# Patient Record
Sex: Female | Born: 1970 | Race: White | Hispanic: No | Marital: Married | Smoking: Never smoker
Health system: Southern US, Community
[De-identification: ages and names within clinical notes are randomized; demographics above are authoritative.]

## PROBLEM LIST (undated history)

## (undated) DIAGNOSIS — R112 Nausea with vomiting, unspecified: Secondary | ICD-10-CM

## (undated) DIAGNOSIS — M1990 Unspecified osteoarthritis, unspecified site: Secondary | ICD-10-CM

## (undated) DIAGNOSIS — K589 Irritable bowel syndrome without diarrhea: Secondary | ICD-10-CM

## (undated) DIAGNOSIS — I10 Essential (primary) hypertension: Secondary | ICD-10-CM

## (undated) DIAGNOSIS — Z9889 Other specified postprocedural states: Secondary | ICD-10-CM

## (undated) DIAGNOSIS — K219 Gastro-esophageal reflux disease without esophagitis: Secondary | ICD-10-CM

## (undated) DIAGNOSIS — E785 Hyperlipidemia, unspecified: Secondary | ICD-10-CM

## (undated) DIAGNOSIS — F32A Depression, unspecified: Secondary | ICD-10-CM

## (undated) DIAGNOSIS — Z8489 Family history of other specified conditions: Secondary | ICD-10-CM

## (undated) DIAGNOSIS — T4145XA Adverse effect of unspecified anesthetic, initial encounter: Secondary | ICD-10-CM

## (undated) DIAGNOSIS — F419 Anxiety disorder, unspecified: Secondary | ICD-10-CM

## (undated) DIAGNOSIS — T8859XA Other complications of anesthesia, initial encounter: Secondary | ICD-10-CM

## (undated) HISTORY — PX: UPPER GASTROINTESTINAL ENDOSCOPY: SHX188

## (undated) HISTORY — PX: CARPAL TUNNEL RELEASE: SHX101

## (undated) HISTORY — PX: COLONOSCOPY: SHX174

## (undated) HISTORY — DX: Hyperlipidemia, unspecified: E78.5

## (undated) HISTORY — PX: TRIGGER FINGER RELEASE: SHX641

## (undated) HISTORY — DX: Gastro-esophageal reflux disease without esophagitis: K21.9

## (undated) HISTORY — DX: Essential (primary) hypertension: I10

## (undated) HISTORY — DX: Irritable bowel syndrome, unspecified: K58.9

---
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
## 1995-12-08 HISTORY — PX: CHOLECYSTECTOMY: SHX55

## 1998-12-07 HISTORY — PX: LIVER SURGERY: SHX698

## 1999-07-15 ENCOUNTER — Encounter: Payer: Self-pay | Admitting: Family Medicine

## 1999-07-15 ENCOUNTER — Ambulatory Visit (HOSPITAL_COMMUNITY): Admission: RE | Admit: 1999-07-15 | Discharge: 1999-07-15 | Payer: Self-pay | Admitting: Family Medicine

## 1999-08-18 ENCOUNTER — Inpatient Hospital Stay (HOSPITAL_COMMUNITY): Admission: RE | Admit: 1999-08-18 | Discharge: 1999-08-25 | Payer: Self-pay | Admitting: *Deleted

## 2000-01-13 ENCOUNTER — Encounter: Admission: RE | Admit: 2000-01-13 | Discharge: 2000-01-13 | Payer: Self-pay | Admitting: *Deleted

## 2001-07-01 ENCOUNTER — Encounter: Payer: Self-pay | Admitting: Internal Medicine

## 2001-07-01 ENCOUNTER — Ambulatory Visit (HOSPITAL_COMMUNITY): Admission: RE | Admit: 2001-07-01 | Discharge: 2001-07-01 | Payer: Self-pay | Admitting: Internal Medicine

## 2001-09-23 ENCOUNTER — Ambulatory Visit (HOSPITAL_COMMUNITY): Admission: RE | Admit: 2001-09-23 | Discharge: 2001-09-23 | Payer: Self-pay | Admitting: Internal Medicine

## 2002-11-24 ENCOUNTER — Encounter (HOSPITAL_COMMUNITY): Admission: RE | Admit: 2002-11-24 | Discharge: 2002-12-24 | Payer: Self-pay | Admitting: Pulmonary Disease

## 2002-12-05 ENCOUNTER — Ambulatory Visit (HOSPITAL_COMMUNITY): Admission: RE | Admit: 2002-12-05 | Discharge: 2002-12-05 | Payer: Self-pay | Admitting: *Deleted

## 2002-12-07 HISTORY — PX: HERNIA REPAIR: SHX51

## 2003-06-01 ENCOUNTER — Ambulatory Visit (HOSPITAL_COMMUNITY): Admission: RE | Admit: 2003-06-01 | Discharge: 2003-06-01 | Payer: Self-pay | Admitting: Pulmonary Disease

## 2004-01-09 ENCOUNTER — Ambulatory Visit (HOSPITAL_COMMUNITY): Admission: RE | Admit: 2004-01-09 | Discharge: 2004-01-09 | Payer: Self-pay | Admitting: *Deleted

## 2004-01-21 ENCOUNTER — Ambulatory Visit (HOSPITAL_COMMUNITY): Admission: RE | Admit: 2004-01-21 | Discharge: 2004-01-21 | Payer: Self-pay | Admitting: *Deleted

## 2004-02-20 ENCOUNTER — Ambulatory Visit (HOSPITAL_COMMUNITY): Admission: RE | Admit: 2004-02-20 | Discharge: 2004-02-20 | Payer: Self-pay | Admitting: General Surgery

## 2004-08-04 ENCOUNTER — Ambulatory Visit (HOSPITAL_COMMUNITY): Admission: RE | Admit: 2004-08-04 | Discharge: 2004-08-04 | Payer: Self-pay | Admitting: General Surgery

## 2004-10-03 ENCOUNTER — Ambulatory Visit (HOSPITAL_COMMUNITY): Admission: RE | Admit: 2004-10-03 | Discharge: 2004-10-03 | Payer: Self-pay | Admitting: Internal Medicine

## 2005-03-21 ENCOUNTER — Emergency Department (HOSPITAL_COMMUNITY): Admission: EM | Admit: 2005-03-21 | Discharge: 2005-03-21 | Payer: Self-pay | Admitting: Family Medicine

## 2005-04-10 ENCOUNTER — Ambulatory Visit (HOSPITAL_COMMUNITY): Admission: RE | Admit: 2005-04-10 | Discharge: 2005-04-10 | Payer: Self-pay | Admitting: Neurosurgery

## 2005-06-23 ENCOUNTER — Ambulatory Visit: Payer: Self-pay | Admitting: Family Medicine

## 2005-07-14 ENCOUNTER — Ambulatory Visit: Payer: Self-pay | Admitting: Family Medicine

## 2005-08-14 ENCOUNTER — Ambulatory Visit: Payer: Self-pay | Admitting: Family Medicine

## 2005-09-08 ENCOUNTER — Encounter: Admission: RE | Admit: 2005-09-08 | Discharge: 2005-09-08 | Payer: Self-pay | Admitting: Neurosurgery

## 2005-09-08 ENCOUNTER — Ambulatory Visit: Payer: Self-pay | Admitting: Psychiatry

## 2005-09-13 ENCOUNTER — Encounter: Admission: RE | Admit: 2005-09-13 | Discharge: 2005-09-13 | Payer: Self-pay | Admitting: Neurosurgery

## 2005-10-27 ENCOUNTER — Encounter: Admission: RE | Admit: 2005-10-27 | Discharge: 2005-10-27 | Payer: Self-pay | Admitting: Neurology

## 2005-10-27 ENCOUNTER — Ambulatory Visit: Payer: Self-pay | Admitting: Family Medicine

## 2005-11-05 ENCOUNTER — Encounter (HOSPITAL_COMMUNITY): Admission: RE | Admit: 2005-11-05 | Discharge: 2005-11-17 | Payer: Self-pay | Admitting: Neurology

## 2005-11-24 ENCOUNTER — Ambulatory Visit: Payer: Self-pay | Admitting: Family Medicine

## 2005-12-09 ENCOUNTER — Encounter (HOSPITAL_COMMUNITY): Admission: RE | Admit: 2005-12-09 | Discharge: 2006-01-08 | Payer: Self-pay | Admitting: Neurology

## 2005-12-22 ENCOUNTER — Ambulatory Visit: Payer: Self-pay | Admitting: Family Medicine

## 2006-01-05 ENCOUNTER — Ambulatory Visit (HOSPITAL_COMMUNITY): Admission: RE | Admit: 2006-01-05 | Discharge: 2006-01-05 | Payer: Self-pay | Admitting: Family Medicine

## 2006-01-07 ENCOUNTER — Ambulatory Visit: Payer: Self-pay | Admitting: Family Medicine

## 2006-01-15 ENCOUNTER — Ambulatory Visit: Payer: Self-pay | Admitting: Family Medicine

## 2006-01-21 ENCOUNTER — Ambulatory Visit: Payer: Self-pay | Admitting: Family Medicine

## 2006-01-25 ENCOUNTER — Encounter: Admission: RE | Admit: 2006-01-25 | Discharge: 2006-01-25 | Payer: Self-pay | Admitting: Neurology

## 2006-03-10 ENCOUNTER — Ambulatory Visit: Payer: Self-pay | Admitting: Family Medicine

## 2006-06-18 ENCOUNTER — Ambulatory Visit: Payer: Self-pay | Admitting: Gastroenterology

## 2006-07-07 ENCOUNTER — Encounter (INDEPENDENT_AMBULATORY_CARE_PROVIDER_SITE_OTHER): Payer: Self-pay | Admitting: Family Medicine

## 2006-07-07 LAB — CONVERTED CEMR LAB: Pap Smear: NORMAL

## 2006-07-16 ENCOUNTER — Ambulatory Visit: Payer: Self-pay | Admitting: Gastroenterology

## 2006-10-08 ENCOUNTER — Ambulatory Visit: Payer: Self-pay | Admitting: Gastroenterology

## 2006-10-13 ENCOUNTER — Ambulatory Visit (HOSPITAL_COMMUNITY): Admission: RE | Admit: 2006-10-13 | Discharge: 2006-10-13 | Payer: Self-pay | Admitting: Gastroenterology

## 2006-10-20 ENCOUNTER — Emergency Department (HOSPITAL_COMMUNITY): Admission: EM | Admit: 2006-10-20 | Discharge: 2006-10-20 | Payer: Self-pay | Admitting: Emergency Medicine

## 2006-11-08 ENCOUNTER — Ambulatory Visit: Payer: Self-pay | Admitting: Family Medicine

## 2006-11-09 ENCOUNTER — Encounter (INDEPENDENT_AMBULATORY_CARE_PROVIDER_SITE_OTHER): Payer: Self-pay | Admitting: Family Medicine

## 2006-11-09 LAB — CONVERTED CEMR LAB: TSH: 1.462 microintl units/mL

## 2006-11-10 ENCOUNTER — Encounter: Payer: Self-pay | Admitting: Family Medicine

## 2006-11-10 DIAGNOSIS — F3289 Other specified depressive episodes: Secondary | ICD-10-CM | POA: Insufficient documentation

## 2006-11-10 DIAGNOSIS — G5600 Carpal tunnel syndrome, unspecified upper limb: Secondary | ICD-10-CM | POA: Insufficient documentation

## 2006-11-10 DIAGNOSIS — F329 Major depressive disorder, single episode, unspecified: Secondary | ICD-10-CM

## 2006-11-10 DIAGNOSIS — K589 Irritable bowel syndrome without diarrhea: Secondary | ICD-10-CM | POA: Insufficient documentation

## 2006-11-10 DIAGNOSIS — J309 Allergic rhinitis, unspecified: Secondary | ICD-10-CM | POA: Insufficient documentation

## 2006-11-10 DIAGNOSIS — F411 Generalized anxiety disorder: Secondary | ICD-10-CM | POA: Insufficient documentation

## 2006-11-10 DIAGNOSIS — E785 Hyperlipidemia, unspecified: Secondary | ICD-10-CM | POA: Insufficient documentation

## 2006-11-10 DIAGNOSIS — I10 Essential (primary) hypertension: Secondary | ICD-10-CM | POA: Insufficient documentation

## 2006-11-10 DIAGNOSIS — G43909 Migraine, unspecified, not intractable, without status migrainosus: Secondary | ICD-10-CM | POA: Insufficient documentation

## 2006-11-10 DIAGNOSIS — K219 Gastro-esophageal reflux disease without esophagitis: Secondary | ICD-10-CM | POA: Insufficient documentation

## 2006-12-03 ENCOUNTER — Ambulatory Visit (HOSPITAL_COMMUNITY): Admission: RE | Admit: 2006-12-03 | Discharge: 2006-12-03 | Payer: Self-pay | Admitting: Gastroenterology

## 2006-12-03 ENCOUNTER — Ambulatory Visit: Payer: Self-pay | Admitting: Gastroenterology

## 2006-12-23 ENCOUNTER — Ambulatory Visit: Payer: Self-pay | Admitting: Gastroenterology

## 2006-12-27 ENCOUNTER — Ambulatory Visit: Payer: Self-pay | Admitting: Family Medicine

## 2007-01-06 ENCOUNTER — Encounter (INDEPENDENT_AMBULATORY_CARE_PROVIDER_SITE_OTHER): Payer: Self-pay | Admitting: Family Medicine

## 2007-01-06 LAB — CONVERTED CEMR LAB
Basophils Absolute: 0.1 10*3/uL (ref 0.0–0.1)
Basophils Relative: 1 % (ref 0–1)
Cholesterol: 206 mg/dL — ABNORMAL HIGH (ref 0–200)
Creatinine, Urine: 218.9 mg/dL
Eosinophils Absolute: 0.2 10*3/uL (ref 0.0–0.7)
Eosinophils Relative: 3 % (ref 0–5)
HCT: 43.6 % (ref 36.0–46.0)
HDL: 40 mg/dL (ref >39)
Hemoglobin: 14.3 g/dL (ref 12.0–15.0)
LDL Cholesterol: 120 mg/dL — ABNORMAL HIGH (ref 0–99)
Lymphocytes Relative: 35 % (ref 12–46)
Lymphs Abs: 2.6 10*3/uL (ref 0.7–3.3)
MCHC: 32.8 g/dL (ref 30.0–36.0)
MCV: 92.2 fL (ref 78.0–100.0)
Microalb Creat Ratio: 5.9 mg/g (ref 0.0–30.0)
Microalb, Ur: 1.3 mg/dL (ref 0.00–1.89)
Monocytes Absolute: 0.5 10*3/uL (ref 0.2–0.7)
Monocytes Relative: 7 % (ref 3–11)
Neutro Abs: 3.9 10*3/uL (ref 1.7–7.7)
Neutrophils Relative %: 54 % (ref 43–77)
Platelets: 303 10*3/uL (ref 150–400)
RBC: 4.73 M/uL (ref 3.87–5.11)
RDW: 12.2 % (ref 11.5–14.0)
Total CHOL/HDL Ratio: 5.2
Triglycerides: 232 mg/dL — ABNORMAL HIGH (ref <150)
VLDL: 46 mg/dL — ABNORMAL HIGH (ref 0–40)
WBC: 7.3 10*3/uL (ref 4.0–10.5)

## 2007-01-10 ENCOUNTER — Ambulatory Visit: Payer: Self-pay | Admitting: Family Medicine

## 2007-01-24 ENCOUNTER — Telehealth (INDEPENDENT_AMBULATORY_CARE_PROVIDER_SITE_OTHER): Payer: Self-pay | Admitting: Family Medicine

## 2007-01-29 IMAGING — CR DG CHEST 2V
2 series · 2 of 2 positions shown · non-contrast
Comparison: none

CLINICAL DATA: Preoperative respiratory exam for carpal tunnel surgery. 
 CHEST - 2 VIEW:

[view not recorded (1 of 2)]
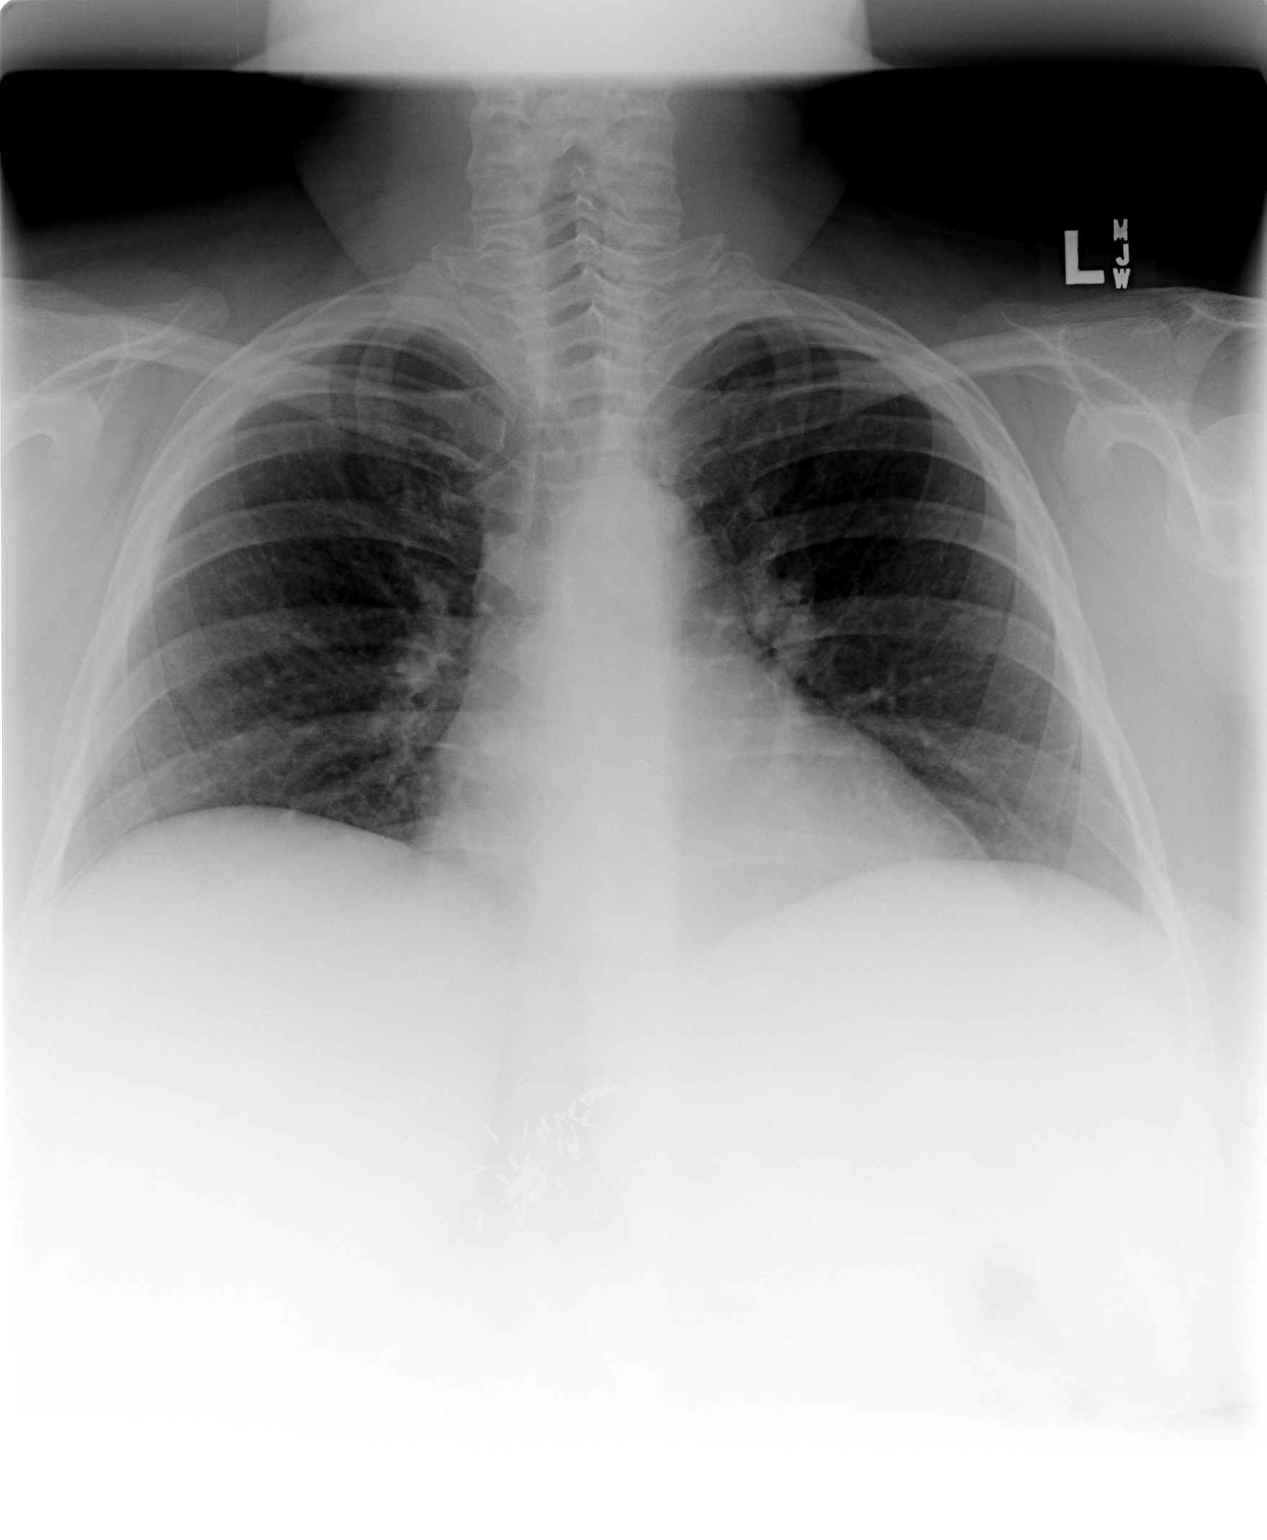

[view not recorded (2 of 2)]
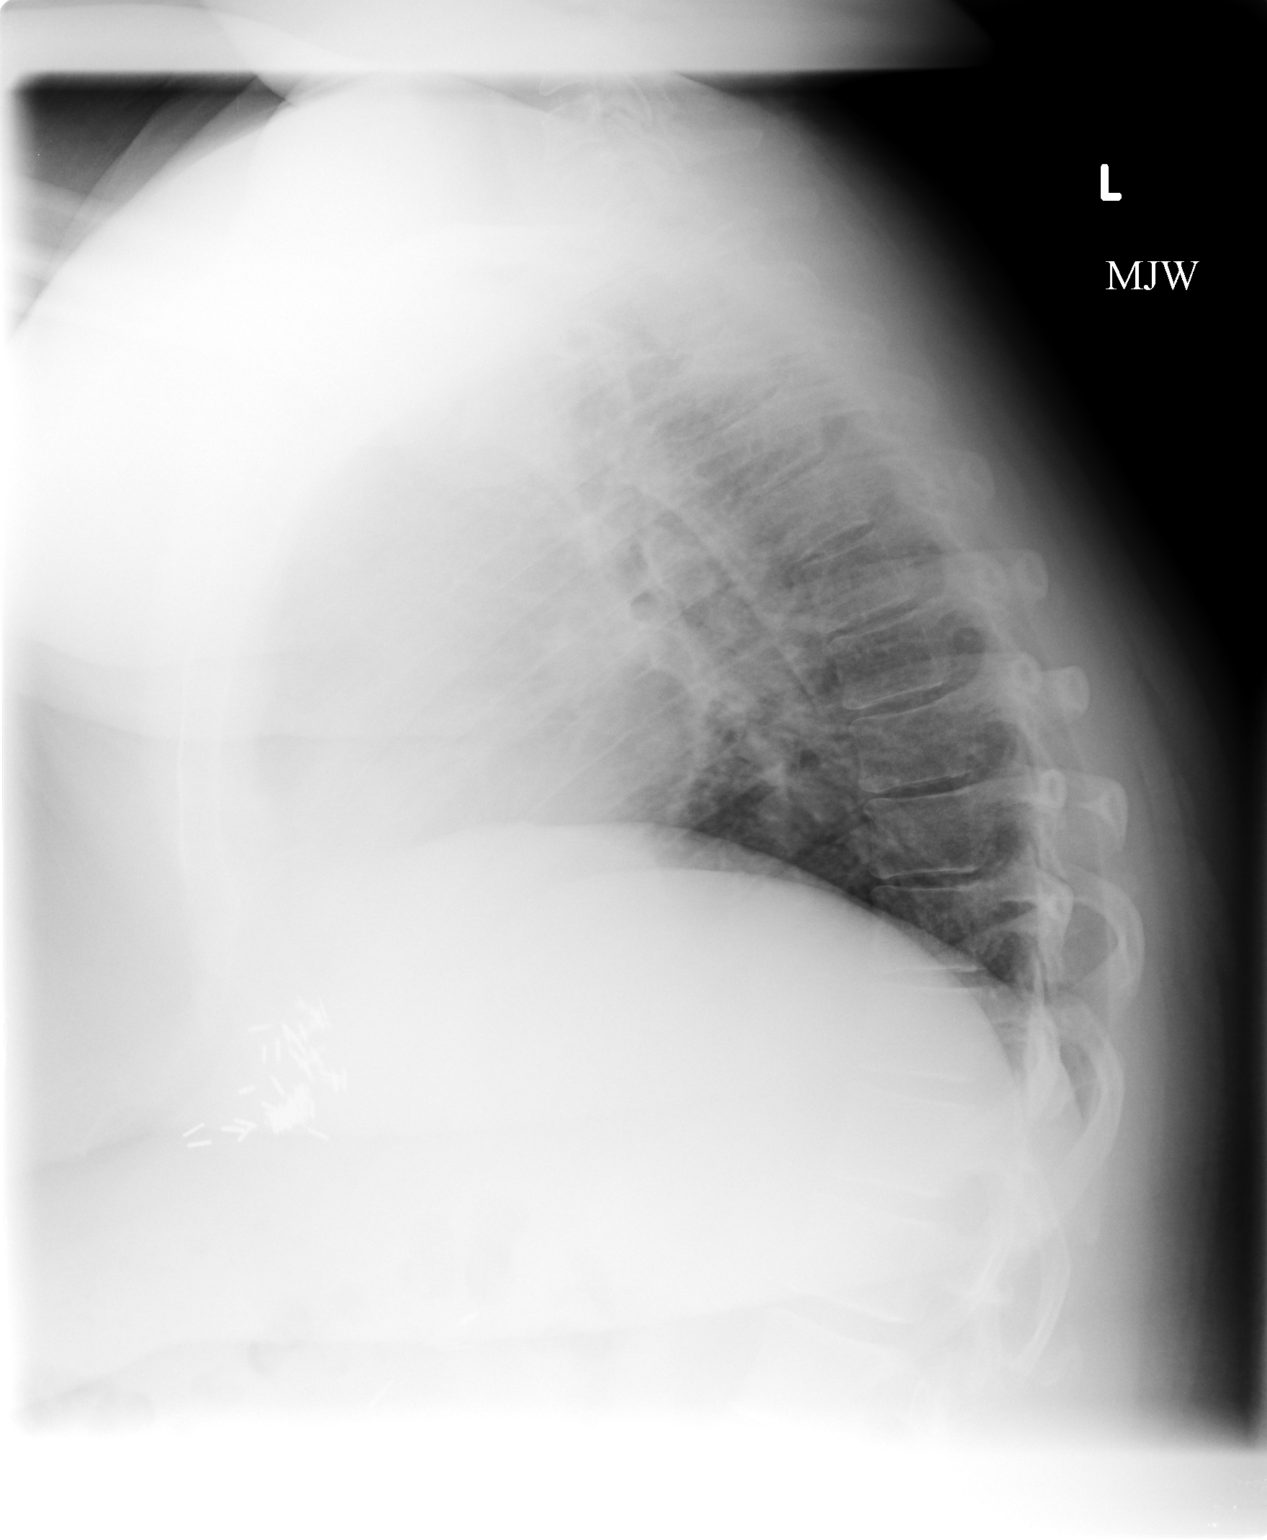

[2 of 2 positions shown; findings below may reference images not displayed]

FINDINGS: The cardiomediastinal silhouette is unremarkable.  The lungs appear clear.  The visualized bony thorax appears intact.
IMPRESSION: Normal chest.

## 2007-02-21 ENCOUNTER — Ambulatory Visit: Payer: Self-pay | Admitting: Family Medicine

## 2007-02-21 LAB — CONVERTED CEMR LAB
Cholesterol, target level: 200 mg/dL
HDL goal, serum: 40 mg/dL
LDL Goal: 100 mg/dL

## 2007-02-22 ENCOUNTER — Encounter (INDEPENDENT_AMBULATORY_CARE_PROVIDER_SITE_OTHER): Payer: Self-pay | Admitting: Family Medicine

## 2007-02-23 LAB — CONVERTED CEMR LAB
ALT: 28 units/L (ref 0–35)
AST: 21 units/L (ref 0–37)
Albumin: 4.1 g/dL (ref 3.5–5.2)
Alkaline Phosphatase: 60 units/L (ref 39–117)
BUN: 14 mg/dL (ref 6–23)
CO2: 18 meq/L — ABNORMAL LOW (ref 19–32)
Calcium: 9.2 mg/dL (ref 8.4–10.5)
Chloride: 106 meq/L (ref 96–112)
Creatinine, Ser: 0.84 mg/dL (ref 0.40–1.20)
Glucose, Bld: 126 mg/dL — ABNORMAL HIGH (ref 70–99)
Potassium: 4.3 meq/L (ref 3.5–5.3)
Sodium: 139 meq/L (ref 135–145)
Total Bilirubin: 0.4 mg/dL (ref 0.3–1.2)
Total Protein: 7.1 g/dL (ref 6.0–8.3)

## 2007-02-28 ENCOUNTER — Encounter (INDEPENDENT_AMBULATORY_CARE_PROVIDER_SITE_OTHER): Payer: Self-pay | Admitting: Family Medicine

## 2007-03-15 ENCOUNTER — Encounter (INDEPENDENT_AMBULATORY_CARE_PROVIDER_SITE_OTHER): Payer: Self-pay | Admitting: Family Medicine

## 2007-03-25 ENCOUNTER — Telehealth (INDEPENDENT_AMBULATORY_CARE_PROVIDER_SITE_OTHER): Payer: Self-pay | Admitting: *Deleted

## 2007-03-29 ENCOUNTER — Encounter (INDEPENDENT_AMBULATORY_CARE_PROVIDER_SITE_OTHER): Payer: Self-pay | Admitting: Family Medicine

## 2007-04-05 ENCOUNTER — Ambulatory Visit: Payer: Self-pay | Admitting: Family Medicine

## 2007-04-05 LAB — CONVERTED CEMR LAB: Blood Glucose, Fingerstick: 135

## 2007-04-07 ENCOUNTER — Encounter (INDEPENDENT_AMBULATORY_CARE_PROVIDER_SITE_OTHER): Payer: Self-pay | Admitting: Family Medicine

## 2007-06-06 ENCOUNTER — Ambulatory Visit: Payer: Self-pay | Admitting: Family Medicine

## 2007-06-06 ENCOUNTER — Ambulatory Visit (HOSPITAL_COMMUNITY): Admission: RE | Admit: 2007-06-06 | Discharge: 2007-06-06 | Payer: Self-pay | Admitting: Family Medicine

## 2007-06-06 LAB — CONVERTED CEMR LAB
ALT: 28 U/L
AST: 26 U/L
Albumin: 3.8 g/dL
Alkaline Phosphatase: 62 U/L
Amylase: 40 U/L
BUN: 11 mg/dL
Basophils Absolute: 0 10*3/uL
Basophils Relative: 0 %
Beta hcg, urine, semiquantitative: NEGATIVE
CO2: 26 meq/L
Calcium: 9.5 mg/dL
Chloride: 101 meq/L
Creatinine, Ser: 0.75 mg/dL
Eosinophils Absolute: 0.2 10*3/uL
Eosinophils Relative: 2 %
Glucose, Bld: 195 mg/dL — ABNORMAL HIGH
Glucose, Urine, Semiquant: NEGATIVE
HCT: 42.1 %
Hemoglobin: 14.7 g/dL
Ketones, urine, test strip: NEGATIVE
Lipase: 18 U/L
Lymphocytes Relative: 26 %
Lymphs Abs: 3 10*3/uL
MCHC: 34.9 g/dL
MCV: 86.8 fL
Monocytes Absolute: 0.7 10*3/uL
Monocytes Relative: 6 %
Neutro Abs: 7.5 10*3/uL
Neutrophils Relative %: 66 %
Platelets: 289 10*3/uL
Potassium: 4.3 meq/L
RBC: 4.86 M/uL
RDW: 12.2 %
Sodium: 135 meq/L
Total Bilirubin: 0.6 mg/dL
Total Protein: 6.9 g/dL
WBC: 11.4 10*3/uL — ABNORMAL HIGH

## 2007-06-07 ENCOUNTER — Telehealth (INDEPENDENT_AMBULATORY_CARE_PROVIDER_SITE_OTHER): Payer: Self-pay | Admitting: *Deleted

## 2007-06-08 ENCOUNTER — Ambulatory Visit: Payer: Self-pay | Admitting: Family Medicine

## 2007-06-13 ENCOUNTER — Telehealth (INDEPENDENT_AMBULATORY_CARE_PROVIDER_SITE_OTHER): Payer: Self-pay | Admitting: *Deleted

## 2007-06-17 ENCOUNTER — Encounter (INDEPENDENT_AMBULATORY_CARE_PROVIDER_SITE_OTHER): Payer: Self-pay | Admitting: Family Medicine

## 2007-06-19 ENCOUNTER — Encounter (INDEPENDENT_AMBULATORY_CARE_PROVIDER_SITE_OTHER): Payer: Self-pay | Admitting: Family Medicine

## 2007-06-20 ENCOUNTER — Encounter (INDEPENDENT_AMBULATORY_CARE_PROVIDER_SITE_OTHER): Payer: Self-pay | Admitting: Family Medicine

## 2007-06-21 ENCOUNTER — Telehealth (INDEPENDENT_AMBULATORY_CARE_PROVIDER_SITE_OTHER): Payer: Self-pay | Admitting: Family Medicine

## 2007-06-22 ENCOUNTER — Ambulatory Visit: Payer: Self-pay | Admitting: Gastroenterology

## 2007-06-22 ENCOUNTER — Telehealth (INDEPENDENT_AMBULATORY_CARE_PROVIDER_SITE_OTHER): Payer: Self-pay | Admitting: Family Medicine

## 2007-06-22 ENCOUNTER — Encounter (INDEPENDENT_AMBULATORY_CARE_PROVIDER_SITE_OTHER): Payer: Self-pay | Admitting: Family Medicine

## 2007-07-13 ENCOUNTER — Ambulatory Visit: Payer: Self-pay | Admitting: Gastroenterology

## 2007-07-18 ENCOUNTER — Ambulatory Visit: Payer: Self-pay | Admitting: Family Medicine

## 2007-07-19 ENCOUNTER — Telehealth (INDEPENDENT_AMBULATORY_CARE_PROVIDER_SITE_OTHER): Payer: Self-pay | Admitting: *Deleted

## 2007-07-19 ENCOUNTER — Encounter (INDEPENDENT_AMBULATORY_CARE_PROVIDER_SITE_OTHER): Payer: Self-pay | Admitting: Family Medicine

## 2007-07-22 ENCOUNTER — Telehealth (INDEPENDENT_AMBULATORY_CARE_PROVIDER_SITE_OTHER): Payer: Self-pay | Admitting: *Deleted

## 2007-07-22 ENCOUNTER — Encounter (HOSPITAL_COMMUNITY): Admission: RE | Admit: 2007-07-22 | Discharge: 2007-08-21 | Payer: Self-pay | Admitting: Family Medicine

## 2007-07-25 ENCOUNTER — Encounter (INDEPENDENT_AMBULATORY_CARE_PROVIDER_SITE_OTHER): Payer: Self-pay | Admitting: Family Medicine

## 2007-08-13 ENCOUNTER — Encounter (INDEPENDENT_AMBULATORY_CARE_PROVIDER_SITE_OTHER): Payer: Self-pay | Admitting: Family Medicine

## 2007-08-15 ENCOUNTER — Encounter (INDEPENDENT_AMBULATORY_CARE_PROVIDER_SITE_OTHER): Payer: Self-pay | Admitting: Family Medicine

## 2007-08-19 ENCOUNTER — Encounter (INDEPENDENT_AMBULATORY_CARE_PROVIDER_SITE_OTHER): Payer: Self-pay | Admitting: Family Medicine

## 2007-08-22 ENCOUNTER — Encounter (INDEPENDENT_AMBULATORY_CARE_PROVIDER_SITE_OTHER): Payer: Self-pay | Admitting: Family Medicine

## 2007-08-23 ENCOUNTER — Ambulatory Visit: Payer: Self-pay | Admitting: Family Medicine

## 2007-08-23 DIAGNOSIS — E1165 Type 2 diabetes mellitus with hyperglycemia: Secondary | ICD-10-CM

## 2007-08-23 DIAGNOSIS — IMO0001 Reserved for inherently not codable concepts without codable children: Secondary | ICD-10-CM | POA: Insufficient documentation

## 2007-08-23 LAB — CONVERTED CEMR LAB: Hgb A1c MFr Bld: 9.1 %

## 2007-08-29 ENCOUNTER — Telehealth (INDEPENDENT_AMBULATORY_CARE_PROVIDER_SITE_OTHER): Payer: Self-pay | Admitting: Family Medicine

## 2007-09-09 ENCOUNTER — Telehealth (INDEPENDENT_AMBULATORY_CARE_PROVIDER_SITE_OTHER): Payer: Self-pay | Admitting: Family Medicine

## 2007-09-12 ENCOUNTER — Encounter (INDEPENDENT_AMBULATORY_CARE_PROVIDER_SITE_OTHER): Payer: Self-pay | Admitting: Family Medicine

## 2007-09-30 ENCOUNTER — Encounter (INDEPENDENT_AMBULATORY_CARE_PROVIDER_SITE_OTHER): Payer: Self-pay | Admitting: Family Medicine

## 2007-10-21 ENCOUNTER — Telehealth (INDEPENDENT_AMBULATORY_CARE_PROVIDER_SITE_OTHER): Payer: Self-pay | Admitting: *Deleted

## 2007-10-25 ENCOUNTER — Ambulatory Visit: Payer: Self-pay | Admitting: Family Medicine

## 2007-11-21 ENCOUNTER — Telehealth (INDEPENDENT_AMBULATORY_CARE_PROVIDER_SITE_OTHER): Payer: Self-pay | Admitting: *Deleted

## 2007-12-06 ENCOUNTER — Telehealth (INDEPENDENT_AMBULATORY_CARE_PROVIDER_SITE_OTHER): Payer: Self-pay | Admitting: *Deleted

## 2007-12-06 ENCOUNTER — Ambulatory Visit: Payer: Self-pay | Admitting: Family Medicine

## 2007-12-08 HISTORY — PX: CERVICAL CONE BIOPSY: SUR198

## 2007-12-08 HISTORY — PX: DILATION AND CURETTAGE OF UTERUS: SHX78

## 2007-12-16 ENCOUNTER — Encounter (INDEPENDENT_AMBULATORY_CARE_PROVIDER_SITE_OTHER): Payer: Self-pay | Admitting: Family Medicine

## 2007-12-23 ENCOUNTER — Ambulatory Visit: Payer: Self-pay | Admitting: Family Medicine

## 2008-02-07 ENCOUNTER — Encounter (INDEPENDENT_AMBULATORY_CARE_PROVIDER_SITE_OTHER): Payer: Self-pay | Admitting: Family Medicine

## 2008-02-08 ENCOUNTER — Telehealth (INDEPENDENT_AMBULATORY_CARE_PROVIDER_SITE_OTHER): Payer: Self-pay | Admitting: *Deleted

## 2008-02-08 LAB — CONVERTED CEMR LAB
Albumin: 4.2 g/dL (ref 3.5–5.2)
BUN: 9 mg/dL (ref 6–23)
Calcium: 9.6 mg/dL (ref 8.4–10.5)
Chloride: 102 meq/L (ref 96–112)
Creatinine, Urine: 451.3 mg/dL
Glucose, Bld: 120 mg/dL — ABNORMAL HIGH (ref 70–99)
HDL: 29 mg/dL — ABNORMAL LOW (ref >39)
Lymphs Abs: 2.1 10*3/uL (ref 0.7–4.0)
Microalb, Ur: 2.45 mg/dL — ABNORMAL HIGH (ref 0.00–1.89)
Monocytes Relative: 8 % (ref 3–12)
Neutro Abs: 5.8 10*3/uL (ref 1.7–7.7)
Neutrophils Relative %: 65 % (ref 43–77)
Potassium: 4.6 meq/L (ref 3.5–5.3)
RBC: 5.13 M/uL — ABNORMAL HIGH (ref 3.87–5.11)
TSH: 2.038 microintl units/mL (ref 0.350–5.50)
Triglycerides: 177 mg/dL — ABNORMAL HIGH (ref <150)
WBC: 8.8 10*3/uL (ref 4.0–10.5)

## 2008-03-29 ENCOUNTER — Ambulatory Visit: Payer: Self-pay | Admitting: Family Medicine

## 2008-06-22 ENCOUNTER — Ambulatory Visit: Payer: Self-pay | Admitting: Family Medicine

## 2008-06-22 LAB — CONVERTED CEMR LAB
Blood Glucose, Fasting: 172 mg/dL
Hgb A1c MFr Bld: 7.3 %

## 2008-06-23 ENCOUNTER — Encounter (INDEPENDENT_AMBULATORY_CARE_PROVIDER_SITE_OTHER): Payer: Self-pay | Admitting: Family Medicine

## 2008-06-25 LAB — CONVERTED CEMR LAB
ALT: 22 units/L (ref 0–35)
AST: 22 units/L (ref 0–37)
Alkaline Phosphatase: 67 units/L (ref 39–117)
Calcium: 9.1 mg/dL (ref 8.4–10.5)
Chloride: 103 meq/L (ref 96–112)
Creatinine, Ser: 0.67 mg/dL (ref 0.40–1.20)
LDL Cholesterol: 128 mg/dL — ABNORMAL HIGH (ref 0–99)
Potassium: 4.4 meq/L (ref 3.5–5.3)
Total CHOL/HDL Ratio: 5.5

## 2008-06-27 ENCOUNTER — Telehealth (INDEPENDENT_AMBULATORY_CARE_PROVIDER_SITE_OTHER): Payer: Self-pay | Admitting: *Deleted

## 2008-07-28 ENCOUNTER — Encounter (INDEPENDENT_AMBULATORY_CARE_PROVIDER_SITE_OTHER): Payer: Self-pay | Admitting: Family Medicine

## 2008-09-03 ENCOUNTER — Encounter (INDEPENDENT_AMBULATORY_CARE_PROVIDER_SITE_OTHER): Payer: Self-pay | Admitting: Family Medicine

## 2008-09-10 ENCOUNTER — Ambulatory Visit: Payer: Self-pay | Admitting: Family Medicine

## 2008-09-10 LAB — CONVERTED CEMR LAB: Inflenza A Ag: NEGATIVE

## 2008-09-21 ENCOUNTER — Ambulatory Visit: Payer: Self-pay | Admitting: Family Medicine

## 2008-09-21 LAB — CONVERTED CEMR LAB: Glucose, Bld: 248 mg/dL

## 2008-09-22 ENCOUNTER — Encounter (INDEPENDENT_AMBULATORY_CARE_PROVIDER_SITE_OTHER): Payer: Self-pay | Admitting: Family Medicine

## 2008-09-24 LAB — CONVERTED CEMR LAB
ALT: 29 units/L (ref 0–35)
AST: 23 units/L (ref 0–37)
Albumin: 4 g/dL (ref 3.5–5.2)
Alkaline Phosphatase: 62 units/L (ref 39–117)
Basophils Absolute: 0.1 10*3/uL (ref 0.0–0.1)
Basophils Relative: 1 % (ref 0–1)
Calcium: 9.3 mg/dL (ref 8.4–10.5)
Chloride: 103 meq/L (ref 96–112)
Eosinophils Absolute: 0.3 10*3/uL (ref 0.0–0.7)
LDL Cholesterol: 136 mg/dL — ABNORMAL HIGH (ref 0–99)
MCHC: 32.5 g/dL (ref 30.0–36.0)
Monocytes Relative: 7 % (ref 3–12)
Neutro Abs: 3.5 10*3/uL (ref 1.7–7.7)
Neutrophils Relative %: 49 % (ref 43–77)
Platelets: 274 10*3/uL (ref 150–400)
Potassium: 4.2 meq/L (ref 3.5–5.3)
RBC: 4.85 M/uL (ref 3.87–5.11)
RDW: 12.5 % (ref 11.5–15.5)
Sodium: 138 meq/L (ref 135–145)
TSH: 2.307 microintl units/mL (ref 0.350–4.50)

## 2008-10-05 ENCOUNTER — Ambulatory Visit: Payer: Self-pay | Admitting: Family Medicine

## 2008-10-31 ENCOUNTER — Ambulatory Visit: Payer: Self-pay | Admitting: Family Medicine

## 2008-11-03 ENCOUNTER — Emergency Department (HOSPITAL_COMMUNITY): Admission: EM | Admit: 2008-11-03 | Discharge: 2008-11-03 | Payer: Self-pay | Admitting: Emergency Medicine

## 2008-11-26 ENCOUNTER — Encounter (INDEPENDENT_AMBULATORY_CARE_PROVIDER_SITE_OTHER): Payer: Self-pay | Admitting: Family Medicine

## 2008-12-10 ENCOUNTER — Ambulatory Visit: Payer: Self-pay | Admitting: Family Medicine

## 2008-12-10 LAB — CONVERTED CEMR LAB: Rapid Strep: NEGATIVE

## 2008-12-11 ENCOUNTER — Encounter (INDEPENDENT_AMBULATORY_CARE_PROVIDER_SITE_OTHER): Payer: Self-pay | Admitting: Family Medicine

## 2009-01-03 ENCOUNTER — Ambulatory Visit: Payer: Self-pay | Admitting: Family Medicine

## 2009-01-03 LAB — CONVERTED CEMR LAB: Hgb A1c MFr Bld: 6.4 %

## 2009-01-08 ENCOUNTER — Encounter (INDEPENDENT_AMBULATORY_CARE_PROVIDER_SITE_OTHER): Payer: Self-pay | Admitting: Family Medicine

## 2009-01-31 LAB — CONVERTED CEMR LAB
ALT: 23 units/L (ref 0–35)
Alkaline Phosphatase: 70 units/L (ref 39–117)
CO2: 25 meq/L (ref 19–32)
Cholesterol: 200 mg/dL (ref 0–200)
Creatinine, Ser: 0.76 mg/dL (ref 0.40–1.20)
Glucose, Bld: 101 mg/dL — ABNORMAL HIGH (ref 70–99)
LDL Cholesterol: 131 mg/dL — ABNORMAL HIGH (ref 0–99)
Sodium: 140 meq/L (ref 135–145)
Total Bilirubin: 0.5 mg/dL (ref 0.3–1.2)
Total CHOL/HDL Ratio: 4.9
Total Protein: 6.9 g/dL (ref 6.0–8.3)
Triglycerides: 138 mg/dL (ref <150)
VLDL: 28 mg/dL (ref 0–40)

## 2009-02-26 ENCOUNTER — Telehealth: Payer: Self-pay | Admitting: Gastroenterology

## 2009-03-07 ENCOUNTER — Encounter: Payer: Self-pay | Admitting: Urgent Care

## 2009-04-01 ENCOUNTER — Telehealth (INDEPENDENT_AMBULATORY_CARE_PROVIDER_SITE_OTHER): Payer: Self-pay | Admitting: *Deleted

## 2009-04-09 ENCOUNTER — Telehealth (INDEPENDENT_AMBULATORY_CARE_PROVIDER_SITE_OTHER): Payer: Self-pay | Admitting: *Deleted

## 2009-04-10 ENCOUNTER — Ambulatory Visit: Payer: Self-pay | Admitting: Family Medicine

## 2009-04-10 DIAGNOSIS — K921 Melena: Secondary | ICD-10-CM | POA: Insufficient documentation

## 2009-04-10 DIAGNOSIS — T2112XA Burn of first degree of abdominal wall, initial encounter: Secondary | ICD-10-CM | POA: Insufficient documentation

## 2009-04-10 LAB — CONVERTED CEMR LAB: Hgb A1c MFr Bld: 6.2 %

## 2009-04-11 LAB — CONVERTED CEMR LAB
Albumin: 4 g/dL (ref 3.5–5.2)
Alkaline Phosphatase: 64 units/L (ref 39–117)
BUN: 13 mg/dL (ref 6–23)
CO2: 24 meq/L (ref 19–32)
Calcium: 9.2 mg/dL (ref 8.4–10.5)
Chloride: 104 meq/L (ref 96–112)
Cholesterol: 147 mg/dL (ref 0–200)
Creatinine, Urine: 46.8 mg/dL
Eosinophils Relative: 3 % (ref 0–5)
Glucose, Bld: 80 mg/dL (ref 70–99)
HCT: 42.3 % (ref 36.0–46.0)
HDL: 39 mg/dL — ABNORMAL LOW (ref >39)
Hemoglobin: 13.7 g/dL (ref 12.0–15.0)
LDL Cholesterol: 84 mg/dL (ref 0–99)
Lymphocytes Relative: 33 % (ref 12–46)
Lymphs Abs: 3.3 10*3/uL (ref 0.7–4.0)
Microalb Creat Ratio: 10.7 mg/g (ref 0.0–30.0)
Monocytes Absolute: 0.7 10*3/uL (ref 0.1–1.0)
Monocytes Relative: 7 % (ref 3–12)
Neutro Abs: 5.6 10*3/uL (ref 1.7–7.7)
Potassium: 4 meq/L (ref 3.5–5.3)
RBC: 4.7 M/uL (ref 3.87–5.11)
Sodium: 136 meq/L (ref 135–145)
Total Protein: 6.8 g/dL (ref 6.0–8.3)
Triglycerides: 120 mg/dL (ref <150)

## 2009-04-22 ENCOUNTER — Encounter (INDEPENDENT_AMBULATORY_CARE_PROVIDER_SITE_OTHER): Payer: Self-pay | Admitting: *Deleted

## 2009-05-22 ENCOUNTER — Ambulatory Visit: Payer: Self-pay | Admitting: Internal Medicine

## 2009-05-22 DIAGNOSIS — R197 Diarrhea, unspecified: Secondary | ICD-10-CM | POA: Insufficient documentation

## 2009-05-22 LAB — CONVERTED CEMR LAB
Albumin: 3.9 g/dL (ref 3.5–5.2)
BUN: 8 mg/dL (ref 6–23)
Basophils Relative: 0 % (ref 0–1)
CO2: 26 meq/L (ref 19–32)
Eosinophils Relative: 2 % (ref 0–5)
Glucose, Bld: 170 mg/dL — ABNORMAL HIGH (ref 70–99)
HCT: 42.3 % (ref 36.0–46.0)
Hemoglobin: 15.1 g/dL — ABNORMAL HIGH (ref 12.0–15.0)
Lymphocytes Relative: 20 % (ref 12–46)
MCHC: 35.8 g/dL (ref 30.0–36.0)
Monocytes Absolute: 0.6 10*3/uL (ref 0.1–1.0)
Monocytes Relative: 8 % (ref 3–12)
Neutro Abs: 5.2 10*3/uL (ref 1.7–7.7)
Potassium: 4.1 meq/L (ref 3.5–5.3)
RBC: 4.98 M/uL (ref 3.87–5.11)
Sodium: 137 meq/L (ref 135–145)
Total Protein: 6.9 g/dL (ref 6.0–8.3)

## 2009-06-13 ENCOUNTER — Ambulatory Visit: Payer: Self-pay | Admitting: Family Medicine

## 2009-06-13 ENCOUNTER — Encounter (INDEPENDENT_AMBULATORY_CARE_PROVIDER_SITE_OTHER): Payer: Self-pay | Admitting: *Deleted

## 2009-06-13 DIAGNOSIS — R1013 Epigastric pain: Secondary | ICD-10-CM | POA: Insufficient documentation

## 2009-06-26 LAB — CONVERTED CEMR LAB
ALT: 36 units/L — ABNORMAL HIGH (ref 0–35)
AST: 40 units/L — ABNORMAL HIGH (ref 0–37)
Albumin: 4.1 g/dL (ref 3.5–5.2)
Alkaline Phosphatase: 58 units/L (ref 39–117)
Potassium: 4.8 meq/L (ref 3.5–5.3)
Sodium: 140 meq/L (ref 135–145)
Total Protein: 6.9 g/dL (ref 6.0–8.3)

## 2009-07-04 ENCOUNTER — Encounter (INDEPENDENT_AMBULATORY_CARE_PROVIDER_SITE_OTHER): Payer: Self-pay | Admitting: Family Medicine

## 2009-07-08 ENCOUNTER — Telehealth (INDEPENDENT_AMBULATORY_CARE_PROVIDER_SITE_OTHER): Payer: Self-pay | Admitting: Family Medicine

## 2009-07-15 ENCOUNTER — Telehealth (INDEPENDENT_AMBULATORY_CARE_PROVIDER_SITE_OTHER): Payer: Self-pay | Admitting: *Deleted

## 2009-07-15 ENCOUNTER — Ambulatory Visit: Payer: Self-pay | Admitting: Family Medicine

## 2009-07-19 ENCOUNTER — Ambulatory Visit (HOSPITAL_COMMUNITY): Admission: RE | Admit: 2009-07-19 | Discharge: 2009-07-19 | Payer: Self-pay | Admitting: Family Medicine

## 2009-07-22 ENCOUNTER — Telehealth (INDEPENDENT_AMBULATORY_CARE_PROVIDER_SITE_OTHER): Payer: Self-pay | Admitting: Family Medicine

## 2009-07-24 ENCOUNTER — Ambulatory Visit: Payer: Self-pay | Admitting: Gastroenterology

## 2009-07-24 DIAGNOSIS — R7401 Elevation of levels of liver transaminase levels: Secondary | ICD-10-CM | POA: Insufficient documentation

## 2009-07-24 DIAGNOSIS — R740 Nonspecific elevation of levels of transaminase and lactic acid dehydrogenase [LDH]: Secondary | ICD-10-CM

## 2009-07-25 LAB — CONVERTED CEMR LAB
ALT: 31 units/L (ref 0–35)
AST: 24 units/L (ref 0–37)
Albumin: 4.3 g/dL (ref 3.5–5.2)
BUN: 10 mg/dL (ref 6–23)
CO2: 23 meq/L (ref 19–32)
Calcium: 9.6 mg/dL (ref 8.4–10.5)
Chloride: 99 meq/L (ref 96–112)
Creatinine, Ser: 0.73 mg/dL (ref 0.40–1.20)
Glucose, Bld: 126 mg/dL — ABNORMAL HIGH (ref 70–99)
Potassium: 3.8 meq/L (ref 3.5–5.3)
Sodium: 138 meq/L (ref 135–145)
Total Bilirubin: 0.5 mg/dL (ref 0.3–1.2)
Total Protein: 6.8 g/dL (ref 6.0–8.3)

## 2009-08-28 ENCOUNTER — Ambulatory Visit: Payer: Self-pay | Admitting: Gastroenterology

## 2009-08-29 ENCOUNTER — Encounter: Payer: Self-pay | Admitting: Gastroenterology

## 2009-09-04 ENCOUNTER — Telehealth: Payer: Self-pay | Admitting: Gastroenterology

## 2009-09-04 ENCOUNTER — Ambulatory Visit (HOSPITAL_COMMUNITY): Admission: RE | Admit: 2009-09-04 | Discharge: 2009-09-04 | Payer: Self-pay | Admitting: Gastroenterology

## 2009-09-04 ENCOUNTER — Ambulatory Visit: Payer: Self-pay | Admitting: Gastroenterology

## 2009-09-06 ENCOUNTER — Ambulatory Visit (HOSPITAL_COMMUNITY): Admission: RE | Admit: 2009-09-06 | Discharge: 2009-09-06 | Payer: Self-pay | Admitting: Gastroenterology

## 2009-09-06 ENCOUNTER — Encounter: Payer: Self-pay | Admitting: Gastroenterology

## 2009-09-06 ENCOUNTER — Ambulatory Visit: Payer: Self-pay | Admitting: Gastroenterology

## 2009-09-09 ENCOUNTER — Telehealth (INDEPENDENT_AMBULATORY_CARE_PROVIDER_SITE_OTHER): Payer: Self-pay

## 2009-09-09 ENCOUNTER — Telehealth: Payer: Self-pay | Admitting: Gastroenterology

## 2010-05-24 ENCOUNTER — Inpatient Hospital Stay (HOSPITAL_COMMUNITY): Admission: EM | Admit: 2010-05-24 | Discharge: 2010-05-26 | Payer: Self-pay | Admitting: Emergency Medicine

## 2010-05-25 ENCOUNTER — Ambulatory Visit: Payer: Self-pay | Admitting: Gastroenterology

## 2010-05-26 ENCOUNTER — Ambulatory Visit: Payer: Self-pay | Admitting: Gastroenterology

## 2010-05-27 ENCOUNTER — Telehealth (INDEPENDENT_AMBULATORY_CARE_PROVIDER_SITE_OTHER): Payer: Self-pay

## 2010-07-24 ENCOUNTER — Encounter: Payer: Self-pay | Admitting: Gastroenterology

## 2010-10-22 ENCOUNTER — Emergency Department (HOSPITAL_COMMUNITY): Admission: EM | Admit: 2010-10-22 | Discharge: 2010-10-22 | Payer: Self-pay | Admitting: Emergency Medicine

## 2010-12-28 ENCOUNTER — Encounter: Payer: Self-pay | Admitting: Family Medicine

## 2011-01-06 NOTE — Progress Notes (Signed)
Summary: swallowing difficultys  Phone Note Call from Patient Call back at [phone]   Caller: Patient Summary of Call: pt called- was D/C from hospital yesterday and was having no problems and eating ok when she left. Pt stated that last night she was eating and it felt like the food stopped in her throat and didnt go all the way down. she is not having any pain with this. She is concerned  that it may just be her worrying too much or her throat is just sore from NG tube. She also stated that she didnt feel this way when she left the hospital.  pt is requesting  to talk to [facility] to get some reassurance that she is going to be ok. please advise Initial call taken by: Hendricks Limes LPN,  May 27, 2010 9:25 AM     Appended Document: swallowing difficultys PLEASE CALL PT. SHE SHOULD FOLLOW A SOFT DIET FOR 7 DAYS. tHE ng TUBE LIKLEY IRRITATED HER THROAT AND ESOPHAGUS. No hamburgers, steak, pork chops, or chicken unless they are shredded or chopped and moist. Avoid raw veggies, watermelon, and cantelope.  Appended Document: swallowing difficultys tried to call p-LMOM  Appended Document: swallowing difficultys pt aware

## 2011-01-06 NOTE — Letter (Signed)
Summary: CONSULTATION  CONSULTATION   Imported By: Rexene Alberts 07/24/2010 15:59:37  _____________________________________________________________________  External Attachment:    Type:   Image     Comment:   External Document

## 2011-02-17 LAB — DIFFERENTIAL
Lymphocytes Relative: 38 % (ref 12–46)
Monocytes Absolute: 0.7 10*3/uL (ref 0.1–1.0)
Monocytes Relative: 8 % (ref 3–12)
Neutro Abs: 4.9 10*3/uL (ref 1.7–7.7)

## 2011-02-17 LAB — BASIC METABOLIC PANEL
BUN: 12 mg/dL (ref 6–23)
Calcium: 10.3 mg/dL (ref 8.4–10.5)
GFR calc non Af Amer: >60 mL/min (ref >60)
Glucose, Bld: 301 mg/dL — ABNORMAL HIGH (ref 70–99)

## 2011-02-17 LAB — CBC
HCT: 43.3 % (ref 36.0–46.0)
MCHC: 34.2 g/dL (ref 30.0–36.0)
RDW: 12.2 % (ref 11.5–15.5)

## 2011-02-22 LAB — COMPREHENSIVE METABOLIC PANEL
ALT: 54 U/L — ABNORMAL HIGH (ref 0–35)
AST: 96 U/L — ABNORMAL HIGH (ref 0–37)
Albumin: 3.3 g/dL — ABNORMAL LOW (ref 3.5–5.2)
BUN: 4 mg/dL — ABNORMAL LOW (ref 6–23)
CO2: 25 mEq/L (ref 19–32)
Chloride: 105 mEq/L (ref 96–112)
Creatinine, Ser: 0.69 mg/dL (ref 0.4–1.2)
Creatinine, Ser: 0.81 mg/dL (ref 0.4–1.2)
GFR calc Af Amer: >60 mL/min (ref >60)
GFR calc non Af Amer: >60 mL/min (ref >60)
Glucose, Bld: 219 mg/dL — ABNORMAL HIGH (ref 70–99)
Glucose, Bld: 240 mg/dL — ABNORMAL HIGH (ref 70–99)
Sodium: 137 mEq/L (ref 135–145)
Total Bilirubin: 0.6 mg/dL (ref 0.3–1.2)
Total Bilirubin: 0.6 mg/dL (ref 0.3–1.2)
Total Protein: 6.4 g/dL (ref 6.0–8.3)

## 2011-02-22 LAB — GLUCOSE, CAPILLARY
Glucose-Capillary: 152 mg/dL — ABNORMAL HIGH (ref 70–99)
Glucose-Capillary: 153 mg/dL — ABNORMAL HIGH (ref 70–99)
Glucose-Capillary: 170 mg/dL — ABNORMAL HIGH (ref 70–99)
Glucose-Capillary: 182 mg/dL — ABNORMAL HIGH (ref 70–99)
Glucose-Capillary: 189 mg/dL — ABNORMAL HIGH (ref 70–99)
Glucose-Capillary: 193 mg/dL — ABNORMAL HIGH (ref 70–99)
Glucose-Capillary: 212 mg/dL — ABNORMAL HIGH (ref 70–99)
Glucose-Capillary: 277 mg/dL — ABNORMAL HIGH (ref 70–99)

## 2011-02-22 LAB — CBC
Hemoglobin: 12.5 g/dL (ref 12.0–15.0)
Hemoglobin: 15.2 g/dL — ABNORMAL HIGH (ref 12.0–15.0)
MCHC: 34.1 g/dL (ref 30.0–36.0)
MCHC: 34.5 g/dL (ref 30.0–36.0)
MCV: 88.3 fL (ref 78.0–100.0)
RBC: 4.14 MIL/uL (ref 3.87–5.11)
RDW: 12.3 % (ref 11.5–15.5)
WBC: 5.2 10*3/uL (ref 4.0–10.5)

## 2011-02-22 LAB — HEPATIC FUNCTION PANEL
AST: 50 U/L — ABNORMAL HIGH (ref 0–37)
Albumin: 3.7 g/dL (ref 3.5–5.2)
Total Bilirubin: 0.8 mg/dL (ref 0.3–1.2)
Total Protein: 6.8 g/dL (ref 6.0–8.3)

## 2011-02-22 LAB — BASIC METABOLIC PANEL
Calcium: 8.9 mg/dL (ref 8.4–10.5)
Creatinine, Ser: 0.74 mg/dL (ref 0.4–1.2)
GFR calc Af Amer: >60 mL/min (ref >60)
GFR calc non Af Amer: >60 mL/min (ref >60)
Glucose, Bld: 339 mg/dL — ABNORMAL HIGH (ref 70–99)
Sodium: 134 mEq/L — ABNORMAL LOW (ref 135–145)

## 2011-02-22 LAB — CLOSTRIDIUM DIFFICILE EIA: C difficile Toxins A+B, EIA: NEGATIVE

## 2011-02-22 LAB — DIFFERENTIAL
Basophils Absolute: 0 10*3/uL (ref 0.0–0.1)
Basophils Absolute: 0 10*3/uL (ref 0.0–0.1)
Basophils Relative: 0 % (ref 0–1)
Eosinophils Absolute: 0.1 10*3/uL (ref 0.0–0.7)
Eosinophils Relative: 3 % (ref 0–5)
Lymphocytes Relative: 4 % — ABNORMAL LOW (ref 12–46)
Monocytes Absolute: 0.7 10*3/uL (ref 0.1–1.0)
Neutro Abs: 9.5 10*3/uL — ABNORMAL HIGH (ref 1.7–7.7)
Neutrophils Relative %: 57 % (ref 43–77)
Neutrophils Relative %: 89 % — ABNORMAL HIGH (ref 43–77)

## 2011-02-22 LAB — URINE MICROSCOPIC-ADD ON

## 2011-02-22 LAB — URINALYSIS, ROUTINE W REFLEX MICROSCOPIC
Glucose, UA: >1000 mg/dL — AB
Hgb urine dipstick: NEGATIVE
Leukocytes, UA: NEGATIVE
Specific Gravity, Urine: 1.02 (ref 1.005–1.030)
Urobilinogen, UA: 0.2 mg/dL (ref 0.0–1.0)

## 2011-02-22 LAB — LIPASE, BLOOD
Lipase: 105 U/L — ABNORMAL HIGH (ref 11–59)
Lipase: 25 U/L (ref 11–59)

## 2011-02-22 LAB — HEMOGLOBIN A1C: Hgb A1c MFr Bld: 8.2 % — ABNORMAL HIGH (ref <5.7)

## 2011-02-22 LAB — OVA AND PARASITE EXAMINATION

## 2011-02-22 LAB — T4, FREE: Free T4: 1.03 ng/dL (ref 0.80–1.80)

## 2011-02-22 LAB — AMYLASE: Amylase: 35 U/L (ref 0–105)

## 2011-02-22 LAB — STOOL CULTURE

## 2011-03-12 LAB — GLUCOSE, CAPILLARY: Glucose-Capillary: 165 mg/dL — ABNORMAL HIGH (ref 70–99)

## 2011-04-21 NOTE — Assessment & Plan Note (Signed)
NAMEMarland Kitchen  SHIRELY, TOREN             CHART#:  [id number]   DATE:  06/22/2007                       DOB:  1971/01/21   CHIEF COMPLAINT:  Burning in stomach, diarrhea and nausea for 3 weeks.   SUBJECTIVE:  Ms. Cherry is a 40 year old female.  She has been  extensively evaluated by both Dr. Dionicia Abler and Dr. Cira Servant.  She was last  seen back in January, 2008.  She has history of functional gut disorder,  diarrhea-predominant but does alter with constipation.  She did undergo  colonoscopy by Dr. Cira Servant on December 03, 2006, given abnormal CT  findings of wall thickening in the transverse and descending colon.  Colonoscopy was normal.  Ms. Warning states for 3 weeks now she has had  constant nausea.  She was diagnosed with Strep pharyngitis.  She was  given 10 days worth of antibiotics.  She tells me she feels like her  stomach is on fire.  She has had diarrhea constantly since last  Friday.  Denies any rectal bleeding or melena.  She is having upwards of  8 bowel movements per day.  She also complains of left upper quadrant  pain that does not resolve post defecation, which is stabbing and worse  with movement.  To me, her weight is down a few pounds; by our records  it is down approximately 7 pounds.  She tells me this is unintentional.  She denies any urinary signs and symptoms.  Her blood sugars have been  abnormal, usually in the 200-400 range, although her A1c was 7, per her  report.  She was on Glucophage previously and this caused hypoglycemia  for her.   CURRENT MEDICATIONS:  See the list from June 22, 2007.   ALLERGIES:  1. CODEINE.  2. TOPAMAX.   OBJECTIVE:  VITAL SIGNS:  Weight 257.5 pounds.  Height 63 inches.  Temp  98.5.  Blood pressure 124/98.  Pulse 88.  GENERAL:  Ms. Icenhour is an obese Caucasian female who is alert,  oriented and pleasant.  Quite anxious today.  She is accompanied by her  mother.  HEENT:  Sclerae are clear, nonicteric.  Conjunctivae pink.  Oropharynx  pink and moist without any lesions.  HEART:  Regular rate and rhythm.  Normal S1 and S2.  ABDOMEN:  Positive bowel sounds x4.  No bruits auscultated.  Soft,  nondistended.  She does have mild tenderness to the epigastrium and at  the umbilicus, without rebound, tenderness or guarding.  No  hepatosplenomegaly or masses.  Exam was limited given the patient's body  habitus.  EXTREMITIES:  Without clubbing or edema bilaterally.   DIAGNOSTIC AND LABORATORY DATA:  Laboratory studies from June 06, 2007,  showed a white blood cell count of 1.4, hemoglobin 14.7, hematocrit 42.1  and platelets 289.  She had comprehensive metabolic panel which was  normal except for a glucose of 195; amylase and lipase both normal.  She  had a CT scan of the abdomen with contrast on June 06, 2007.  This was  normal and nothing to explain her abdominal pain and nausea.   ASSESSMENT:  Ms. Bahena is a 40 year old Caucasian female with history  of functional gut disorder, nonulcer dyspepsia and diarrhea-predominant  irritable bowel syndrome.  She also is diabetic and has hyperglycemia,  placing her at risk for diabetic diarrhea as well  as gastroparesis.  Her  recent antibiotic use puts her at risk for Clostridium difficile colitis  as well.   PLAN:  1. C. diff toxin.  2. Bentyl 10 mg a.c. and h.s. p.r.n. diarrhea and abdominal pain, #60      with no refills.  I have warned her that this may exacerbate her      GERD symptoms.  3. Continue Prevacid 30 mg daily.  4. Will follow pending stool for C. Diff.       Lorenza Burton, N.P.  Electronically Signed     Kassie Mends, M.D.  Electronically Signed    KJ/MEDQ  D:  06/23/2007  T:  06/23/2007  Job:  [id number]   cc:   Franchot Heidelberg, M.D.

## 2011-04-21 NOTE — Assessment & Plan Note (Signed)
NAMEMarland Kitchen  Tamara Solomon             CHART#:  [id number]   DATE:  07/13/2007                       DOB:  June 01, 1971   REFERRING PHYSICIAN:  Franchot Heidelberg, M.D.   PROBLEM LIST:  1. Burning in stomach, diarrhea and nausea secondary to irritable      bowel syndrome.  2. Gastroesophageal reflux disease.  3. Cholecystectomy.  4. Depression.  5. Hypertension.  6. Morbid obesity.   SUBJECTIVE:  Tamara Solomon is a 40 year old female who presents as a  return patient visit.  She did not like using the Bentyl four times a  day because it made her have constipation.  Her bowel movements were  hard balls.  She decreased it to three times a day and it is working a  little better.  Tomatoes and squash make her have a bad attack.  She  describes the bad attack as having to run to the bathroom for the rest  of the day after she eats those substances.  Her appetite is normal.  She started water aerobics twice a week for 8 weeks.  On a normal day  she has three bowel movements and on a bad day she has less than 10 per  day.  She is also using buttermilk for her symptoms.   MEDICATIONS:  1. Prevacid 30 mg daily.  2. Lisinopril/hydrochlorothiazide.  3. Wellbutrin.  4. Lexapro.  5. Birth control pill.  6. Bentyl 10 mg three times a day.  7. Benefiber once daily.   OBJECTIVE:  Weight 263 pounds (up approximately 5-6 pounds since July  2008).  Height 5 feet 3 inches, temperature 98.8, blood pressure 122/82,  pulse 65.GENERAL:  She is in no apparent distress, alert and oriented  x4.HEENT:  Atraumatic, normocephalic.  Pupils equal and reactive to  light.  Mouth:  No oral lesions.  Posterior pharynx without erythema or  exudate.NECK:  Has full range of motion and no lymphadenopathy.LUNGS:  Clear to auscultation bilaterally.CARDIOVASCULAR:  Regular rhythm, no  murmur, normal S1 and S2. ABDOMEN:  Bowel sounds are present, soft, mild  tenderness in all four quadrants without rebound or guarding.   ASSESSMENT:  Tamara Solomon is a 40 year old female with irritable bowel  syndrome.  Her symptoms are fairly well controlled on Bentyl and fiber.  She has gastroesophageal reflux disease which is well controlled.   Thank you for allowing me to see Tamara Solomon in consultation.  My  recommendations follow.   RECOMMENDATIONS:  1. She should continue the omeprazole daily.  2. I have asked her to add [employer] IBS on a daily basis.  3. She should continue the Bentyl and the Benefiber.  4. She should avoid foods that cause loose stool.  5. She has a return patient visit to see me in 3 months and she is      given her discharge instructions in writing.       Tamara Solomon, M.D.  Electronically Signed     Tamara Solomon  D:  07/14/2007  T:  07/15/2007  Job:  [id number]   cc:   Franchot Heidelberg, M.D.

## 2011-04-24 NOTE — Op Note (Signed)
NAMEDORIEN, BESSENT NO.:  [id number]   MEDICAL RECORD NO.:  [id number]          PATIENT TYPE:  OIB   LOCATION:  NA                           FACILITY:  [facility]   PHYSICIAN:  Payton Doughty, M.D.      DATE OF BIRTH:  12-19-1970   DATE OF PROCEDURE:  04/10/2005  DATE OF DISCHARGE:                                 OPERATIVE REPORT   PREOPERATIVE DIAGNOSIS:  Right carpal tunnel syndrome.   POSTOPERATIVE DIAGNOSIS:  Right carpal tunnel syndrome.   OPERATIVE PROCEDURE:  Right carpal tunnel release.   SURGEON:  Payton Doughty, M.D.   ANESTHESIA:  1% local lidocaine, monitoring and pharmacologic stand-by.   COMPLICATIONS:  None.   BODY OF TEXT:  This is a 40 year old girl with right carpal tunnel syndrome.  Taken to the operating room.  Following shave, prep and drape in the usual  fashion, the skin was infiltrated with 1% lidocaine overlying distal to the  most distal wrist crease between the third and fourth ray.  The skin was  incised approximately a centimeter in length and subcutaneous fat was  dissected and the transverse carpal ligament identified, divided with the  sharp blade.  The thenar branch was identified and preserved.  The  transverse carpal ligament was divided down onto the wrist and down on the  palm, thus freeing up the median nerve.  The wound was irrigated and  hemostasis assured.  Thumb function was normal.  The incision was closed  with a single-layer 3-0 nylon in interrupted vertical mattress.  A Betadine  and Telfa dressing was applied using a standard hand wrap, and the patient  returned to the recovery room in good condition.      MWR/MEDQ  D:  04/10/2005  T:  04/10/2005  Job:  [id number]

## 2011-04-24 NOTE — Op Note (Signed)
Tamara Solomon, Tamara Solomon            ACCOUNT NO.:  [id number]   MEDICAL RECORD NO.:  [id number]          PATIENT TYPE:  AMB   LOCATION:  DAY                           FACILITY:  [facility]   PHYSICIAN:  Kassie Mends, M.D.      DATE OF BIRTH:  Nov 29, 1971   DATE OF PROCEDURE:  12/03/2006  DATE OF DISCHARGE:                               OPERATIVE REPORT   PROCEDURE:  Colonoscopy.   INDICATION FOR EXAM:  Ms. Danser is a 40 year old female who had a CT  scan performed in November2007 which showed a questionable wall  thickening in the transverse and descending colon.  Colonoscopy is being  performed to evaluate the transverse and descending colon.   FINDINGS:  1. Normal colon without evidence of polyps, masses, inflammatory      changes, diverticula or AVMs.  2. Normal retroflex view of the rectum.   RECOMMENDATIONS:  1. Resume previous diet.  2. Follow up with Dr. Franchot Heidelberg.  3. Perform age appropriate colon cancer screening.   MEDICATIONS:  1. Demerol 100 mg IV.  2. Phenergan 25 mg IV.  3. Versed 6 mg IV.   PROCEDURE TECHNIQUE:  Physical exam was performed and informed consent  was obtained from the patient after explaining the benefits, risks and  alternatives to the procedure.  The patient was connected to the monitor  and placed in the left lateral position.  Continuous oxygen was provided  by nasal cannula and IV medicine administered through an indwelling  cannula.  After administration of sedation and rectal exam, the  patient's rectum was intubated and the scope was advanced under direct  visualization to the cecum.  The scope was subsequently removed slowly  by carefully examining the color, texture, anatomy and integrity of the  mucosa on the way out.  The patient was recovered in the endoscopy suite  and discharged home in satisfactory condition.     Kassie Mends, M.D.  Electronically Signed    SM/MEDQ  D:  12/03/2006  T:  12/03/2006  Job:  [id number]   cc:    Dr. [place]

## 2011-04-24 NOTE — Op Note (Signed)
NAMEDENVER, HARDER                      ACCOUNT NO.:  [id number]   MEDICAL RECORD NO.:  [id number]                   PATIENT TYPE:  OIB   LOCATION:  5729                                 FACILITY:  [facility]   PHYSICIAN:  Vikki Ports, M.D.         DATE OF BIRTH:  May 21, 1971   DATE OF PROCEDURE:  01/09/2004  DATE OF DISCHARGE:                                 OPERATIVE REPORT   PREOPERATIVE DIAGNOSIS:  Incisional hernia.   POSTOPERATIVE DIAGNOSIS:  Incisional hernia.   OPERATION PERFORMED:  Laparoscopic ventral hernia repair with mesh.   SURGEON:  Vikki Ports, M.D.   ANESTHESIA:  General.   DESCRIPTION OF PROCEDURE:  The patient was taken to the operating room and  placed in supine position.  After adequate  anesthesia was induced, using  endotracheal tube, the abdomen was prepped and draped in the normal sterile  fashion.  Using an 11 mm incision in the right subcostal incision, Optiview  technique, the peritoneum was entered and pneumoperitoneum was obtained.  Under direct visualization an 11 mm port was placed in the left lower  quadrant and a 5 mm port was placed in the left abdomen.  Omentum was  reduced out to the hernia sac using Harmonic scalpel.  The borders of the  hernia sac were identified and measured and measured 10 cm x 11 cm.  A piece  of 15 x 20 cm Parietex dual facing mesh was then brought onto the field. 2-0  Novofil sutures were placed in all quadrants of the mesh and the mesh was  introduced into the abdomen. The sutures then brought up and tied over the  anterior fascia.  The mesh lay nicely and smoothly and completely covered  the fascial defect.  The periphery was then reinforced using packs.  Adequate hemostasis was ensured and pneumoperitoneum was released.  Incisions were injected using Marcaine and closed with subcuticular 4-0  Monocryl.  Steri-Strips and sterile dressings were applied.  The patient  tolerated the procedure well and  went to PACU in good condition.                                               Vikki Ports, M.D.    KRH/MEDQ  D:  01/09/2004  T:  01/09/2004  Job:  [id number]

## 2011-04-24 NOTE — Procedures (Signed)
   Tamara Solomon, Tamara Solomon                        ACCOUNT NO.:  [id number]   MEDICAL RECORD NO.:  [id number]                   PATIENT TYPE:   LOCATION:                                       FACILITY:   PHYSICIAN:  Edward L. Juanetta Gosling, M.D.             DATE OF BIRTH:   DATE OF PROCEDURE:  11/24/2002  DATE OF DISCHARGE:                                    STRESS TEST   PROCEDURE:  Exercise stress test.   INDICATIONS FOR PROCEDURE:  The patient is undergoing graded exercise  testing because of chest discomfort.  There is a positive family history as  well.  There are no contraindications to graded exercise testing.   DESCRIPTION OF PROCEDURE:  This patient exercised for 6 minutes and 37  seconds on the Bruce Protocol reaching, and sustaining, 7.0 METS.  Her  maximum recorded heart rate was 154 which is 84% of her age-predicted  maximum heart rate.  Her blood pressure response to exercise was normal.   Cardiolite was injected per protocol and she exercised for another minute.  There were no electrocardiographic changes suggestive of inducible ischemia.  She did complain of chest pressure during exercise but had no associated EKG  changes.   IMPRESSION:  1. Fair exercise tolerance.  2. Normal blood pressure response to exercise.  3. No evidence of inducible ischemia.  4. Chest pressure with exercise, but no electrocardiographic changes.  5. Cardiolite images are pending.                                               Edward L. Juanetta Gosling, M.D.    ELH/MEDQ  D:  11/24/2002  T:  11/25/2002  Job:  [id number]

## 2012-02-16 HISTORY — PX: NM MYOCAR PERF WALL MOTION: HXRAD629

## 2012-02-16 HISTORY — PX: US ECHOCARDIOGRAPHY: HXRAD669

## 2012-03-07 ENCOUNTER — Ambulatory Visit (INDEPENDENT_AMBULATORY_CARE_PROVIDER_SITE_OTHER): Payer: BC Managed Care – PPO | Admitting: Internal Medicine

## 2012-03-07 ENCOUNTER — Encounter (INDEPENDENT_AMBULATORY_CARE_PROVIDER_SITE_OTHER): Payer: Self-pay | Admitting: Internal Medicine

## 2012-03-07 VITALS — BP 124/72 | HR 78 | Temp 98.6°F | Resp 20 | Ht 63.0 in | Wt 248.0 lb

## 2012-03-07 DIAGNOSIS — K219 Gastro-esophageal reflux disease without esophagitis: Secondary | ICD-10-CM

## 2012-03-07 DIAGNOSIS — R0789 Other chest pain: Secondary | ICD-10-CM

## 2012-03-07 NOTE — Patient Instructions (Signed)
No changes made in medications for GERD today. Call if symptoms recur

## 2012-03-07 NOTE — Consult Note (Signed)
Note dictated. Job [phone]

## 2012-03-08 NOTE — Consult Note (Signed)
NAME:  Tamara Solomon, Tamara Solomon                 ACCOUNT NO.:  MEDICAL RECORD NO.:  [id number]  LOCATION:                                 FACILITY:  PHYSICIAN:  Lionel December, M.D.    DATE OF BIRTH:  1971/03/20  DATE OF CONSULTATION:  03/07/2012 DATE OF DISCHARGE:                                CONSULTATION   CONSULTING PHYSICIAN:  Richard A. Alanda Amass, MD  PRIMARY CARE PHYSICIAN:  Catalina Pizza, MD  REASON FOR CONSULTATION:  Recurrent chest pain, negative noninvasive cardiac workup.  HISTORY OF PRESENT ILLNESS:  Tamara Solomon is a 41 year old Caucasian female who is referred through courtesy of Dr. Susa Griffins for GI evaluation.  The patient is well known to me.  She was last seen over 6 years ago for GERD and IBS.  She was evaluated by Dr. Alanda Amass, for recurrent retrosternal pain.  She had been on omeprazole and she was switched to pantoprazole.  In the meantime, she also had echocardiography which was normal except mild concentric LV hypertrophy with normal LV function and she had Lexiscan scan Myoview last month, which was low risk with no ischemia and no wall motion abnormalities. Dr. Alanda Amass, felt that her chest pain was most likely of GI origin and wondered if she needed an esophagogastroduodenoscopy.  Patient states that she has noted resolution of her pain since last week.  She states she was having this pain intermittently and would recur any time day or night and not necessarily triggered with meals or any activity and it would last for few minutes associated with nausea, but no shortness of breath.  She says her heartburn has been well controlled with therapy.  She denies dysphagia, chronic cough, or hoarseness.  She was recently treated for strep throat and complains of some postnasal drip due to allergies.  She has not experienced any side effects with pantoprazole.  She has a good appetite.  She denies recent weight loss.  Her bowels move every day.  She denies melena,  rectal bleeding, or flatulence.  The patient is complaining of low back pain as well as cramps in her lower extremities.  She states she has not been able to walk or exercise, and she is hoping to get back to her routine so she could lose some weight.  CURRENT MEDICATIONS: 1. Choline fenofibrate 135 mg p.o. daily. 2. Cyclobenzaprine 10 mg p.o. daily p.r.n. 3. Duloxetine 60 mg p.o. daily. 4. NovoLog via sliding scale. 5. Lantus 75 units subcu b.i.d. 6. Portia 28 p.o. daily. 7. Lisinopril/HCTZ 20/12.5 mg p.o. daily. 8. MVI daily. 9. Pantoprazole 40 mg p.o. b.i.d. 10.Probiotic 1 p.o. daily. 11.Rosuvastatin 10 mg p.o. daily. 12.Saxagliptin 60 mg p.o. daily.  PAST MEDICAL HISTORY:  She has been hypertensive for about 10 years. She has symptoms of GERD for over 12 years.  She had erosive esophagitis on EGD of October 2002.  More recently, she had esophagogastroduodenoscopy in October, 2010, by Dr. Darrick Penna, and was normal.  She had normal flexible sigmoidoscopy also in October 2010.  She has been diabetic for about 5 years.  She had colonoscopy in December 2007, for abnormal CT, but the study was normal.  She had  lap chole in 1997. She has had bilateral decompression for carpal tunnel twice on the right side and once on the left.  She was diagnosed with small bowel bacterial overgrowth in September 2010, and was treated with antibiotics and no relapse.  Obesity.  Her peak weight was 274 pounds.  History of depression. Hyperlipidemia.  ALLERGIES:  To CODEINE, resulting in rash and vomiting, METFORMIN resulting in nausea and vomiting, and TOPIRAMATE causing angioedema.  FAMILY HISTORY:  Mother is 41 years old and fairly good health.  Father died of metastatic lung carcinoma within 4 weeks of diagnosis at age 41. The patient does not have any siblings.  SOCIAL HISTORY:  She is married.  She has 1 son in good health.  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  She has never smoked cigarettes and does not drink alcohol.  OBJECTIVE:  VITAL SIGNS:  Weight 248 pounds, she is 63 inches tall, pulse 78 per minute, blood pressure 124/72, respirations 20. HEENT:  Conjunctivae is pink.  Sclera is nonicteric.  Oropharyngeal mucosa is normal.  No neck masses or thyromegaly noted. CARDIAC:  With regular rhythm.  Normal S1 and S2.  No murmur or gallop noted. LUNGS:  Clear to auscultation. ABDOMEN:  Obese.  Bowel sounds are normal.  On palpation, soft abdomen without tenderness, organomegaly, or masses. EXTREMITIES:  No peripheral edema or clubbing noted.  ASSESSMENT:  Kelsei is 41 year old Caucasian female with multiple medical problems including chronic gastroesophageal reflux disease, who was recently evaluated for retrosternal chest pain and noninvasive cardiac evaluation was negative.  Dr. Alanda Amass, therefore, felt that her chest pain was of GI origin.  She was switched from omeprazole to pantoprazole, it did not work initially, but now she has been symptom- free for over a week.  She does not have any alarm symptoms.  She has had 2 EGDs, the first one was in October 2002, revealing erosive esophagitis with one more recently in October 2010, was normal.  As long as she remains pain free, we will monitor her and not pursue with any studies.  Morbid obesity.  The patient needs to change her lifestyle and try to get indolent situation.  Unfortunately, her weight gain is leading to more insulin and more insulin is leading to weight gain.  RECOMMENDATIONS: 1. The patient was reassured. 2. She will continue pantoprazole at 40 mg p.o. b.i.d.  If her chest     pain recurs, she will give [place] a call, otherwise, return for office     visit in 1 year.  We appreciate the opportunity to participate in the care of this nice lady.          ______________________________ Lionel December,  M.D.     NR/MEDQ  D:  03/07/2012  T:  03/07/2012  Job:  [id number]  cc:   Gerlene Burdock A. Alanda Amass, M.D. Fax: [phone]  Catalina Pizza, M.D. Fax: [phone]

## 2012-03-09 NOTE — Progress Notes (Signed)
CONSULTING PHYSICIAN: Richard A. Alanda Amass, MD  PRIMARY CARE PHYSICIAN: Catalina Pizza, MD  REASON FOR CONSULTATION: Recurrent chest pain, negative noninvasive  cardiac workup.  HISTORY OF PRESENT ILLNESS: Tamara Solomon is a 41 year old Caucasian female  who is referred through courtesy of Dr. Susa Griffins for GI  evaluation. The patient is well known to me. She was last seen over 6  years ago for GERD and IBS. She was evaluated by Dr. Alanda Amass, for  recurrent retrosternal pain. She had been on omeprazole and she was  switched to pantoprazole. In the meantime, she also had  echocardiography which was normal except mild concentric LV hypertrophy  with normal LV function and she had Lexiscan scan Myoview last month,  which was low risk with no ischemia and no wall motion abnormalities.  Dr. Alanda Amass, felt that her chest pain was most likely of GI origin and  wondered if she needed an esophagogastroduodenoscopy.  Patient states that she has noted resolution of her pain since last  week. She states she was having this pain intermittently and would  recur any time day or night and not necessarily triggered with meals or  any activity and it would last for few minutes associated with nausea,  but no shortness of breath. She says her heartburn has been well  controlled with therapy. She denies dysphagia, chronic cough, or  hoarseness. She was recently treated for strep throat and complains of  some postnasal drip due to allergies. She has not experienced any side  effects with pantoprazole. She has a good appetite. She denies recent  weight loss. Her bowels move every day. She denies melena, rectal  bleeding, or flatulence.  The patient is complaining of low back pain as well as cramps in her  lower extremities. She states she has not been able to walk or  exercise, and she is hoping to get back to her routine so she could lose  some weight.  CURRENT MEDICATIONS:  1. Choline fenofibrate 135 mg  p.o. daily.  2. Cyclobenzaprine 10 mg p.o. daily p.r.n.  3. Duloxetine 60 mg p.o. daily.  4. NovoLog via sliding scale.  5. Lantus 75 units subcu b.i.d.  6. Portia 28 p.o. daily.  7. Lisinopril/HCTZ 20/12.5 mg p.o. daily.  8. MVI daily.  9. Pantoprazole 40 mg p.o. b.i.d.  10.Probiotic 1 p.o. daily.  11.Rosuvastatin 10 mg p.o. daily.  12.Saxagliptin 60 mg p.o. daily.  PAST MEDICAL HISTORY: She has been hypertensive for about 10 years.  She has symptoms of GERD for over 12 years. She had erosive esophagitis  on EGD of October 2002. More recently, she had  esophagogastroduodenoscopy in October, 2010, by Dr. Darrick Penna, and was  normal.  She had normal flexible sigmoidoscopy also in October 2010. She has  been diabetic for about 5 years. She had colonoscopy in December 2007,  for abnormal CT, but the study was normal. She had lap chole in 1997.  She has had bilateral decompression for carpal tunnel twice on the right  side and once on the left. She was diagnosed with small bowel bacterial  overgrowth in September 2010, and was treated with antibiotics and no  relapse. Obesity. Her peak weight was 274 pounds. History of  depression.  Hyperlipidemia.  ALLERGIES: To CODEINE, resulting in rash and vomiting, METFORMIN  resulting in nausea and vomiting, and TOPIRAMATE causing angioedema.  FAMILY HISTORY: Mother is 25 years old and fairly good health. Father  died of metastatic lung carcinoma within 4 weeks of diagnosis at  age 2.  The patient does not have any siblings.  SOCIAL HISTORY: She is married. She has 1 son in good health. 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 She has never smoked cigarettes  and does not drink alcohol.  OBJECTIVE: VITAL SIGNS: Weight 248 pounds, she is 63 inches tall,  pulse 78 per minute, blood pressure 124/72, respirations 20.  HEENT: Conjunctivae is pink. Sclera is nonicteric.  Oropharyngeal  mucosa is normal. No neck masses or thyromegaly noted.  CARDIAC: With regular rhythm. Normal S1 and S2. No murmur or gallop  noted.  LUNGS: Clear to auscultation.  ABDOMEN: Obese. Bowel sounds are normal. On palpation, soft abdomen  without tenderness, organomegaly, or masses.  EXTREMITIES: No peripheral edema or clubbing noted.  ASSESSMENT: [name] is 41 year old Caucasian female with multiple  medical problems including chronic gastroesophageal reflux disease, who  was recently evaluated for retrosternal chest pain and noninvasive  cardiac evaluation was negative. Dr. Alanda Amass, therefore, felt that  her chest pain was of GI origin. She was switched from omeprazole to  pantoprazole, it did not work initially, but now she has been symptom-  free for over a week. She does not have any alarm symptoms. She has  had 2 EGDs, the first one was in October 2002, revealing erosive  esophagitis with one more recently in October 2010, was normal. As long  as she remains pain free, we will monitor her and not pursue with any  studies.  Morbid obesity. The patient needs to change her lifestyle and try to  get indolent situation. Unfortunately, her weight gain is leading to  more insulin and more insulin is leading to weight gain.  RECOMMENDATIONS:  1. The patient was reassured.  2. She will continue pantoprazole at 40 mg p.o. b.i.d. If her chest  pain recurs, she will give [place] a call, otherwise, return for office  visit in 1 year.  We appreciate the opportunity to participate in the care of this nice  lady.

## 2012-03-16 ENCOUNTER — Other Ambulatory Visit (HOSPITAL_COMMUNITY): Payer: Self-pay | Admitting: Internal Medicine

## 2012-03-16 DIAGNOSIS — R748 Abnormal levels of other serum enzymes: Secondary | ICD-10-CM

## 2012-03-18 ENCOUNTER — Ambulatory Visit (HOSPITAL_COMMUNITY)
Admission: RE | Admit: 2012-03-18 | Discharge: 2012-03-18 | Disposition: A | Discharge status: Home / Self Care | Payer: BC Managed Care – PPO | Source: Ambulatory Visit | Attending: Internal Medicine | Admitting: Internal Medicine

## 2012-03-18 ENCOUNTER — Other Ambulatory Visit (HOSPITAL_COMMUNITY): Payer: Self-pay | Admitting: Internal Medicine

## 2012-03-18 DIAGNOSIS — R748 Abnormal levels of other serum enzymes: Secondary | ICD-10-CM

## 2012-03-18 DIAGNOSIS — K7689 Other specified diseases of liver: Secondary | ICD-10-CM | POA: Insufficient documentation

## 2012-09-09 ENCOUNTER — Other Ambulatory Visit (HOSPITAL_COMMUNITY): Payer: Self-pay | Admitting: Internal Medicine

## 2012-09-09 ENCOUNTER — Ambulatory Visit (HOSPITAL_COMMUNITY)
Admission: RE | Admit: 2012-09-09 | Discharge: 2012-09-09 | Disposition: A | Discharge status: Home / Self Care | Payer: BC Managed Care – PPO | Source: Ambulatory Visit | Attending: Internal Medicine | Admitting: Internal Medicine

## 2012-09-09 DIAGNOSIS — R109 Unspecified abdominal pain: Secondary | ICD-10-CM | POA: Insufficient documentation

## 2012-09-09 DIAGNOSIS — R197 Diarrhea, unspecified: Secondary | ICD-10-CM | POA: Insufficient documentation

## 2012-09-09 DIAGNOSIS — R0602 Shortness of breath: Secondary | ICD-10-CM | POA: Insufficient documentation

## 2012-10-06 ENCOUNTER — Telehealth (INDEPENDENT_AMBULATORY_CARE_PROVIDER_SITE_OTHER): Payer: Self-pay | Admitting: *Deleted

## 2012-10-06 ENCOUNTER — Encounter (INDEPENDENT_AMBULATORY_CARE_PROVIDER_SITE_OTHER): Payer: Self-pay | Admitting: Internal Medicine

## 2012-10-06 ENCOUNTER — Ambulatory Visit (INDEPENDENT_AMBULATORY_CARE_PROVIDER_SITE_OTHER): Payer: BC Managed Care – PPO | Admitting: Internal Medicine

## 2012-10-06 VITALS — BP 116/70 | HR 84 | Temp 98.7°F | Ht 63.0 in | Wt 247.1 lb

## 2012-10-06 DIAGNOSIS — Z8719 Personal history of other diseases of the digestive system: Secondary | ICD-10-CM

## 2012-10-06 DIAGNOSIS — R197 Diarrhea, unspecified: Secondary | ICD-10-CM

## 2012-10-06 DIAGNOSIS — Z0189 Encounter for other specified special examinations: Secondary | ICD-10-CM

## 2012-10-06 NOTE — Progress Notes (Addendum)
Subjective:     Patient ID: Tamara Solomon, female   DOB: 1971-01-25, 41 y.o.   MRN: [id number]  HPI Here today with c/o diarrhea which she describes as violent. She tells me food does not go completely down. She says her abdomen will swell. She says she has missed 5 days in October for this.  She has frequent nausea. She is still having diarrhea.  Diarrhea started 09/02/2012. She has had some formed stools. She will have 7-9 stools on the day she has diarrhea. She is having diarrhea at least every other day. She is not on any new medications.  Stools studies in the past have been negative. No recent antibiotics. In 2010 she underwent a sigmoidoscopy with cold forceps biopsy:  For complaints of persistent diarrhea x 5 months. Findings: Formed stool in the colon up to 50 cm from the anal verge. Occasional diverticula. Otherwise no masses, inflammatory changes , polyps or AVMs. Small internal hemorrhoids. Otherwise, normal retroflexed view of the rectum.  09/09/2012 Acute abdomen:: for hx of small bowel obstruction: Moderate cardiac silhouette enlargement. Slightly larger than  previously. No acute cardiopulmonary or pleural abnormality is  evident.  No pneumoperitoneum. Postoperative changes.  Moderate fecal distention of portions of the colon. No dilated  loops of small intestine or small intestinal air fluid levels are  seen to diagnose small-bowel obstruction.    Colonoscopy in 2007 Dr. Darrick Penna; PROCEDURE: Colonoscopy.  INDICATION FOR EXAM: Ms. Perl is a 41 year old female who had a CT  scan performed in November2007 which showed a questionable wall  thickening in the transverse and descending colon. Colonoscopy is being  performed to evaluate the transverse and descending colon.  FINDINGS:  1. Normal colon without evidence of polyps, masses, inflammatory  changes, diverticula or AVMs.  2. Normal retroflex view of the rectum.  Review of Systems see hpi Current Outpatient  Prescriptions  Medication Sig Dispense Refill  . Choline Fenofibrate (TRILIPIX) 135 MG capsule Take 135 mg by mouth daily.      . cyclobenzaprine (FLEXERIL) 10 MG tablet 10 mg as needed.       . DULoxetine (CYMBALTA) 60 MG capsule Take 60 mg by mouth daily.      . insulin aspart (NOVOLOG FLEXPEN) 100 UNIT/ML injection Inject into the skin. Patient uses this as her sliding scale      . Insulin Glargine (LANTUS [place]) Inject 100 Units into the skin 2 (two) times daily.       . Levonorgestrel-Ethinyl Estrad (PORTIA-28 PO) Take by mouth daily.      Marland Kitchen lisinopril-hydrochlorothiazide (PRINZIDE,ZESTORETIC) 20-12.5 MG per tablet Take 1 tablet by mouth daily.      . MULTIPLE VITAMINS PO Take by mouth daily.      . Probiotic Product (PROBIOTIC FORMULA PO) Take by mouth daily.      . rosuvastatin (CRESTOR) 10 MG tablet Take 10 mg by mouth daily.      . Saxagliptin HCl (ONGLYZA PO) Take 60 mg by mouth daily.       Past Medical History  Diagnosis Date  . Irritable bowel syndrome   . GERD (gastroesophageal reflux disease)   . Hypertension   . Diabetes mellitus    Past Surgical History  Procedure Date  . Liver surgery 2000    Liver resection  . Cholecystectomy 1997  . Carpal tunnel release     Patient has had three times - 2 times right hand, one time to the left  . Tonsillectomy 1978  . Hernia  repair 2004  . Upper gastrointestinal endoscopy   . Colonoscopy   . Dilation and curettage of uterus 2009  . Cervical cone biopsy 2009   Ms. Lemen had no medications administered during this visit. Family Status  Relation Status Death Age  . Mother Alive   . Father Deceased 21  . Son Alive    History   Social History  . Marital Status: Married    Spouse Name: N/A    Number of Children: N/A  . Years of Education: N/A   Occupational History  . Not on file.   Social History Main Topics  . Smoking status: Never Smoker   . Smokeless tobacco: Never Used  . Alcohol Use: No  . Drug Use: No  .  Sexually Active: No   Other Topics Concern  . Not on file   Social History Narrative  . No narrative on file   Allergies  Allergen Reactions  . Codeine     REACTION: Rash and vomitting  . Metformin     REACTION: Nausea and vomitting  . Topiramate     REACTION: Angioedema        Objective:   Physical Exam Filed Vitals:   10/06/12 1434  BP: 116/70  Pulse: 84  Temp: 98.7 F (37.1 C)  Height: 5\' 3"  (1.6 m)  Weight: 247 lb 1.6 oz (112.084 kg)   Alert and oriented. Skin warm and dry. Oral mucosa is moist.   . Sclera anicteric, conjunctivae is pink. Thyroid not enlarged. No cervical lymphadenopathy. Lungs clear. Heart regular rate and rhythm.  Abdomen is soft, and obese. Bowel sounds are positive. No hepatomegaly. No abdominal masses felt. Slight tenderness left upper quadrant.  No edema to lower extremities.       Assessment:     Diarrhea with hx of IBS.  US abdomen showed moderate fecal distention of portion of the colon. No dilated. Hx of  Small bowel obstruction in the past.     Plan:    CT abdomen and pelvis with CM.  Fiber tabs 4 tabs daily. Further recommendations once we have CT back.

## 2012-10-06 NOTE — Patient Instructions (Addendum)
CT abdomen/pelvis with CM. Fiber 4 gms daily.

## 2012-10-06 NOTE — Telephone Encounter (Signed)
Patient to have CT

## 2012-10-07 ENCOUNTER — Ambulatory Visit (HOSPITAL_COMMUNITY)
Admission: RE | Admit: 2012-10-07 | Discharge: 2012-10-07 | Disposition: A | Discharge status: Home / Self Care | Payer: BC Managed Care – PPO | Source: Ambulatory Visit | Attending: Internal Medicine | Admitting: Internal Medicine

## 2012-10-07 DIAGNOSIS — R1031 Right lower quadrant pain: Secondary | ICD-10-CM | POA: Insufficient documentation

## 2012-10-07 DIAGNOSIS — R935 Abnormal findings on diagnostic imaging of other abdominal regions, including retroperitoneum: Secondary | ICD-10-CM | POA: Insufficient documentation

## 2012-10-07 DIAGNOSIS — R197 Diarrhea, unspecified: Secondary | ICD-10-CM

## 2012-10-07 DIAGNOSIS — Z8719 Personal history of other diseases of the digestive system: Secondary | ICD-10-CM

## 2012-10-07 DIAGNOSIS — R1032 Left lower quadrant pain: Secondary | ICD-10-CM | POA: Insufficient documentation

## 2012-10-07 MED ORDER — IOHEXOL 300 MG/ML  SOLN
100.0000 mL | Freq: Once | INTRAMUSCULAR | Status: AC | PRN
  Administered 2012-10-07: 100 mL via INTRAVENOUS

## 2012-10-11 ENCOUNTER — Telehealth (INDEPENDENT_AMBULATORY_CARE_PROVIDER_SITE_OTHER): Payer: Self-pay | Admitting: Internal Medicine

## 2012-10-11 DIAGNOSIS — K769 Liver disease, unspecified: Secondary | ICD-10-CM

## 2012-10-11 NOTE — Telephone Encounter (Signed)
I discussed with DR. Rehman

## 2012-10-12 ENCOUNTER — Encounter (INDEPENDENT_AMBULATORY_CARE_PROVIDER_SITE_OTHER): Payer: Self-pay | Admitting: Internal Medicine

## 2012-10-12 DIAGNOSIS — K769 Liver disease, unspecified: Secondary | ICD-10-CM

## 2012-10-12 NOTE — Progress Notes (Signed)
This encounter was created in error - please disregard.

## 2012-10-14 ENCOUNTER — Other Ambulatory Visit (HOSPITAL_COMMUNITY): Payer: BC Managed Care – PPO

## 2012-10-16 ENCOUNTER — Ambulatory Visit
Admission: RE | Admit: 2012-10-16 | Discharge: 2012-10-16 | Disposition: A | Discharge status: Home / Self Care | Payer: BC Managed Care – PPO | Source: Ambulatory Visit | Attending: Internal Medicine | Admitting: Internal Medicine

## 2012-10-16 DIAGNOSIS — K769 Liver disease, unspecified: Secondary | ICD-10-CM

## 2012-10-16 MED ORDER — GADOXETATE DISODIUM 0.25 MMOL/ML IV SOLN
9.0000 mL | Freq: Once | INTRAVENOUS | Status: AC | PRN
  Administered 2012-10-16: 9 mL via INTRAVENOUS

## 2012-10-17 ENCOUNTER — Ambulatory Visit (INDEPENDENT_AMBULATORY_CARE_PROVIDER_SITE_OTHER): Payer: BC Managed Care – PPO | Admitting: Internal Medicine

## 2012-10-17 ENCOUNTER — Encounter (INDEPENDENT_AMBULATORY_CARE_PROVIDER_SITE_OTHER): Payer: Self-pay | Admitting: Internal Medicine

## 2012-10-17 VITALS — BP 110/74 | HR 80 | Temp 97.4°F | Resp 18 | Ht 63.0 in | Wt 251.0 lb

## 2012-10-17 DIAGNOSIS — R932 Abnormal findings on diagnostic imaging of liver and biliary tract: Secondary | ICD-10-CM

## 2012-10-17 DIAGNOSIS — Z8719 Personal history of other diseases of the digestive system: Secondary | ICD-10-CM

## 2012-10-17 DIAGNOSIS — Z87898 Personal history of other specified conditions: Secondary | ICD-10-CM

## 2012-10-17 NOTE — Progress Notes (Addendum)
Presenting complaint;  Followup for diarrhea and liver lesions.  Subjective:  Patient is 41 year old Caucasian female who was seen in our office by Ms. Raynelle Bring NP on 10/06/2012 for recent onset of diarrhea. She also has history of small bowel obstruction and therefore underwent abdominopelvic CT which she had on 10/12/2012 revealing small hypodense lesion within the right lobe of the liver measuring 14 mm. She therefore had multiphasic MRI of liver and here to discuss the results. She is extremely worried and accompanied by her mother today. She is wondering if she has the same problem that she had back in 2000 when she had surgery with resection of liver lesion. She has no pre-collection as to the nature of the lesion. Her diarrhea has resolved. For the last 4 days she's had normal stools. She states she had diarrhea for several years and was worked up at [facility] and final diagnosis was IBS. Then in June 2011 she was hospitalized with gastric stasis and possible small bowel responding to medical therapy and she had no problems with diarrhea until this episode last month. She has noted some pain and right upper quadrant but denies melena or rectal bleeding. She hasn't had any chest pain in the last few months. I saw her back in April this year for noncardiac chest pain felt to be secondary to GERD.  Current Medications: Current Outpatient Prescriptions  Medication Sig Dispense Refill  . Choline Fenofibrate (TRILIPIX) 135 MG capsule Take 135 mg by mouth daily.      . cyclobenzaprine (FLEXERIL) 10 MG tablet 10 mg as needed.       . DULoxetine (CYMBALTA) 60 MG capsule Take 60 mg by mouth daily.      . insulin aspart (NOVOLOG FLEXPEN) 100 UNIT/ML injection Inject into the skin. Patient uses this as her sliding scale      . Insulin Glargine (LANTUS [place]) Inject 100 Units into the skin 2 (two) times daily.       . Levonorgestrel-Ethinyl Estrad (PORTIA-28 PO) Take by mouth daily.      Marland Kitchen  lisinopril-hydrochlorothiazide (PRINZIDE,ZESTORETIC) 20-12.5 MG per tablet Take 1 tablet by mouth daily.      . MULTIPLE VITAMINS PO Take by mouth daily.      . rosuvastatin (CRESTOR) 10 MG tablet Take 10 mg by mouth daily.      . Saxagliptin HCl (ONGLYZA PO) Take 60 mg by mouth daily.         Objective: Blood pressure 110/74, pulse 80, temperature 97.4 F (36.3 C), temperature source Oral, resp. rate 18, height 5\' 3"  (1.6 m), weight 251 lb (113.853 kg), last menstrual period 10/07/2012. Patient is alert and in no acute distress. Conjunctiva is pink. Sclera is nonicteric Oropharyngeal mucosa is normal. No neck masses or thyromegaly noted. Cardiac exam with regular rhythm normal S1 and S2. No murmur or gallop noted. Lungs are clear to auscultation. Abdomen is obese. Bowel sounds are normal. Abdomen is soft with mild tenderness at RUQ but difficult to feel liver edge. No LE edema or clubbing noted.  Labs/studies Results: Recent CT and MRI films reviewed with the patient and her mother. She has hepatic steatosis. She has 3 liver lesions all within the right lobe. One located superiorly in the dome of the liver is 13 mm; the lesion between junctions of segments 6 and 7 measures 17 x 14 mm and has arterial phase hyper enhancement. There is another lesion between segments 5 and 8 measuring 10 mm located peripherally.  In addition  there are few other faint foci of arterial phase enhancement in segment 4B.   Assessment:  #1. Diarrhea has resolved possibly due to self-limiting gastroenteritis. If it recurs will screen for celiac disease. #2. Multiple small liver lesions which three are well defined and appeared to have arterial enhancement is suspected to be benign lesions either adenomas or atypical hemangiomas. Dr. Ova Freshwater felt that these lesions are also seen on prior imaging studies. If this is the case no further workup is needed but first I will review these studies with Dr. Tyron Russell and then  make further recommendations.   Plan:  Patient reassured liver lesions meet criterion for benign disease. Will review scans with Dr. Tyron Russell and also try to obtain more information as to the every lesion that she had surgically removed in 2000. I would be contacting patient later this week with further recommendations. Regarding fatty liver should advice that she must become more active and exercise regularly otherwise her fatty liver disease could become an initial in her lifetime.  Addendum placed on 10/18/2012. CT from 2008, 2011 and one from last week reviewed with Dr. Tyron Russell along with recent MRI of liver. These lesions were present on CT of 2011 and at least 2 of these were seen on study of 2008. Therefore these lesions would appear to be benign. Patient called and results reviewed. She is still quite nervous and has requested consultation with Dr. Almond Lint of [facility].

## 2012-10-17 NOTE — Patient Instructions (Signed)
Please keep stool diary as to frequency and consistency of stools for the next 3 months. Physician will contact you after review of CT and MRI with Dr. Tyron Russell

## 2012-10-21 ENCOUNTER — Telehealth (INDEPENDENT_AMBULATORY_CARE_PROVIDER_SITE_OTHER): Payer: Self-pay

## 2012-10-21 NOTE — Telephone Encounter (Signed)
Will contact the patient if any cancellations before her appointment with Dr. Donell Beers on 11/14/12.

## 2012-11-01 ENCOUNTER — Telehealth (INDEPENDENT_AMBULATORY_CARE_PROVIDER_SITE_OTHER): Payer: Self-pay | Admitting: *Deleted

## 2012-11-01 NOTE — Telephone Encounter (Signed)
Tamara Solomon called and states that she has since last Thursday had diarrhea ,and she has a history of bacterial overgrowth. She states that Dr.Rehman had mentioned that there was an antibiotic she could take and she is requesting it. Per Dr.Rehman may call in Cipro 500 mg Take 1 by mouth twice a day for 2 weeks. This was called the [facility]. Patient was called and made aware on 10/31/12.

## 2012-11-14 ENCOUNTER — Encounter (INDEPENDENT_AMBULATORY_CARE_PROVIDER_SITE_OTHER): Payer: Self-pay | Admitting: General Surgery

## 2012-11-14 ENCOUNTER — Ambulatory Visit (INDEPENDENT_AMBULATORY_CARE_PROVIDER_SITE_OTHER): Payer: BC Managed Care – PPO | Admitting: General Surgery

## 2012-11-14 VITALS — BP 134/80 | HR 88 | Temp 97.6°F | Resp 16 | Ht 63.0 in | Wt 249.2 lb

## 2012-11-14 DIAGNOSIS — K769 Liver disease, unspecified: Secondary | ICD-10-CM

## 2012-11-14 DIAGNOSIS — R160 Hepatomegaly, not elsewhere classified: Secondary | ICD-10-CM

## 2012-11-14 NOTE — Assessment & Plan Note (Signed)
Patient appears to have multiple benign lesions in the liver. It is unclear whether these have been there on all of her prior imaging or not. There are conflicting reports.  I discussed these with interventional radiology. We are in and try to get one of the lower ones biopsied percutaneously. Because of the conflicting reports, I am asking them to meet with her ahead of time in their clinic.  Depending on the reports, this will determine her followup. I advised her that there are 3 main results that we will get from the biopsy.  The first possibility is that this is benign and the only way to get repeat imaging in 1-2 years. Some lesions may not require any followup imaging.  If the result is a malignancy, I would refer her to the cancer Center at [facility].   There is also a reasonable likelihood that the biopsy would not give [place] an answer. If that's the case I would get early followup imaging in 3-6 months.  The patient was amenable to the plan.

## 2012-11-14 NOTE — Progress Notes (Signed)
Chief Complaint  Patient presents with  . New Evaluation    eval of liver lestion    HISTORY: Patient is a 41 year old female who had a benign liver mass resected in 2000. This was done by Dr. Colin Benton.  She says she did not ever get a definite diagnosis on the mass, but they were told it was benign.  Over the last few months, she has been having right upper quadrant pain and feeling sick with diarrhea. Part of her workup with CT which showed some questionable liver lesions. An MRI was done in followup which demonstrated 5 liver lesions scattered throughout both lobes. The radiologist that read it did see some subtle changes in her CAT scans when he went back over 5 years. He felt these were similar. The radiologist for any and had not seen them and they certainly are not immediately apparent when viewing the images.  She wanted a surgical opinion regarding these masses because of her concern for cancer. She does have a positive family history of liver cancer. She's not had fevers and chills.  She was diagnosed with IBS.  Past Medical History  Diagnosis Date  . Irritable bowel syndrome   . GERD (gastroesophageal reflux disease)   . Hypertension   . Diabetes mellitus   . Hyperlipidemia     Past Surgical History  Procedure Date  . Liver surgery 2000    Liver resection  . Cholecystectomy 1997  . Carpal tunnel release     Patient has had three times - 2 times right hand, one time to the left  . Tonsillectomy 1978  . Hernia repair 2004  . Upper gastrointestinal endoscopy   . Colonoscopy   . Dilation and curettage of uterus 2009  . Cervical cone biopsy 2009    Current Outpatient Prescriptions  Medication Sig Dispense Refill  . Choline Fenofibrate (TRILIPIX) 135 MG capsule Take 135 mg by mouth daily.      . DULoxetine (CYMBALTA) 60 MG capsule Take 60 mg by mouth daily.      . insulin aspart (NOVOLOG FLEXPEN) 100 UNIT/ML injection Inject into the skin. Patient uses this as her sliding scale       . Insulin Glargine (LANTUS [place]) Inject 100 Units into the skin 2 (two) times daily.       . Levonorgestrel-Ethinyl Estrad (PORTIA-28 PO) Take by mouth daily.      Marland Kitchen lisinopril-hydrochlorothiazide (PRINZIDE,ZESTORETIC) 20-12.5 MG per tablet Take 1 tablet by mouth daily.      . rosuvastatin (CRESTOR) 10 MG tablet Take 10 mg by mouth daily.      . Saxagliptin HCl (ONGLYZA PO) Take 60 mg by mouth daily.      Marland Kitchen ACCU-CHEK AVIVA PLUS test strip       . ACIPHEX 20 MG tablet       . APIDRA SOLOSTAR 100 UNIT/ML injection       . ciprofloxacin (CIPRO) 500 MG tablet       . cyclobenzaprine (FLEXERIL) 10 MG tablet 10 mg as needed.       . MULTIPLE VITAMINS PO Take by mouth daily.         Allergies  Allergen Reactions  . Codeine     REACTION: Rash and vomitting  . Metformin     REACTION: Nausea and vomitting  . Topiramate     REACTION: Angioedema     Family History  Problem Relation Age of Onset  . Healthy Mother   . Liver cancer Father   .  Healthy Son      History   Social History  . Marital Status: Married    Spouse Name: N/A    Number of Children: N/A  . Years of Education: N/A   Social History Main Topics  . Smoking status: Never Smoker   . Smokeless tobacco: Never Used  . Alcohol Use: No  . Drug Use: No  . Sexually Active: No   Other Topics Concern  . None   Social History Narrative  . None     REVIEW OF SYSTEMS - PERTINENT POSITIVES ONLY: 12 point review of systems negative other than HPI and PMH except for headaches.  EXAM: Filed Vitals:   11/14/12 0918  BP: 134/80  Pulse: 88  Temp: 97.6 F (36.4 C)  Resp: 16    Gen:  No acute distress.  Well nourished and well groomed.  Obese. Neurological: Alert and oriented to person, place, and time. Coordination normal.  Head: Normocephalic and atraumatic.  Eyes: Conjunctivae are normal. Pupils are equal, round, and reactive to light. No scleral icterus.  Neck: Normal range of motion. Neck supple. No  tracheal deviation or thyromegaly present.  Cardiovascular: Normal rate, regular rhythm, normal heart sounds and intact distal pulses.  Exam reveals no gallop and no friction rub.  No murmur heard. Respiratory: Effort normal.  No respiratory distress. No chest wall tenderness. Breath sounds normal.  No wheezes, rales or rhonchi.  GI: Soft. Bowel sounds are normal. The abdomen is soft and nontender except at upper portion of scar.  There is no rebound and no guarding.   Midline scar.   Musculoskeletal: Normal range of motion. Extremities are nontender.  Lymphadenopathy: No cervical, preauricular, postauricular or axillary adenopathy is present Skin: Skin is warm and dry. No rash noted. No diaphoresis. No erythema. No pallor. No clubbing, cyanosis, or edema.   Psychiatric: Normal mood and affect. Behavior is normal. Judgment and thought content normal.    LABORATORY RESULTS: Available labs are reviewed  Pathology being retrieved from cone.     RADIOLOGY RESULTS: See E-Chart or I-Site for most recent results.  Images and reports are reviewed.  MR liver IMPRESSION:  1. Several small arterial phase enhancing lesions are present  scattered in the liver. For the most part I can identify these on  prior exams back through the last 5 years, although the appearance  is highly subtle both on CT and relatively subtle on MRI.  Enhancement characteristics raise the possibility of hepatic  adenomas or atypical hemangiomas. Entities such as hepatocellular  carcinoma are considered unlikely given the appearance of long term  stability. Dysplastic nodules are considered unlikely outside of  the setting of cirrhosis. Correlation with the pathology of the  reportedly benign lesion removed 13 years ago from the liver is  suggested.  2. Hepatic steatosis.   Ct liver IMPRESSION:  1. No explanation for right flank pain.  2. Hypodense lesion within the right hepatic lobe corresponds to  small enhancing  lesion on more remote scans therefore favor this to  represent a benign finding. Recommend MRI of the liver with and  without contrast if the patient has any risk factors for hepatic  cellular carcinoma or liver metastasis.   ASSESSMENT AND PLAN: Liver masses Patient appears to have multiple benign lesions in the liver. It is unclear whether these have been there on all of her prior imaging or not. There are conflicting reports.  I discussed these with interventional radiology. We are in and try to  get one of the lower ones biopsied percutaneously. Because of the conflicting reports, I am asking them to meet with her ahead of time in their clinic.  Depending on the reports, this will determine her followup. I advised her that there are 3 main results that we will get from the biopsy.  The first possibility is that this is benign and the only way to get repeat imaging in 1-2 years. Some lesions may not require any followup imaging.  If the result is a malignancy, I would refer her to the cancer Center at [facility].   There is also a reasonable likelihood that the biopsy would not give [place] an answer. If that's the case I would get early followup imaging in 3-6 months.  The patient was amenable to the plan.     Pt may not be good candidate for biopsy.  Would at least have her meet radiology and decide best course of action.    Maudry Diego MD Surgical Oncology, General and Endocrine Surgery [facility]      Visit Diagnoses: 1. Liver masses     Primary Care Physician: Dwana Melena, MD

## 2012-11-14 NOTE — Patient Instructions (Signed)
See IR (interventional radiology)  Get biopsy.  Will determine follow up based on biopsy result.

## 2012-11-15 ENCOUNTER — Ambulatory Visit
Admission: RE | Admit: 2012-11-15 | Discharge: 2012-11-15 | Disposition: A | Discharge status: Home / Self Care | Payer: BC Managed Care – PPO | Source: Ambulatory Visit | Attending: General Surgery | Admitting: General Surgery

## 2012-11-15 VITALS — BP 109/70 | HR 92 | Temp 98.5°F | Resp 14 | Ht 63.0 in | Wt 249.0 lb

## 2012-11-15 DIAGNOSIS — R160 Hepatomegaly, not elsewhere classified: Secondary | ICD-10-CM

## 2012-11-28 ENCOUNTER — Other Ambulatory Visit (HOSPITAL_COMMUNITY): Payer: Self-pay | Admitting: Internal Medicine

## 2012-11-28 DIAGNOSIS — M5410 Radiculopathy, site unspecified: Secondary | ICD-10-CM

## 2012-12-05 ENCOUNTER — Ambulatory Visit
Admission: RE | Admit: 2012-12-05 | Discharge: 2012-12-05 | Disposition: A | Discharge status: Home / Self Care | Payer: BC Managed Care – PPO | Source: Ambulatory Visit | Attending: Internal Medicine | Admitting: Internal Medicine

## 2012-12-05 DIAGNOSIS — M5410 Radiculopathy, site unspecified: Secondary | ICD-10-CM

## 2012-12-05 NOTE — Progress Notes (Signed)
I was able to track the results of liver surgery. On 08/18/1999 she had resection of segment 4 by Dr. Danna Hefty. I reviewed pathology report with  Dr. 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 Patient called and message left on her answering service. She has seen Dr. Donell Beers and will undergo liver biopsy for definite diagnosis.

## 2013-01-10 ENCOUNTER — Encounter (INDEPENDENT_AMBULATORY_CARE_PROVIDER_SITE_OTHER): Payer: Self-pay

## 2013-01-21 ENCOUNTER — Other Ambulatory Visit: Payer: Self-pay

## 2013-01-27 ENCOUNTER — Encounter (INDEPENDENT_AMBULATORY_CARE_PROVIDER_SITE_OTHER): Payer: Self-pay | Admitting: *Deleted

## 2013-03-20 ENCOUNTER — Telehealth (INDEPENDENT_AMBULATORY_CARE_PROVIDER_SITE_OTHER): Payer: Self-pay | Admitting: *Deleted

## 2013-03-20 NOTE — Telephone Encounter (Signed)
LM stating she is having a problem with over growth of bacteria. Natoshia has violent diarrhea and was told to call the office when she needs an antibiotic. The return phone number is [phone].

## 2013-03-20 NOTE — Telephone Encounter (Signed)
Noted and forwarded to Dr.Rehman

## 2013-03-21 NOTE — Telephone Encounter (Signed)
Per Dr.Rehman call in Flagyl 250 mg Take 1 by mouth three times a day times 14 days. He also states that the patient will need to have a office appointment. If she request a work note she will have to get it from her PCP per Dr.Rehman. Prescription was called to the [facility] Patient made aware

## 2013-03-23 NOTE — Telephone Encounter (Signed)
Patient begun on metronidazole 250 mg by mouth twice a day for 2 weeks for possible small bowel bacterial overgrowth

## 2013-03-23 NOTE — Telephone Encounter (Signed)
LM for patient to return the call to schedule a f/u apt.   

## 2013-04-05 NOTE — Telephone Encounter (Signed)
Apt has been scheduled an apt for 04/11/13 with Dr. Karilyn Cota.

## 2013-04-11 ENCOUNTER — Ambulatory Visit (INDEPENDENT_AMBULATORY_CARE_PROVIDER_SITE_OTHER): Payer: BC Managed Care – PPO | Admitting: Internal Medicine

## 2013-04-11 ENCOUNTER — Encounter (INDEPENDENT_AMBULATORY_CARE_PROVIDER_SITE_OTHER): Payer: Self-pay | Admitting: Internal Medicine

## 2013-04-11 VITALS — BP 112/76 | HR 78 | Temp 100.4°F | Resp 18 | Ht 63.0 in | Wt 242.2 lb

## 2013-04-11 DIAGNOSIS — R197 Diarrhea, unspecified: Secondary | ICD-10-CM

## 2013-04-11 DIAGNOSIS — R932 Abnormal findings on diagnostic imaging of liver and biliary tract: Secondary | ICD-10-CM

## 2013-04-11 MED ORDER — DICYCLOMINE HCL 10 MG PO CAPS: 10.0000 mg | ORAL_CAPSULE | Freq: Three times a day (TID) | ORAL | Status: DC

## 2013-04-11 NOTE — Patient Instructions (Signed)
Stool diary 6-8 weeks and send [place] a summary. MRI of the liver to be done in November,2014.

## 2013-04-11 NOTE — Progress Notes (Signed)
Presenting complaint;  Diarrhea and bloating. History of liver lesions.  Subjective:  Patient is 42 year old Caucasian female who was initially seen in November last year for hepatic lesions. She has history of focal motor or hyperplasia. She had segment 4 resected by Dr. Danna Hefty in September 2000. In October last year she had abdominopelvic CT because of bilateral flank pain. She had hypodense lesion within the right lobe of the liver which was further evaluated with MRI. MRI revealed multiple small arterial phase enhancing lesions scattered in the liver. She also hepatic steatosis. She was subsequently seen by Dr. Woodfin Ganja and by Dr. Oley Balm and patient was advised followup rather than surgery. [place] she was treated with metronidazole with the thought that she may have bacterial growth and she felt better. She called her office 2 days ago and was begun on Cipro. Her diarrhea has improved a great deal. She continues to complain of excessive flatulence and abdominal rumbling. She denies abdominal pain or cramps. On a bad states she has to soft stools. She denies passing blood cure or any stools. She also denies melena or rectal bleeding she does have nocturnal bowel movements. She wonders if she has pancreatic exocrine insufficiency. She's been diabetic since 2008. She has lost 9 pounds since her last visit 6 months ago.   Current Medications: Current Outpatient Prescriptions  Medication Sig Dispense Refill  . ACCU-CHEK AVIVA PLUS test strip       . APIDRA SOLOSTAR 100 UNIT/ML injection 25 Units. Patient takes prior to each meal      . Choline Fenofibrate (TRILIPIX) 135 MG capsule Take 135 mg by mouth daily.      . ciprofloxacin (CIPRO) 500 MG tablet 500 mg.       . DULoxetine (CYMBALTA) 60 MG capsule Take 60 mg by mouth daily.      . Insulin Glargine (LANTUS [place]) Inject 100 Units into the skin 2 (two) times daily.       . Levonorgestrel-Ethinyl Estrad (PORTIA-28 PO) Take by  mouth daily.      Marland Kitchen lisinopril-hydrochlorothiazide (PRINZIDE,ZESTORETIC) 20-12.5 MG per tablet Take 1 tablet by mouth daily.      . Probiotic Product (PROBIOTIC DAILY PO) Take by mouth daily. This is call Ultra Flora per the patient      . rosuvastatin (CRESTOR) 10 MG tablet Take 10 mg by mouth daily.      . Saxagliptin HCl (ONGLYZA PO) Take 60 mg by mouth daily.       No current facility-administered medications for this visit.     Objective: Blood pressure 112/76, pulse 78, temperature 100.4 F (38 C), temperature source Oral, resp. rate 18, height 5\' 3"  (1.6 m), weight 242 lb 3.7 oz (109.875 kg), last menstrual period 03/23/2013. Patient is alert and in no acute distress. Conjunctiva is pink. Sclera is nonicteric Oropharyngeal mucosa is normal. No neck masses or thyromegaly noted. Cardiac exam with regular rhythm normal S1 and S2. No murmur or gallop noted. Lungs are clear to auscultation. Abdomen is obese. Bowel sounds are hyperactive. Palpation reveals soft abdomen without tenderness organomegaly or masses. Liver edge is indistinct on the RCM.  No LE edema or clubbing noted.   Assessment:  #1. Intermittent diarrhea. Suspect she has irritable bowel syndrome. If she does not respond to therapy will proceed with testing for SIBO and pancreatic insufficiency #2. Alterable small hepatic lesions possibly atypical hemangiomas or hepatic adenomas. She had segment 4 of her liver removed back in September 2004 focal nodular  hyperplasia.   Plan:  Dicyclomine 10 mg by mouth twice a day. She will finish Cipro prescription. Stool tarry for 6-8 weeks and she'll send [place] a summary. If diarrhea persists will consider workup for small bowel bacterial overgrowth and exocrine pancreatic insufficiency. She will have MRI of liver in November 2014. Office visit in November 2014.

## 2013-04-26 ENCOUNTER — Ambulatory Visit (HOSPITAL_COMMUNITY)
Admission: RE | Admit: 2013-04-26 | Discharge: 2013-04-26 | Disposition: A | Discharge status: Home / Self Care | Payer: BC Managed Care – PPO | Source: Ambulatory Visit | Attending: *Deleted | Admitting: *Deleted

## 2013-04-26 ENCOUNTER — Other Ambulatory Visit (HOSPITAL_COMMUNITY): Payer: Self-pay | Admitting: *Deleted

## 2013-04-26 DIAGNOSIS — M545 Low back pain, unspecified: Secondary | ICD-10-CM | POA: Insufficient documentation

## 2013-04-26 DIAGNOSIS — R52 Pain, unspecified: Secondary | ICD-10-CM

## 2013-04-26 DIAGNOSIS — M47814 Spondylosis without myelopathy or radiculopathy, thoracic region: Secondary | ICD-10-CM | POA: Insufficient documentation

## 2013-04-26 DIAGNOSIS — M546 Pain in thoracic spine: Secondary | ICD-10-CM | POA: Insufficient documentation

## 2013-04-28 ENCOUNTER — Other Ambulatory Visit (HOSPITAL_COMMUNITY): Payer: Self-pay | Admitting: Internal Medicine

## 2013-04-28 ENCOUNTER — Other Ambulatory Visit: Payer: Self-pay | Admitting: Internal Medicine

## 2013-04-28 ENCOUNTER — Ambulatory Visit (HOSPITAL_COMMUNITY): Payer: BC Managed Care – PPO

## 2013-04-28 DIAGNOSIS — M47812 Spondylosis without myelopathy or radiculopathy, cervical region: Secondary | ICD-10-CM

## 2013-04-28 DIAGNOSIS — M4302 Spondylolysis, cervical region: Secondary | ICD-10-CM

## 2013-05-06 ENCOUNTER — Ambulatory Visit
Admission: RE | Admit: 2013-05-06 | Discharge: 2013-05-06 | Disposition: A | Discharge status: Home / Self Care | Payer: BC Managed Care – PPO | Source: Ambulatory Visit | Attending: Internal Medicine | Admitting: Internal Medicine

## 2013-05-06 DIAGNOSIS — M4302 Spondylolysis, cervical region: Secondary | ICD-10-CM

## 2013-06-19 ENCOUNTER — Other Ambulatory Visit (INDEPENDENT_AMBULATORY_CARE_PROVIDER_SITE_OTHER): Payer: Self-pay | Admitting: Internal Medicine

## 2013-06-19 ENCOUNTER — Telehealth (INDEPENDENT_AMBULATORY_CARE_PROVIDER_SITE_OTHER): Payer: Self-pay | Admitting: *Deleted

## 2013-06-19 MED ORDER — CIPROFLOXACIN HCL 500 MG PO TABS: 500.0000 mg | ORAL_TABLET | Freq: Two times a day (BID) | ORAL | Status: DC

## 2013-06-19 NOTE — Telephone Encounter (Signed)
Diarrhea for the past 6 days. Would like to see if Dr. Karilyn Cota would please call her in a antibiotic for small interest over growth. Her return phone number is [phone].

## 2013-06-19 NOTE — Telephone Encounter (Signed)
Routing to Dr. Rehman for review. 

## 2013-06-21 NOTE — Telephone Encounter (Signed)
Dr. Karilyn Cota e-scribed a Rx for Cipro on 06/19/13.

## 2013-10-10 ENCOUNTER — Encounter (INDEPENDENT_AMBULATORY_CARE_PROVIDER_SITE_OTHER): Payer: Self-pay | Admitting: *Deleted

## 2013-10-12 ENCOUNTER — Other Ambulatory Visit: Payer: Self-pay

## 2013-10-16 ENCOUNTER — Other Ambulatory Visit (INDEPENDENT_AMBULATORY_CARE_PROVIDER_SITE_OTHER): Payer: Self-pay | Admitting: Internal Medicine

## 2013-10-16 DIAGNOSIS — D134 Benign neoplasm of liver: Secondary | ICD-10-CM

## 2013-10-16 NOTE — Addendum Note (Signed)
Addended by: Malissa Hippo on: 10/16/2013 09:33 AM   Modules accepted: Orders

## 2013-10-16 NOTE — Addendum Note (Signed)
Addended by: Malissa Hippo on: 10/16/2013 09:34 AM   Modules accepted: Orders

## 2013-10-31 ENCOUNTER — Ambulatory Visit (INDEPENDENT_AMBULATORY_CARE_PROVIDER_SITE_OTHER): Payer: BC Managed Care – PPO | Admitting: Internal Medicine

## 2013-11-01 ENCOUNTER — Ambulatory Visit
Admission: RE | Admit: 2013-11-01 | Discharge: 2013-11-01 | Disposition: A | Discharge status: Home / Self Care | Payer: BC Managed Care – PPO | Source: Ambulatory Visit | Attending: Internal Medicine | Admitting: Internal Medicine

## 2013-11-01 ENCOUNTER — Other Ambulatory Visit (HOSPITAL_COMMUNITY): Payer: BC Managed Care – PPO

## 2013-11-01 DIAGNOSIS — D134 Benign neoplasm of liver: Secondary | ICD-10-CM

## 2013-11-01 MED ORDER — GADOXETATE DISODIUM 0.25 MMOL/ML IV SOLN
10.0000 mL | Freq: Once | INTRAVENOUS | Status: AC | PRN
  Administered 2013-11-01: 10 mL via INTRAVENOUS

## 2013-11-08 NOTE — Progress Notes (Signed)
LM for patient to return the call.  

## 2013-11-16 NOTE — Progress Notes (Signed)
LM on home and cell number to return the call to schedule a f/u apt with Dr. Karilyn Cota.

## 2013-12-12 NOTE — Progress Notes (Signed)
LM for patient to return the call.  

## 2013-12-22 NOTE — Progress Notes (Signed)
LM to return the call at Dr. Rehman's office.  

## 2013-12-28 ENCOUNTER — Encounter (INDEPENDENT_AMBULATORY_CARE_PROVIDER_SITE_OTHER): Payer: Self-pay | Admitting: *Deleted

## 2013-12-28 NOTE — Progress Notes (Signed)
Tamara Solomon has left several message for the patient to return the call to get an appointment scheduled. Mailed a letter on 12/28/13.

## 2014-11-28 ENCOUNTER — Encounter (INDEPENDENT_AMBULATORY_CARE_PROVIDER_SITE_OTHER): Payer: Self-pay

## 2015-07-25 ENCOUNTER — Encounter: Payer: Self-pay | Admitting: *Deleted

## 2015-08-19 ENCOUNTER — Encounter: Payer: Self-pay | Admitting: Cardiovascular Disease

## 2016-10-02 ENCOUNTER — Other Ambulatory Visit: Payer: Self-pay | Admitting: Internal Medicine

## 2016-10-02 DIAGNOSIS — R748 Abnormal levels of other serum enzymes: Secondary | ICD-10-CM

## 2016-10-02 DIAGNOSIS — D490 Neoplasm of unspecified behavior of digestive system: Secondary | ICD-10-CM

## 2016-10-15 ENCOUNTER — Ambulatory Visit
Admission: RE | Admit: 2016-10-15 | Discharge: 2016-10-15 | Disposition: A | Discharge status: Home / Self Care | Payer: BC Managed Care – PPO | Source: Ambulatory Visit | Attending: Internal Medicine | Admitting: Internal Medicine

## 2016-10-15 DIAGNOSIS — R748 Abnormal levels of other serum enzymes: Secondary | ICD-10-CM

## 2016-10-15 DIAGNOSIS — D490 Neoplasm of unspecified behavior of digestive system: Secondary | ICD-10-CM

## 2016-10-15 MED ORDER — GADOXETATE DISODIUM 0.25 MMOL/ML IV SOLN
10.0000 mL | Freq: Once | INTRAVENOUS | Status: AC | PRN
  Administered 2016-10-15: 10 mL via INTRAVENOUS

## 2018-06-06 ENCOUNTER — Ambulatory Visit (HOSPITAL_COMMUNITY)
Admission: RE | Admit: 2018-06-06 | Discharge: 2018-06-06 | Disposition: A | Discharge status: Home / Self Care | Payer: BC Managed Care – PPO | Source: Ambulatory Visit | Attending: Adult Health Nurse Practitioner | Admitting: Adult Health Nurse Practitioner

## 2018-06-06 ENCOUNTER — Other Ambulatory Visit (HOSPITAL_COMMUNITY): Payer: Self-pay | Admitting: Adult Health Nurse Practitioner

## 2018-06-06 DIAGNOSIS — M5136 Other intervertebral disc degeneration, lumbar region: Secondary | ICD-10-CM | POA: Diagnosis not present

## 2018-06-06 DIAGNOSIS — M5440 Lumbago with sciatica, unspecified side: Secondary | ICD-10-CM | POA: Diagnosis present

## 2018-06-07 ENCOUNTER — Other Ambulatory Visit: Payer: Self-pay | Admitting: Internal Medicine

## 2018-06-07 DIAGNOSIS — M545 Low back pain: Secondary | ICD-10-CM

## 2018-06-10 ENCOUNTER — Ambulatory Visit
Admission: RE | Admit: 2018-06-10 | Discharge: 2018-06-10 | Disposition: A | Discharge status: Home / Self Care | Payer: BC Managed Care – PPO | Source: Ambulatory Visit | Attending: Internal Medicine | Admitting: Internal Medicine

## 2018-06-10 DIAGNOSIS — M545 Low back pain: Secondary | ICD-10-CM

## 2018-06-13 ENCOUNTER — Other Ambulatory Visit: Payer: Self-pay | Admitting: Internal Medicine

## 2018-06-13 DIAGNOSIS — M545 Low back pain: Secondary | ICD-10-CM

## 2018-06-16 ENCOUNTER — Ambulatory Visit
Admission: RE | Admit: 2018-06-16 | Discharge: 2018-06-16 | Disposition: A | Discharge status: Home / Self Care | Payer: BC Managed Care – PPO | Source: Ambulatory Visit | Attending: Internal Medicine | Admitting: Internal Medicine

## 2018-06-16 DIAGNOSIS — M545 Low back pain: Secondary | ICD-10-CM

## 2018-12-19 ENCOUNTER — Other Ambulatory Visit: Payer: Self-pay | Admitting: Internal Medicine

## 2018-12-19 ENCOUNTER — Other Ambulatory Visit (HOSPITAL_COMMUNITY): Payer: Self-pay | Admitting: Internal Medicine

## 2018-12-19 DIAGNOSIS — R1011 Right upper quadrant pain: Secondary | ICD-10-CM

## 2018-12-24 ENCOUNTER — Encounter (HOSPITAL_COMMUNITY): Payer: Self-pay

## 2018-12-24 ENCOUNTER — Other Ambulatory Visit (HOSPITAL_COMMUNITY): Payer: Self-pay | Admitting: Internal Medicine

## 2018-12-24 ENCOUNTER — Ambulatory Visit (HOSPITAL_COMMUNITY)
Admission: RE | Admit: 2018-12-24 | Discharge: 2018-12-24 | Disposition: A | Discharge status: Home / Self Care | Payer: BC Managed Care – PPO | Source: Ambulatory Visit | Attending: Internal Medicine | Admitting: Internal Medicine

## 2018-12-24 DIAGNOSIS — M79641 Pain in right hand: Secondary | ICD-10-CM | POA: Diagnosis present

## 2018-12-24 DIAGNOSIS — M79644 Pain in right finger(s): Secondary | ICD-10-CM | POA: Insufficient documentation

## 2018-12-24 DIAGNOSIS — S62634A Displaced fracture of distal phalanx of right ring finger, initial encounter for closed fracture: Secondary | ICD-10-CM | POA: Diagnosis not present

## 2018-12-24 DIAGNOSIS — W19XXXA Unspecified fall, initial encounter: Secondary | ICD-10-CM

## 2018-12-31 ENCOUNTER — Ambulatory Visit
Admission: RE | Admit: 2018-12-31 | Discharge: 2018-12-31 | Disposition: A | Discharge status: Home / Self Care | Payer: BC Managed Care – PPO | Source: Ambulatory Visit | Attending: Internal Medicine | Admitting: Internal Medicine

## 2018-12-31 DIAGNOSIS — R1011 Right upper quadrant pain: Secondary | ICD-10-CM

## 2018-12-31 MED ORDER — GADOBENATE DIMEGLUMINE 529 MG/ML IV SOLN
20.0000 mL | Freq: Once | INTRAVENOUS | Status: AC | PRN
  Administered 2018-12-31: 20 mL via INTRAVENOUS

## 2019-05-16 ENCOUNTER — Other Ambulatory Visit: Payer: Self-pay | Admitting: Neurosurgery

## 2019-05-16 DIAGNOSIS — M48062 Spinal stenosis, lumbar region with neurogenic claudication: Secondary | ICD-10-CM

## 2019-05-17 ENCOUNTER — Ambulatory Visit
Admission: RE | Admit: 2019-05-17 | Discharge: 2019-05-17 | Disposition: A | Discharge status: Home / Self Care | Payer: BC Managed Care – PPO | Source: Ambulatory Visit | Attending: Neurosurgery | Admitting: Neurosurgery

## 2019-05-17 ENCOUNTER — Other Ambulatory Visit: Payer: Self-pay

## 2019-05-17 DIAGNOSIS — M48062 Spinal stenosis, lumbar region with neurogenic claudication: Secondary | ICD-10-CM

## 2019-05-19 ENCOUNTER — Other Ambulatory Visit: Payer: Self-pay | Admitting: Neurosurgery

## 2019-05-24 ENCOUNTER — Encounter (HOSPITAL_COMMUNITY): Payer: Self-pay

## 2019-05-24 NOTE — Pre-Procedure Instructions (Signed)
Tamara Solomon  05/24/2019     [facility] - [place], [address] [place] [address] Phone: [phone] Fax: [phone]   Your procedure is scheduled on Monday, June 22nd  Report to [facility] Entrance A at 9:05 A.M.  Call this number if you have problems the morning of surgery:  [phone]   Remember:  Do not eat or drink after midnight.    Take these medicines the morning of surgery with A SIP OF WATER  DULoxetine (CYMBALTA)  gabapentin (NEURONTIN)  omeprazole (PRILOSEC)  rosuvastatin (CRESTOR)    If needed - acetaminophen (TYLENOL)   As of today, STOP taking any Aspirin (unless otherwise instructed by your surgeon), diclofenac sodium (VOLTAREN)  Aleve, Naproxen, Ibuprofen, Motrin, Advil, Goody's, BC's, all herbal medications, fish oil, and all vitamins.  How to Manage Your Diabetes Before and After Surgery  Why is it important to control my blood sugar before and after surgery? . Improving blood sugar levels before and after surgery helps healing and can limit problems. . A way of improving blood sugar control is eating a healthy diet by: o  Eating less sugar and carbohydrates o  Increasing activity/exercise o  Talking with your doctor about reaching your blood sugar goals . High blood sugars (greater than 180 mg/dL) can raise your risk of infections and slow your recovery, so you will need to focus on controlling your diabetes during the weeks before surgery. . Make sure that the doctor who takes care of your diabetes knows about your planned surgery including the date and location.  How do I manage my blood sugar before surgery? . Check your blood sugar at least 4 times a day, starting 2 days before surgery, to make sure that the level is not too high or low. o Check your blood sugar the morning of your surgery when you wake up and every 2 hours until you get to the Short Stay unit. . If your blood sugar is less than  70 mg/dL, you will need to treat for low blood sugar: o Do not take insulin. o Treat a low blood sugar (less than 70 mg/dL) with  cup of clear juice (cranberry or apple), 4 glucose tablets, OR glucose gel. Recheck blood sugar in 15 minutes after treatment (to make sure it is greater than 70 mg/dL). If your blood sugar is not greater than 70 mg/dL on recheck, call [phone] o  for further instructions. . Report your blood sugar to the short stay nurse when you get to Short Stay.  . If you are admitted to the hospital after surgery: o Your blood sugar will be checked by the staff and you will probably be given insulin after surgery (instead of oral diabetes medicines) to make sure you have good blood sugar levels. o The goal for blood sugar control after surgery is 80-180 mg/dL.   WHAT DO I DO ABOUT MY DIABETES MEDICATION?  . Hold/ Do not take dayempaliflozin (Jardiance) the day before surgery  . Do not take empagliflozin (JARDIANCE) oral diabetes medicines (pills) the morning of surgery. . Do not take sitaGLIPtin (JANUVIA)  oral diabetes medicines (pills) the morning of surgery.  . Do not take nsulin aspart (NOVOLOG FLEXPEN) the night before surgery  . THE MORNING OF SURGERY, take 75 units ofInsulin Degludec (TRESIBA FLEXTOUCH)   . If your CBG is greater than 220 mg/dL, you may take  of your sliding scale (correction) dose of insulin.  Reviewed and Endorsed by [facility] Patient Education Committee, August 2015  Special instructions:   [facility]- Preparing For Surgery  Before surgery, you can play an important role. Because skin is not sterile, your skin needs to be as free of germs as possible. You can reduce the number of germs on your skin by washing with CHG (chlorahexidine gluconate) Soap before surgery.  CHG is an antiseptic cleaner which kills germs and bonds with the skin to continue killing germs even after washing.    Oral Hygiene is also important to reduce your risk  of infection.  Remember - BRUSH YOUR TEETH THE MORNING OF SURGERY WITH YOUR REGULAR TOOTHPASTE  Please do not use if you have an allergy to CHG or antibacterial soaps. If your skin becomes reddened/irritated stop using the CHG.  Do not shave (including legs and underarms) for at least 48 hours prior to first CHG shower. It is OK to shave your face.  Please follow these instructions carefully.   1. Shower the NIGHT BEFORE SURGERY and the MORNING OF SURGERY with CHG.   2. If you chose to wash your hair, wash your hair first as usual with your normal shampoo.  3. After you shampoo, rinse your hair and body thoroughly to remove the shampoo.  4. Use CHG as you would any other liquid soap. You can apply CHG directly to the skin and wash gently with a scrungie or a clean washcloth.   5. Apply the CHG Soap to your body ONLY FROM THE NECK DOWN.  Do not use on open wounds or open sores. Avoid contact with your eyes, ears, mouth and genitals (private parts). Wash Face and genitals (private parts)  with your normal soap.  6. Wash thoroughly, paying special attention to the area where your surgery will be performed.  7. Thoroughly rinse your body with warm water from the neck down.  8. DO NOT shower/wash with your normal soap after using and rinsing off the CHG Soap.  9. Pat yourself dry with a CLEAN TOWEL.  10. Wear CLEAN PAJAMAS to bed the night before surgery, wear comfortable clothes the morning of surgery  11. Place CLEAN SHEETS on your bed the night of your first shower and DO NOT SLEEP WITH PETS.  Day of Surgery: Do not wear jewelry, make-up or nail polish.  Do not wear lotions, powders, or perfumes, or deodorant.  Do not shave 48 hours prior to surgery.    Do not bring valuables to the hospital.  [facility] is not responsible for any belongings or valuables.  Please wear clean clothes to the hospital/surgery center.   Remember to brush your teeth WITH YOUR REGULAR  TOOTHPASTE.  Contacts, dentures or bridgework may not be worn into surgery.  Leave your suitcase in the car.  After surgery it may be brought to your room.  For patients admitted to the hospital, discharge time will be determined by your treatment team.  Patients discharged the day of surgery will not be allowed to drive home.   Please read over the following fact sheets that you were given. Pain Booklet, Coughing and Deep Breathing, MRSA Information and Surgical Site Infection Prevention

## 2019-05-25 ENCOUNTER — Other Ambulatory Visit: Payer: Self-pay

## 2019-05-25 ENCOUNTER — Other Ambulatory Visit: Payer: Self-pay | Admitting: Neurosurgery

## 2019-05-25 ENCOUNTER — Encounter (HOSPITAL_COMMUNITY): Payer: Self-pay

## 2019-05-25 ENCOUNTER — Other Ambulatory Visit (HOSPITAL_COMMUNITY)
Admission: RE | Admit: 2019-05-25 | Discharge: 2019-05-25 | Disposition: A | Discharge status: Home / Self Care | Payer: BC Managed Care – PPO | Source: Ambulatory Visit | Attending: Neurosurgery | Admitting: Neurosurgery

## 2019-05-25 ENCOUNTER — Encounter (HOSPITAL_COMMUNITY)
Admission: RE | Admit: 2019-05-25 | Discharge: 2019-05-25 | Disposition: A | Discharge status: Home / Self Care | Payer: BC Managed Care – PPO | Source: Ambulatory Visit | Attending: Neurosurgery | Admitting: Neurosurgery

## 2019-05-25 DIAGNOSIS — K760 Fatty (change of) liver, not elsewhere classified: Secondary | ICD-10-CM | POA: Diagnosis not present

## 2019-05-25 DIAGNOSIS — Z01818 Encounter for other preprocedural examination: Secondary | ICD-10-CM | POA: Insufficient documentation

## 2019-05-25 DIAGNOSIS — K219 Gastro-esophageal reflux disease without esophagitis: Secondary | ICD-10-CM | POA: Insufficient documentation

## 2019-05-25 DIAGNOSIS — K589 Irritable bowel syndrome without diarrhea: Secondary | ICD-10-CM | POA: Insufficient documentation

## 2019-05-25 DIAGNOSIS — Z1159 Encounter for screening for other viral diseases: Secondary | ICD-10-CM | POA: Insufficient documentation

## 2019-05-25 DIAGNOSIS — Z79899 Other long term (current) drug therapy: Secondary | ICD-10-CM | POA: Insufficient documentation

## 2019-05-25 DIAGNOSIS — E785 Hyperlipidemia, unspecified: Secondary | ICD-10-CM | POA: Diagnosis not present

## 2019-05-25 DIAGNOSIS — E119 Type 2 diabetes mellitus without complications: Secondary | ICD-10-CM | POA: Diagnosis not present

## 2019-05-25 DIAGNOSIS — I10 Essential (primary) hypertension: Secondary | ICD-10-CM | POA: Diagnosis not present

## 2019-05-25 DIAGNOSIS — M48062 Spinal stenosis, lumbar region with neurogenic claudication: Secondary | ICD-10-CM | POA: Diagnosis not present

## 2019-05-25 DIAGNOSIS — M1990 Unspecified osteoarthritis, unspecified site: Secondary | ICD-10-CM | POA: Insufficient documentation

## 2019-05-25 DIAGNOSIS — Z794 Long term (current) use of insulin: Secondary | ICD-10-CM | POA: Diagnosis not present

## 2019-05-25 HISTORY — DX: Family history of other specified conditions: Z84.89

## 2019-05-25 HISTORY — DX: Unspecified osteoarthritis, unspecified site: M19.90

## 2019-05-25 HISTORY — DX: Other specified postprocedural states: Z98.890

## 2019-05-25 HISTORY — DX: Other specified postprocedural states: R11.2

## 2019-05-25 HISTORY — DX: Other complications of anesthesia, initial encounter: T88.59XA

## 2019-05-25 HISTORY — DX: Anxiety disorder, unspecified: F41.9

## 2019-05-25 LAB — CBC
HCT: 45.8 % (ref 36.0–46.0)
Hemoglobin: 15 g/dL (ref 12.0–15.0)
MCH: 30 pg (ref 26.0–34.0)
MCHC: 32.8 g/dL (ref 30.0–36.0)
MCV: 91.6 fL (ref 80.0–100.0)
Platelets: 292 10*3/uL (ref 150–400)
RBC: 5 MIL/uL (ref 3.87–5.11)
RDW: 12.2 % (ref 11.5–15.5)
WBC: 7.1 10*3/uL (ref 4.0–10.5)
nRBC: 0 % (ref 0.0–0.2)

## 2019-05-25 LAB — TYPE AND SCREEN
ABO/RH(D): O NEG
Antibody Screen: NEGATIVE

## 2019-05-25 LAB — BASIC METABOLIC PANEL
Anion gap: 9 (ref 5–15)
BUN: 8 mg/dL (ref 6–20)
CO2: 24 mmol/L (ref 22–32)
Calcium: 9.7 mg/dL (ref 8.9–10.3)
Chloride: 103 mmol/L (ref 98–111)
Creatinine, Ser: 0.69 mg/dL (ref 0.44–1.00)
GFR calc Af Amer: >60 mL/min (ref >60)
GFR calc non Af Amer: >60 mL/min (ref >60)
Glucose, Bld: 298 mg/dL — ABNORMAL HIGH (ref 70–99)
Potassium: 4 mmol/L (ref 3.5–5.1)
Sodium: 136 mmol/L (ref 135–145)

## 2019-05-25 LAB — SURGICAL PCR SCREEN
MRSA, PCR: NEGATIVE
Staphylococcus aureus: NEGATIVE

## 2019-05-25 LAB — HEMOGLOBIN A1C
Hgb A1c MFr Bld: 8.9 % — ABNORMAL HIGH (ref 4.8–5.6)
Mean Plasma Glucose: 208.73 mg/dL

## 2019-05-25 LAB — GLUCOSE, CAPILLARY: Glucose-Capillary: 382 mg/dL — ABNORMAL HIGH (ref 70–99)

## 2019-05-25 LAB — ABO/RH: ABO/RH(D): O NEG

## 2019-05-25 LAB — SARS CORONAVIRUS 2 (TAT 6-24 HRS): SARS Coronavirus 2: NEGATIVE

## 2019-05-25 MED ORDER — CHLORHEXIDINE GLUCONATE CLOTH 2 % EX PADS: 6.0000 | MEDICATED_PAD | Freq: Once | CUTANEOUS | Status: DC

## 2019-05-25 NOTE — Progress Notes (Signed)
PCP - Wende Neighbors  Cardiologist -   Chest x-ray -  EKG - done today Stress Test -  ECHO -  Cardiac Cath -   Sleep Study - none CPAP -   Fasting Blood Sugar - usually 140-180 per pt.   Today found to be 382- reports that she had all her meds that are for a.m.were taken this a.m. , She reports that she had a egg sandwich this a.m. & a coke  Checks Blood Sugar    2  _____ times a day  Blood Thinner Instructions: Aspirin Instructions:  Anesthesia review: will send chart for further review  Patient denies shortness of breath, fever, cough and chest pain at PAT appointment   Patient verbalized understanding of instructions that were given to them at the PAT appointment. Patient was also instructed that they will need to review over the PAT instructions again at home before surgery.

## 2019-05-25 NOTE — Progress Notes (Signed)
Call to Tamara Solomon. Consult regarding pt. With 382 blood sugar at entrance to PAT appt. Pt. Denies S&S of hyperglycemia. Pt. Cautioned about the need to monitor closely her dietary intake leading up to surgery coupled with checking BS 4 times per day. Pt. doesnot have a sliding scale insulin , so at instructions she was told to call [place] the AM of her surgery if her BS is >220. Chart will be forwarded to Anesth. For further review.

## 2019-05-26 NOTE — Anesthesia Preprocedure Evaluation (Addendum)
Anesthesia Evaluation  Patient identified by MRN, date of birth, ID band Patient awake    Reviewed: Allergy & Precautions, NPO status , Patient's Chart, lab work & pertinent test results  History of Anesthesia Complications (+) PONV  Airway Mallampati: I  TM Distance: >3 FB Neck ROM: Full    Dental   Pulmonary    Pulmonary exam normal        Cardiovascular hypertension, Pt. on medications Normal cardiovascular exam     Neuro/Psych Anxiety Depression    GI/Hepatic GERD  Medicated and Controlled,  Endo/Other  diabetes, Type 2, Insulin Dependent  Renal/GU      Musculoskeletal   Abdominal   Peds  Hematology   Anesthesia Other Findings   Reproductive/Obstetrics                           Anesthesia Physical Anesthesia Plan  ASA: III  Anesthesia Plan: General   Post-op Pain Management:    Induction: Intravenous  PONV Risk Score and Plan: 4 or greater and Ondansetron, Midazolam, Scopolamine patch - Pre-op and Dexamethasone  Airway Management Planned: Oral ETT  Additional Equipment:   Intra-op Plan:   Post-operative Plan: Extubation in OR  Informed Consent: I have reviewed the patients History and Physical, chart, labs and discussed the procedure including the risks, benefits and alternatives for the proposed anesthesia with the patient or authorized representative who has indicated his/her understanding and acceptance.       Plan Discussed with: CRNA and Surgeon  Anesthesia Plan Comments: (PAT note written 05/26/2019 by Myra Gianotti, PA-C. )       Anesthesia Quick Evaluation

## 2019-05-26 NOTE — Progress Notes (Signed)
Anesthesia Chart Review:  Case: [id number] Date/Time: 05/29/19 1051   Procedure: LUMBAR 5- SACRAL 1 LUMBAR DECOMPRESSION,POSTERIOR LUMBAR INTERBODY FUSION, POSTERIOR LATERAL ARTHRODESIS (N/A Back) - LUMBAR 5- SACRAL 1 LUMBAR DECOMPRESSION,POSTERIOR LUMBAR INTERBODY FUSION, POSTERIOR LATERAL ARTHRODESIS   Anesthesia type: General   Pre-op diagnosis: LUMBAR STENOSIS WOTH NEUROGENIC CLAUDICATION   Location: [facility] ROOM 20 / [facility]   Surgeon: Jovita Gamma, MD      DISCUSSION: Patient is a 48 year old female scheduled for the above procedure.  History includes never smoker, post-operative N/V, DM2, HTN, GERD, HLD, IBS, liver surgery (not otherwise specified, 2000; benign appearing hepatic masses, mild hepatic steatosis 12/2018 MRI). BMI is consistent with obesity.  Patient was hyperglycemic (non-fasting) at PAT visit (382, 298), A1c 8.9%. Nikki with Dr. Donnella Bi aware. She notified patient that if glucose significantly elevated on the day of surgery that case could be delayed or cancelled.   05/25/19 preoperative COVID 19 test negative. She will need a CBG and urine pregnancy test on the day of surgery. If results acceptable and otherwise no acute changes and I would anticipate that she could proceed as planned.   VS: BP (!) 155/84   Pulse 93   Temp 36.7 C   Resp 20   Ht 5\' 3"  (1.6 m)   Wt 113.9 kg   LMP 05/11/2019   SpO2 99%   BMI 44.50 kg/m   PROVIDERS: Celene Squibb, MD is PCP   LABS: Preoperative labs noted. Random CBG 382 and 298 by serum labs. A1c 8.9. (all labs ordered are listed, but only abnormal results are displayed)  Labs Reviewed  GLUCOSE, CAPILLARY - Abnormal; Notable for the following components:      Result Value   Glucose-Capillary 382 (*)    All other components within normal limits  HEMOGLOBIN A1C - Abnormal; Notable for the following components:   Hgb A1c MFr Bld 8.9 (*)    All other components within normal limits  BASIC METABOLIC PANEL - Abnormal; Notable  for the following components:   Glucose, Bld 298 (*)    All other components within normal limits  SURGICAL PCR SCREEN  SARS CORONAVIRUS 2 (PERFORMED IN [facility])  CBC  TYPE AND SCREEN  ABO/RH    IMAGES: MRI L-spine 05/17/19: IMPRESSION: 1. Progressive L5-S1 disc degeneration with severe right greater than left neural foraminal stenosis. 2. Unchanged disc and facet degeneration elsewhere as above.  MRI abdomen 12/31/18: IMPRESSION: - Multiple small hepatic masses remain stable, consistent with benign etiology. Imaging characteristics favor focal nodular hyperplasia over benign adenomas. - Mild hepatic steatosis.   EKG: 05/25/19: Normal sinus rhythm Low voltage QRS Borderline ECG Confirmed by Adrian Prows ([id number]) on 05/25/2019 9:56:16 PM   CV: Echo 02/16/12 (former [facility]): Summary: Technically difficult study. There is mild concentric LVH. Left ventricular systolic function is normal.  Ejection fraction > 55%. The transmitral septal Doppler flow pattern is normal for age. The left atrial size is normal.   Right ventricular systolic pressure is normal. No valvular disease noted.  Nuclear stress test 02/16/12 (former [facility]): Impression: Small area of breast attenuation artifact.  No reversible ischemia.  Post stress EF 82%.  No significant wall motion abnormalities noted.  No Lexiscan EKG changes.  Nondiagnostic for ischemia.  This is a low risk scan.   Past Medical History:  Diagnosis Date  . Anxiety   . Arthritis    lumbar stenosis  . Complication of anesthesia   . Diabetes mellitus   .  Family history of adverse reaction to anesthesia    family history of N&V  . GERD (gastroesophageal reflux disease)   . Hyperlipidemia   . Hypertension   . Irritable bowel syndrome   . PONV (postoperative nausea and vomiting)     Past Surgical History:  Procedure Laterality Date  . CARPAL TUNNEL RELEASE     Patient has had three times - 2  times right hand, one time to the left  . CERVICAL CONE BIOPSY  2009  . CHOLECYSTECTOMY  1997  . COLONOSCOPY    . DILATION AND CURETTAGE OF UTERUS  2009  . HERNIA REPAIR  2004  . LIVER SURGERY  2000   Liver resection  . NM MYOCAR PERF WALL MOTION  02/16/2012   low risk study.  Small area of breast attenuation  . TONSILLECTOMY  1978  . TRIGGER FINGER RELEASE Bilateral    several times  . UPPER GASTROINTESTINAL ENDOSCOPY    . US ECHOCARDIOGRAPHY  02/16/2012   mild concentric LVH, EF >55%    MEDICATIONS: . ACCU-CHEK AVIVA PLUS test strip  . acetaminophen (TYLENOL) 500 MG tablet  . APPLE CIDER VINEGAR PO  . Choline Fenofibrate (TRILIPIX) 135 MG capsule  . diclofenac sodium (VOLTAREN) 1 % GEL  . DULoxetine (CYMBALTA) 60 MG capsule  . empagliflozin (JARDIANCE) 25 MG TABS tablet  . gabapentin (NEURONTIN) 400 MG capsule  . insulin aspart (NOVOLOG FLEXPEN) 100 UNIT/ML FlexPen  . Insulin Degludec (TRESIBA FLEXTOUCH) 200 UNIT/ML SOPN  . Levonorgestrel-Ethinyl Estrad (PORTIA-28 PO)  . lisinopril-hydrochlorothiazide (PRINZIDE,ZESTORETIC) 20-12.5 MG per tablet  . omeprazole (PRILOSEC) 20 MG capsule  . rosuvastatin (CRESTOR) 40 MG tablet  . sitaGLIPtin (JANUVIA) 100 MG tablet  . Turmeric 500 MG CAPS   No current facility-administered medications for this encounter.     Myra Gianotti, PA-C Surgical Short Stay/Anesthesiology [facility] Phone [phone] [facility] Phone [phone] 05/26/2019 10:26 AM

## 2019-05-29 ENCOUNTER — Encounter (HOSPITAL_COMMUNITY)
Admission: RE | Disposition: A | Discharge status: Home / Self Care | Payer: Self-pay | Source: Home / Self Care | Attending: Neurosurgery

## 2019-05-29 ENCOUNTER — Inpatient Hospital Stay (HOSPITAL_COMMUNITY): Payer: BC Managed Care – PPO

## 2019-05-29 ENCOUNTER — Inpatient Hospital Stay (HOSPITAL_COMMUNITY)
Admission: RE | Admit: 2019-05-29 | Discharge: 2019-05-30 | DRG: 455 | Disposition: A | Discharge status: Home / Self Care | Payer: BC Managed Care – PPO | Attending: Neurosurgery | Admitting: Neurosurgery

## 2019-05-29 ENCOUNTER — Other Ambulatory Visit: Payer: Self-pay

## 2019-05-29 ENCOUNTER — Inpatient Hospital Stay (HOSPITAL_COMMUNITY): Payer: BC Managed Care – PPO | Admitting: Physician Assistant

## 2019-05-29 ENCOUNTER — Encounter (HOSPITAL_COMMUNITY): Payer: Self-pay | Admitting: *Deleted

## 2019-05-29 ENCOUNTER — Inpatient Hospital Stay (HOSPITAL_COMMUNITY): Payer: BC Managed Care – PPO | Admitting: Certified Registered Nurse Anesthetist

## 2019-05-29 DIAGNOSIS — Z888 Allergy status to other drugs, medicaments and biological substances status: Secondary | ICD-10-CM

## 2019-05-29 DIAGNOSIS — Z793 Long term (current) use of hormonal contraceptives: Secondary | ICD-10-CM

## 2019-05-29 DIAGNOSIS — Z885 Allergy status to narcotic agent status: Secondary | ICD-10-CM

## 2019-05-29 DIAGNOSIS — Z833 Family history of diabetes mellitus: Secondary | ICD-10-CM

## 2019-05-29 DIAGNOSIS — K219 Gastro-esophageal reflux disease without esophagitis: Secondary | ICD-10-CM | POA: Diagnosis present

## 2019-05-29 DIAGNOSIS — Z419 Encounter for procedure for purposes other than remedying health state, unspecified: Secondary | ICD-10-CM

## 2019-05-29 DIAGNOSIS — M48062 Spinal stenosis, lumbar region with neurogenic claudication: Secondary | ICD-10-CM | POA: Diagnosis present

## 2019-05-29 DIAGNOSIS — M4807 Spinal stenosis, lumbosacral region: Principal | ICD-10-CM | POA: Diagnosis present

## 2019-05-29 DIAGNOSIS — Z794 Long term (current) use of insulin: Secondary | ICD-10-CM

## 2019-05-29 DIAGNOSIS — I10 Essential (primary) hypertension: Secondary | ICD-10-CM | POA: Diagnosis present

## 2019-05-29 DIAGNOSIS — M5116 Intervertebral disc disorders with radiculopathy, lumbar region: Secondary | ICD-10-CM | POA: Diagnosis present

## 2019-05-29 DIAGNOSIS — E785 Hyperlipidemia, unspecified: Secondary | ICD-10-CM | POA: Diagnosis present

## 2019-05-29 DIAGNOSIS — Z9049 Acquired absence of other specified parts of digestive tract: Secondary | ICD-10-CM | POA: Diagnosis not present

## 2019-05-29 DIAGNOSIS — E119 Type 2 diabetes mellitus without complications: Secondary | ICD-10-CM | POA: Diagnosis present

## 2019-05-29 DIAGNOSIS — Z800 Family history of malignant neoplasm of digestive organs: Secondary | ICD-10-CM | POA: Diagnosis not present

## 2019-05-29 DIAGNOSIS — Z79899 Other long term (current) drug therapy: Secondary | ICD-10-CM

## 2019-05-29 DIAGNOSIS — M4727 Other spondylosis with radiculopathy, lumbosacral region: Secondary | ICD-10-CM | POA: Diagnosis present

## 2019-05-29 LAB — GLUCOSE, CAPILLARY
Glucose-Capillary: 150 mg/dL — ABNORMAL HIGH (ref 70–99)
Glucose-Capillary: 192 mg/dL — ABNORMAL HIGH (ref 70–99)
Glucose-Capillary: 197 mg/dL — ABNORMAL HIGH (ref 70–99)
Glucose-Capillary: 315 mg/dL — ABNORMAL HIGH (ref 70–99)

## 2019-05-29 LAB — POCT PREGNANCY, URINE: Preg Test, Ur: NEGATIVE

## 2019-05-29 SURGERY — POSTERIOR LUMBAR FUSION 1 LEVEL
Anesthesia: General | Site: Back

## 2019-05-29 MED ORDER — SODIUM CHLORIDE 0.9 % IV SOLN: 250.0000 mL | INTRAVENOUS | Status: DC

## 2019-05-29 MED ORDER — CEFAZOLIN SODIUM-DEXTROSE 2-4 GM/100ML-% IV SOLN
INTRAVENOUS | Status: AC
  Filled 2019-05-29: qty 100

## 2019-05-29 MED ORDER — ACETAMINOPHEN 10 MG/ML IV SOLN
INTRAVENOUS | Status: AC
  Filled 2019-05-29: qty 100

## 2019-05-29 MED ORDER — METOCLOPRAMIDE HCL 5 MG/ML IJ SOLN
10.0000 mg | Freq: Once | INTRAMUSCULAR | Status: AC
  Administered 2019-05-29: 10 mg via INTRAVENOUS

## 2019-05-29 MED ORDER — ONDANSETRON HCL 4 MG/2ML IJ SOLN
INTRAMUSCULAR | Status: DC | PRN
  Administered 2019-05-29: 4 mg via INTRAVENOUS

## 2019-05-29 MED ORDER — FLEET ENEMA 7-19 GM/118ML RE ENEM: 1.0000 | ENEMA | Freq: Once | RECTAL | Status: DC | PRN

## 2019-05-29 MED ORDER — ACETAMINOPHEN 650 MG RE SUPP: 650.0000 mg | RECTAL | Status: DC | PRN

## 2019-05-29 MED ORDER — MAGNESIUM HYDROXIDE 400 MG/5ML PO SUSP: 30.0000 mL | Freq: Every day | ORAL | Status: DC | PRN

## 2019-05-29 MED ORDER — METOCLOPRAMIDE HCL 5 MG/ML IJ SOLN
INTRAMUSCULAR | Status: AC
  Filled 2019-05-29: qty 2

## 2019-05-29 MED ORDER — THROMBIN 20000 UNITS EX SOLR
CUTANEOUS | Status: DC | PRN
  Administered 2019-05-29: 20 mL via TOPICAL

## 2019-05-29 MED ORDER — PHENOL 1.4 % MT LIQD: 1.0000 | OROMUCOSAL | Status: DC | PRN

## 2019-05-29 MED ORDER — PANTOPRAZOLE SODIUM 40 MG PO TBEC: 80.0000 mg | DELAYED_RELEASE_TABLET | Freq: Every day | ORAL | Status: DC

## 2019-05-29 MED ORDER — MIDAZOLAM HCL 2 MG/2ML IJ SOLN
INTRAMUSCULAR | Status: DC | PRN
  Administered 2019-05-29: 2 mg via INTRAVENOUS

## 2019-05-29 MED ORDER — PROPOFOL 10 MG/ML IV BOLUS
INTRAVENOUS | Status: AC
  Filled 2019-05-29: qty 20

## 2019-05-29 MED ORDER — INSULIN ASPART 100 UNIT/ML FLEXPEN: 50.0000 [IU] | PEN_INJECTOR | Freq: Three times a day (TID) | SUBCUTANEOUS | Status: DC

## 2019-05-29 MED ORDER — BISACODYL 10 MG RE SUPP: 10.0000 mg | Freq: Every day | RECTAL | Status: DC | PRN

## 2019-05-29 MED ORDER — LEVONORGESTREL-ETHINYL ESTRAD 0.15-30 MG-MCG PO TABS: 1.0000 | ORAL_TABLET | Freq: Every day | ORAL | Status: DC

## 2019-05-29 MED ORDER — ALUM & MAG HYDROXIDE-SIMETH 200-200-20 MG/5ML PO SUSP: 30.0000 mL | Freq: Four times a day (QID) | ORAL | Status: DC | PRN

## 2019-05-29 MED ORDER — FENTANYL CITRATE (PF) 250 MCG/5ML IJ SOLN
INTRAMUSCULAR | Status: DC | PRN
  Administered 2019-05-29: 50 ug via INTRAVENOUS
  Administered 2019-05-29: 150 ug via INTRAVENOUS
  Administered 2019-05-29: 50 ug via INTRAVENOUS

## 2019-05-29 MED ORDER — ROCURONIUM BROMIDE 10 MG/ML (PF) SYRINGE
PREFILLED_SYRINGE | INTRAVENOUS | Status: AC
  Filled 2019-05-29: qty 10

## 2019-05-29 MED ORDER — MENTHOL 3 MG MT LOZG: 1.0000 | LOZENGE | OROMUCOSAL | Status: DC | PRN

## 2019-05-29 MED ORDER — ACETAMINOPHEN 325 MG PO TABS: 650.0000 mg | ORAL_TABLET | ORAL | Status: DC | PRN

## 2019-05-29 MED ORDER — ALBUMIN HUMAN 5 % IV SOLN
INTRAVENOUS | Status: DC | PRN
  Administered 2019-05-29 (×2): via INTRAVENOUS

## 2019-05-29 MED ORDER — ROSUVASTATIN CALCIUM 20 MG PO TABS: 40.0000 mg | ORAL_TABLET | Freq: Every day | ORAL | Status: DC

## 2019-05-29 MED ORDER — HYDROMORPHONE HCL 1 MG/ML IJ SOLN: 1.0000 mg | INTRAMUSCULAR | Status: DC | PRN

## 2019-05-29 MED ORDER — SCOPOLAMINE 1 MG/3DAYS TD PT72
MEDICATED_PATCH | TRANSDERMAL | Status: DC | PRN
  Administered 2019-05-29: 1 via TRANSDERMAL

## 2019-05-29 MED ORDER — KETOROLAC TROMETHAMINE 30 MG/ML IJ SOLN
30.0000 mg | Freq: Once | INTRAMUSCULAR | Status: AC
  Administered 2019-05-29: 30 mg via INTRAVENOUS

## 2019-05-29 MED ORDER — HYDROMORPHONE HCL 1 MG/ML IJ SOLN: 2.0000 mg | INTRAMUSCULAR | Status: DC | PRN

## 2019-05-29 MED ORDER — SODIUM CHLORIDE 0.9% FLUSH
3.0000 mL | Freq: Two times a day (BID) | INTRAVENOUS | Status: DC
  Administered 2019-05-29: 3 mL via INTRAVENOUS

## 2019-05-29 MED ORDER — LIDOCAINE 2% (20 MG/ML) 5 ML SYRINGE
INTRAMUSCULAR | Status: DC | PRN
  Administered 2019-05-29: 100 mg via INTRAVENOUS

## 2019-05-29 MED ORDER — SODIUM CHLORIDE 0.9% FLUSH: 3.0000 mL | INTRAVENOUS | Status: DC | PRN

## 2019-05-29 MED ORDER — ONDANSETRON HCL 4 MG/2ML IJ SOLN
INTRAMUSCULAR | Status: AC
  Filled 2019-05-29: qty 2

## 2019-05-29 MED ORDER — INSULIN ASPART 100 UNIT/ML ~~LOC~~ SOLN
0.0000 [IU] | Freq: Every day | SUBCUTANEOUS | Status: DC
  Administered 2019-05-29: 4 [IU] via SUBCUTANEOUS

## 2019-05-29 MED ORDER — SODIUM CHLORIDE 0.9 % IV SOLN
INTRAVENOUS | Status: DC | PRN
  Administered 2019-05-29: 500 mL

## 2019-05-29 MED ORDER — FENOFIBRATE 160 MG PO TABS: 160.0000 mg | ORAL_TABLET | Freq: Every day | ORAL | Status: DC

## 2019-05-29 MED ORDER — GABAPENTIN 400 MG PO CAPS
400.0000 mg | ORAL_CAPSULE | Freq: Three times a day (TID) | ORAL | Status: DC
  Administered 2019-05-29 (×2): 400 mg via ORAL
  Filled 2019-05-29 (×2): qty 1

## 2019-05-29 MED ORDER — PHENYLEPHRINE 40 MCG/ML (10ML) SYRINGE FOR IV PUSH (FOR BLOOD PRESSURE SUPPORT)
PREFILLED_SYRINGE | INTRAVENOUS | Status: AC
  Filled 2019-05-29: qty 10

## 2019-05-29 MED ORDER — PROPOFOL 10 MG/ML IV BOLUS
INTRAVENOUS | Status: DC | PRN
  Administered 2019-05-29: 100 mg via INTRAVENOUS

## 2019-05-29 MED ORDER — CEFAZOLIN SODIUM 1 G IJ SOLR
INTRAMUSCULAR | Status: AC
  Filled 2019-05-29: qty 20

## 2019-05-29 MED ORDER — EPHEDRINE SULFATE 50 MG/ML IJ SOLN
INTRAMUSCULAR | Status: DC | PRN
  Administered 2019-05-29: 10 mg via INTRAVENOUS

## 2019-05-29 MED ORDER — BUPIVACAINE HCL (PF) 0.5 % IJ SOLN
INTRAMUSCULAR | Status: AC
  Filled 2019-05-29: qty 30

## 2019-05-29 MED ORDER — CEFAZOLIN SODIUM-DEXTROSE 2-4 GM/100ML-% IV SOLN: 2.0000 g | INTRAVENOUS | Status: DC

## 2019-05-29 MED ORDER — INSULIN ASPART 100 UNIT/ML ~~LOC~~ SOLN
0.0000 [IU] | Freq: Three times a day (TID) | SUBCUTANEOUS | Status: DC
  Administered 2019-05-29: 3 [IU] via SUBCUTANEOUS
  Administered 2019-05-30: 2 [IU] via SUBCUTANEOUS

## 2019-05-29 MED ORDER — ONDANSETRON HCL 4 MG/2ML IJ SOLN
4.0000 mg | Freq: Once | INTRAMUSCULAR | Status: AC | PRN
  Administered 2019-05-29: 4 mg via INTRAVENOUS

## 2019-05-29 MED ORDER — FENTANYL CITRATE (PF) 250 MCG/5ML IJ SOLN
INTRAMUSCULAR | Status: AC
  Filled 2019-05-29: qty 5

## 2019-05-29 MED ORDER — ROCURONIUM BROMIDE 50 MG/5ML IV SOSY
PREFILLED_SYRINGE | INTRAVENOUS | Status: DC | PRN
  Administered 2019-05-29: 100 mg via INTRAVENOUS
  Administered 2019-05-29: 30 mg via INTRAVENOUS

## 2019-05-29 MED ORDER — SUGAMMADEX SODIUM 200 MG/2ML IV SOLN
INTRAVENOUS | Status: DC | PRN
  Administered 2019-05-29: 225 mg via INTRAVENOUS

## 2019-05-29 MED ORDER — LINAGLIPTIN 5 MG PO TABS
5.0000 mg | ORAL_TABLET | Freq: Every day | ORAL | Status: DC
  Filled 2019-05-29: qty 1

## 2019-05-29 MED ORDER — LIDOCAINE-EPINEPHRINE 1 %-1:100000 IJ SOLN
INTRAMUSCULAR | Status: DC | PRN
  Administered 2019-05-29: 20 mL

## 2019-05-29 MED ORDER — CANAGLIFLOZIN 100 MG PO TABS: 100.0000 mg | ORAL_TABLET | Freq: Every day | ORAL | Status: DC

## 2019-05-29 MED ORDER — DULOXETINE HCL 60 MG PO CPEP
60.0000 mg | ORAL_CAPSULE | Freq: Every day | ORAL | Status: DC
  Filled 2019-05-29: qty 1

## 2019-05-29 MED ORDER — DIPHENHYDRAMINE HCL 50 MG/ML IJ SOLN
INTRAMUSCULAR | Status: DC | PRN
  Administered 2019-05-29: 12.5 mg via INTRAVENOUS

## 2019-05-29 MED ORDER — LIDOCAINE 2% (20 MG/ML) 5 ML SYRINGE
INTRAMUSCULAR | Status: AC
  Filled 2019-05-29: qty 5

## 2019-05-29 MED ORDER — 0.9 % SODIUM CHLORIDE (POUR BTL) OPTIME
TOPICAL | Status: DC | PRN
  Administered 2019-05-29 (×2): 1000 mL

## 2019-05-29 MED ORDER — SODIUM CHLORIDE 0.9 % IV SOLN
INTRAVENOUS | Status: DC | PRN
  Administered 2019-05-29: 25 ug/min via INTRAVENOUS

## 2019-05-29 MED ORDER — HYDROMORPHONE HCL 1 MG/ML IJ SOLN: 0.2500 mg | INTRAMUSCULAR | Status: DC | PRN

## 2019-05-29 MED ORDER — HYDROMORPHONE HCL 2 MG PO TABS
1.0000 mg | ORAL_TABLET | ORAL | Status: DC | PRN
  Administered 2019-05-29 – 2019-05-30 (×3): 2 mg via ORAL
  Filled 2019-05-29 (×3): qty 1

## 2019-05-29 MED ORDER — LISINOPRIL-HYDROCHLOROTHIAZIDE 20-12.5 MG PO TABS: 1.0000 | ORAL_TABLET | Freq: Every day | ORAL | Status: DC

## 2019-05-29 MED ORDER — DIPHENHYDRAMINE HCL 50 MG/ML IJ SOLN
INTRAMUSCULAR | Status: AC
  Filled 2019-05-29: qty 1

## 2019-05-29 MED ORDER — PHENYLEPHRINE HCL (PRESSORS) 10 MG/ML IV SOLN
INTRAVENOUS | Status: DC | PRN
  Administered 2019-05-29: 80 ug via INTRAVENOUS
  Administered 2019-05-29: 120 ug via INTRAVENOUS
  Administered 2019-05-29: 80 ug via INTRAVENOUS
  Administered 2019-05-29: 120 ug via INTRAVENOUS

## 2019-05-29 MED ORDER — CYCLOBENZAPRINE HCL 10 MG PO TABS
ORAL_TABLET | ORAL | Status: AC
  Filled 2019-05-29: qty 1

## 2019-05-29 MED ORDER — KETOROLAC TROMETHAMINE 30 MG/ML IJ SOLN
30.0000 mg | Freq: Four times a day (QID) | INTRAMUSCULAR | Status: DC
  Administered 2019-05-29 – 2019-05-30 (×2): 30 mg via INTRAVENOUS
  Filled 2019-05-29 (×2): qty 1

## 2019-05-29 MED ORDER — ONDANSETRON HCL 4 MG/2ML IJ SOLN: 4.0000 mg | Freq: Four times a day (QID) | INTRAMUSCULAR | Status: DC | PRN

## 2019-05-29 MED ORDER — DEXAMETHASONE SODIUM PHOSPHATE 10 MG/ML IJ SOLN
INTRAMUSCULAR | Status: DC | PRN
  Administered 2019-05-29: 5 mg via INTRAVENOUS

## 2019-05-29 MED ORDER — LACTATED RINGERS IV SOLN
INTRAVENOUS | Status: DC
  Administered 2019-05-29 (×2): via INTRAVENOUS

## 2019-05-29 MED ORDER — MIDAZOLAM HCL 2 MG/2ML IJ SOLN
INTRAMUSCULAR | Status: AC
  Filled 2019-05-29: qty 2

## 2019-05-29 MED ORDER — LIDOCAINE-EPINEPHRINE 1 %-1:100000 IJ SOLN
INTRAMUSCULAR | Status: AC
  Filled 2019-05-29: qty 1

## 2019-05-29 MED ORDER — PROMETHAZINE HCL 25 MG/ML IJ SOLN
INTRAMUSCULAR | Status: DC | PRN
  Administered 2019-05-29: 6.25 mg via INTRAVENOUS

## 2019-05-29 MED ORDER — HYDROMORPHONE HCL 2 MG PO TABS
2.0000 mg | ORAL_TABLET | ORAL | Status: DC | PRN
  Administered 2019-05-29: 2 mg via ORAL
  Filled 2019-05-29: qty 1

## 2019-05-29 MED ORDER — ONDANSETRON HCL 4 MG PO TABS: 4.0000 mg | ORAL_TABLET | Freq: Four times a day (QID) | ORAL | Status: DC | PRN

## 2019-05-29 MED ORDER — CEFAZOLIN SODIUM-DEXTROSE 2-3 GM-%(50ML) IV SOLR
INTRAVENOUS | Status: DC | PRN
  Administered 2019-05-29: 2 g via INTRAVENOUS

## 2019-05-29 MED ORDER — MEPERIDINE HCL 25 MG/ML IJ SOLN: 6.2500 mg | INTRAMUSCULAR | Status: DC | PRN

## 2019-05-29 MED ORDER — DEXAMETHASONE SODIUM PHOSPHATE 10 MG/ML IJ SOLN
INTRAMUSCULAR | Status: AC
  Filled 2019-05-29: qty 1

## 2019-05-29 MED ORDER — POTASSIUM CHLORIDE IN NACL 20-0.9 MEQ/L-% IV SOLN: INTRAVENOUS | Status: DC

## 2019-05-29 MED ORDER — INSULIN DEGLUDEC 200 UNIT/ML ~~LOC~~ SOPN: 150.0000 [IU] | PEN_INJECTOR | SUBCUTANEOUS | Status: DC

## 2019-05-29 MED ORDER — KETOROLAC TROMETHAMINE 30 MG/ML IJ SOLN
INTRAMUSCULAR | Status: AC
  Administered 2019-05-29: 30 mg
  Filled 2019-05-29: qty 1

## 2019-05-29 MED ORDER — BUPIVACAINE HCL (PF) 0.5 % IJ SOLN
INTRAMUSCULAR | Status: DC | PRN
  Administered 2019-05-29: 20 mL

## 2019-05-29 MED ORDER — HYDROCHLOROTHIAZIDE 12.5 MG PO CAPS: 12.5000 mg | ORAL_CAPSULE | Freq: Every day | ORAL | Status: DC

## 2019-05-29 MED ORDER — EPHEDRINE 5 MG/ML INJ
INTRAVENOUS | Status: AC
  Filled 2019-05-29: qty 10

## 2019-05-29 MED ORDER — THROMBIN 5000 UNITS EX SOLR
CUTANEOUS | Status: AC
  Filled 2019-05-29: qty 5000

## 2019-05-29 MED ORDER — ACETAMINOPHEN 10 MG/ML IV SOLN
INTRAVENOUS | Status: DC | PRN
  Administered 2019-05-29: 1000 mg via INTRAVENOUS

## 2019-05-29 MED ORDER — HYDROXYZINE HCL 25 MG PO TABS: 50.0000 mg | ORAL_TABLET | ORAL | Status: DC | PRN

## 2019-05-29 MED ORDER — HYDROXYZINE HCL 50 MG/ML IM SOLN: 50.0000 mg | INTRAMUSCULAR | Status: DC | PRN

## 2019-05-29 MED ORDER — THROMBIN 20000 UNITS EX SOLR
CUTANEOUS | Status: AC
  Filled 2019-05-29: qty 20000

## 2019-05-29 MED ORDER — SCOPOLAMINE 1 MG/3DAYS TD PT72
MEDICATED_PATCH | TRANSDERMAL | Status: AC
  Filled 2019-05-29: qty 1

## 2019-05-29 MED ORDER — CYCLOBENZAPRINE HCL 5 MG PO TABS
5.0000 mg | ORAL_TABLET | Freq: Three times a day (TID) | ORAL | Status: DC | PRN
  Administered 2019-05-29: 10 mg via ORAL
  Administered 2019-05-30: 5 mg via ORAL
  Filled 2019-05-29: qty 2

## 2019-05-29 MED ORDER — LISINOPRIL 20 MG PO TABS: 20.0000 mg | ORAL_TABLET | Freq: Every day | ORAL | Status: DC

## 2019-05-29 MED ORDER — THROMBIN 5000 UNITS EX SOLR
OROMUCOSAL | Status: DC | PRN
  Administered 2019-05-29: 5 mL via TOPICAL

## 2019-05-29 SURGICAL SUPPLY — 79 items
BAG DECANTER FOR FLEXI CONT (MISCELLANEOUS) ×2 IMPLANT
BENZOIN TINCTURE PRP APPL 2/3 (GAUZE/BANDAGES/DRESSINGS) ×2 IMPLANT
BLADE CLIPPER SURG (BLADE) IMPLANT
BUR ACRON 5.0MM COATED (BURR) ×2 IMPLANT
BUR MATCHSTICK NEURO 3.0 LAGG (BURR) ×2 IMPLANT
CAGE POST LUM 11X23X9 6D (Cage) ×2 IMPLANT
CANISTER SUCT 3000ML PPV (MISCELLANEOUS) ×2 IMPLANT
CAP LCK SPNE (Orthopedic Implant) ×4 IMPLANT
CAP LOCK SPINE RADIUS (Orthopedic Implant) IMPLANT
CAP LOCKING (Orthopedic Implant) ×4 IMPLANT
CARTRIDGE OIL MAESTRO DRILL (MISCELLANEOUS) ×1 IMPLANT
CONT SPEC 4OZ CLIKSEAL STRL BL (MISCELLANEOUS) ×3 IMPLANT
COVER BACK TABLE 60X90IN (DRAPES) ×2 IMPLANT
COVER WAND RF STERILE (DRAPES) ×2 IMPLANT
DECANTER SPIKE VIAL GLASS SM (MISCELLANEOUS) ×2 IMPLANT
DERMABOND ADVANCED (GAUZE/BANDAGES/DRESSINGS) ×2
DERMABOND ADVANCED .7 DNX12 (GAUZE/BANDAGES/DRESSINGS) ×1 IMPLANT
DIFFUSER DRILL AIR PNEUMATIC (MISCELLANEOUS) ×2 IMPLANT
DRAPE C-ARM 42X72 X-RAY (DRAPES) ×4 IMPLANT
DRAPE C-ARMOR (DRAPES) ×1 IMPLANT
DRAPE HALF SHEET 40X57 (DRAPES) IMPLANT
DRAPE LAPAROTOMY 100X72X124 (DRAPES) ×2 IMPLANT
DRAPE POUCH INSTRU U-SHP 10X18 (DRAPES) ×2 IMPLANT
ELECT BLADE 4.0 EZ CLEAN MEGAD (MISCELLANEOUS) ×2
ELECT REM PT RETURN 9FT ADLT (ELECTROSURGICAL) ×2
ELECTRODE BLDE 4.0 EZ CLN MEGD (MISCELLANEOUS) IMPLANT
ELECTRODE REM PT RTRN 9FT ADLT (ELECTROSURGICAL) ×1 IMPLANT
GAUZE 4X4 16PLY RFD (DISPOSABLE) IMPLANT
GAUZE SPONGE 4X4 12PLY STRL (GAUZE/BANDAGES/DRESSINGS) ×2 IMPLANT
GAUZE SPONGE 4X4 12PLY STRL LF (GAUZE/BANDAGES/DRESSINGS) ×1 IMPLANT
GLOVE BIO SURGEON STRL SZ8 (GLOVE) ×1 IMPLANT
GLOVE BIO SURGEON STRL SZ8.5 (GLOVE) ×1 IMPLANT
GLOVE BIOGEL PI IND STRL 6.5 (GLOVE) IMPLANT
GLOVE BIOGEL PI IND STRL 7.0 (GLOVE) IMPLANT
GLOVE BIOGEL PI IND STRL 8 (GLOVE) ×2 IMPLANT
GLOVE BIOGEL PI INDICATOR 6.5 (GLOVE) ×1
GLOVE BIOGEL PI INDICATOR 7.0 (GLOVE) ×2
GLOVE BIOGEL PI INDICATOR 8 (GLOVE) ×4
GLOVE ECLIPSE 7.5 STRL STRAW (GLOVE) ×8 IMPLANT
GLOVE SURG SS PI 6.0 STRL IVOR (GLOVE) ×2 IMPLANT
GOWN STRL REUS W/ TWL LRG LVL3 (GOWN DISPOSABLE) IMPLANT
GOWN STRL REUS W/ TWL XL LVL3 (GOWN DISPOSABLE) ×2 IMPLANT
GOWN STRL REUS W/TWL 2XL LVL3 (GOWN DISPOSABLE) ×2 IMPLANT
GOWN STRL REUS W/TWL LRG LVL3 (GOWN DISPOSABLE) ×1
GOWN STRL REUS W/TWL XL LVL3 (GOWN DISPOSABLE) ×3
HEMOSTAT POWDER KIT SURGIFOAM (HEMOSTASIS) ×1 IMPLANT
KIT BASIN OR (CUSTOM PROCEDURE TRAY) ×2 IMPLANT
KIT INFUSE X SMALL 1.4CC (Orthopedic Implant) ×1 IMPLANT
KIT TURNOVER KIT B (KITS) ×2 IMPLANT
NDL ASP BONE MRW 8GX15 (NEEDLE) IMPLANT
NDL SPNL 18GX3.5 QUINCKE PK (NEEDLE) ×1 IMPLANT
NDL SPNL 22GX3.5 QUINCKE BK (NEEDLE) ×1 IMPLANT
NEEDLE ASP BONE MRW 8GX15 (NEEDLE) ×2 IMPLANT
NEEDLE SPNL 18GX3.5 QUINCKE PK (NEEDLE) ×2 IMPLANT
NEEDLE SPNL 22GX3.5 QUINCKE BK (NEEDLE) ×4 IMPLANT
NS IRRIG 1000ML POUR BTL (IV SOLUTION) ×3 IMPLANT
OIL CARTRIDGE MAESTRO DRILL (MISCELLANEOUS) ×2
PACK LAMINECTOMY NEURO (CUSTOM PROCEDURE TRAY) ×2 IMPLANT
PAD ARMBOARD 7.5X6 YLW CONV (MISCELLANEOUS) ×6 IMPLANT
PATTIES SURGICAL .5 X.5 (GAUZE/BANDAGES/DRESSINGS) IMPLANT
PATTIES SURGICAL .5 X1 (DISPOSABLE) ×1 IMPLANT
PATTIES SURGICAL 1X1 (DISPOSABLE) IMPLANT
ROD RADIUS 35MM (Rod) ×2 IMPLANT
SCREW 5.75X45MM (Screw) ×2 IMPLANT
SCREW 6.75X35MM (Screw) ×2 IMPLANT
SPONGE LAP 4X18 RFD (DISPOSABLE) ×1 IMPLANT
SPONGE NEURO XRAY DETECT 1X3 (DISPOSABLE) IMPLANT
SPONGE SURGIFOAM ABS GEL 100 (HEMOSTASIS) ×2 IMPLANT
STRIP BIOACTIVE VITOSS 25X100X (Neuro Prosthesis/Implant) ×2 IMPLANT
SUT VIC AB 1 CT1 18XBRD ANBCTR (SUTURE) ×2 IMPLANT
SUT VIC AB 1 CT1 8-18 (SUTURE) ×2
SUT VIC AB 2-0 CP2 18 (SUTURE) ×4 IMPLANT
SYR 3ML LL SCALE MARK (SYRINGE) IMPLANT
SYR CONTROL 10ML LL (SYRINGE) ×2 IMPLANT
TAPE CLOTH SURG 4X10 WHT LF (GAUZE/BANDAGES/DRESSINGS) ×1 IMPLANT
TOWEL GREEN STERILE (TOWEL DISPOSABLE) ×2 IMPLANT
TOWEL GREEN STERILE FF (TOWEL DISPOSABLE) ×2 IMPLANT
TRAY FOLEY MTR SLVR 16FR STAT (SET/KITS/TRAYS/PACK) ×2 IMPLANT
WATER STERILE IRR 1000ML POUR (IV SOLUTION) ×2 IMPLANT

## 2019-05-29 NOTE — Transfer of Care (Signed)
Immediate Anesthesia Transfer of Care Note  Patient: Tamara Solomon  Procedure(s) Performed: LUMBAR FIVE- SACRAL ONE LUMBAR DECOMPRESSION,POSTERIOR LUMBAR INTERBODY FUSION, POSTERIOR LATERAL ARTHRODESIS (N/A Back)  Patient Location: PACU  Anesthesia Type:General  Level of Consciousness: drowsy and patient cooperative  Airway & Oxygen Therapy: Patient Spontanous Breathing and Patient connected to face mask oxygen  Post-op Assessment: Report given to RN and Post -op Vital signs reviewed and stable  Post vital signs: Reviewed and stable  Last Vitals:  Vitals Value Taken Time  BP 183/99 05/29/19 1504  Temp    Pulse 109 05/29/19 1505  Resp 23 05/29/19 1505  SpO2 99 % 05/29/19 1505  Vitals shown include unvalidated device data.  Last Pain:  Vitals:   05/29/19 0915  TempSrc:   PainSc: 0-No pain         Complications: No apparent anesthesia complications

## 2019-05-29 NOTE — H&P (Signed)
Subjective: Patient is a 48 y.o. right-handed female who is admitted for treatment of multifactorial lumbar stenosis with neurogenic claudication.    Patient has been having difficulties for the past 7 years or more.  She has worsening low back pain and in particular, right lumbar radicular pain.  She has been treated with anti-inflammatory medications, muscle relaxants, gabapentin, Cymbalta, numerous spinal injections, and physical therapy.   MRI scan shows progressively worsening advanced degenerative disc disease and spondylosis at the L5-S1 level, with worsening bilateral L5-S1 neural foraminal stenosis, worse on the right than the left.  Because of the lack of response to extensive treatment the patient is brought to surgery for decompression and stabilization specifically an L5-S1 lumbar laminectomy, facetectomy, and foraminotomies bilaterally, with bilateral L5-S1 posterior lumbar interbody arthrodesis with interbody implants and bone graft, and bilateral L5-S1 posterolateral arthrodesis with posterior instrumentation and bone graft.  Patient Active Problem List   Diagnosis Date Noted  . Liver masses 11/14/2012  . TRANSAMINASES, SERUM, ELEVATED 07/24/2009  . EPIGASTRIC PAIN 06/13/2009  . Diarrhea 05/22/2009  . MELENA 04/10/2009  . BURN, FIRST DEGREE, ABDOMINAL WALL 04/10/2009  . DIABETES MELLITUS, TYPE II, UNCONTROLLED 08/23/2007  . HYPERLIPIDEMIA 11/10/2006  . MORBID OBESITY 11/10/2006  . ANXIETY 11/10/2006  . DEPRESSION 11/10/2006  . MIGRAINE HEADACHE 11/10/2006  . CARPAL TUNNEL SYNDROME 11/10/2006  . HYPERTENSION 11/10/2006  . ALLERGIC RHINITIS 11/10/2006  . GERD 11/10/2006  . IBS 11/10/2006   Past Medical History:  Diagnosis Date  . Anxiety   . Arthritis    lumbar stenosis  . Complication of anesthesia   . Diabetes mellitus   . Family history of adverse reaction to anesthesia    family history of N&V  . GERD (gastroesophageal reflux disease)   . Hyperlipidemia   .  Hypertension   . Irritable bowel syndrome   . PONV (postoperative nausea and vomiting)     Past Surgical History:  Procedure Laterality Date  . CARPAL TUNNEL RELEASE     Patient has had three times - 2 times right hand, one time to the left  . CERVICAL CONE BIOPSY  2009  . CHOLECYSTECTOMY  1997  . COLONOSCOPY    . DILATION AND CURETTAGE OF UTERUS  2009  . HERNIA REPAIR  2004  . LIVER SURGERY  2000   Liver resection  . NM MYOCAR PERF WALL MOTION  02/16/2012   low risk study.  Small area of breast attenuation  . TONSILLECTOMY  1978  . TRIGGER FINGER RELEASE Bilateral    several times  . UPPER GASTROINTESTINAL ENDOSCOPY    . US ECHOCARDIOGRAPHY  02/16/2012   mild concentric LVH, EF >55%    Medications Prior to Admission  Medication Sig Dispense Refill Last Dose  . acetaminophen (TYLENOL) 500 MG tablet Take 1,000 mg by mouth every 6 (six) hours as needed (pain).   05/28/2019 at Unknown time  . APPLE CIDER VINEGAR PO Take 450 mg by mouth daily.   Past Week at Unknown time  . Choline Fenofibrate (TRILIPIX) 135 MG capsule Take 135 mg by mouth daily.   05/28/2019 at Unknown time  . diclofenac sodium (VOLTAREN) 1 % GEL Apply 1 application topically daily as needed (back pain).   Past Week at Unknown time  . DULoxetine (CYMBALTA) 60 MG capsule Take 60 mg by mouth daily.   05/29/2019 at Unknown time  . empagliflozin (JARDIANCE) 25 MG TABS tablet Take 25 mg by mouth daily.   05/28/2019 at Unknown time  . gabapentin (NEURONTIN)  400 MG capsule Take 400 mg by mouth 3 (three) times daily.   05/29/2019 at Unknown time  . insulin aspart (NOVOLOG FLEXPEN) 100 UNIT/ML FlexPen Inject 50 Units into the skin 3 (three) times daily with meals.   05/28/2019 at Unknown time  . Insulin Degludec (TRESIBA FLEXTOUCH) 200 UNIT/ML SOPN Inject 150 Units into the skin every morning.   05/29/2019 at Unknown time  . Levonorgestrel-Ethinyl Estrad (PORTIA-28 PO) Take 1 tablet by mouth daily.    05/28/2019 at Unknown time  .  lisinopril-hydrochlorothiazide (PRINZIDE,ZESTORETIC) 20-12.5 MG per tablet Take 1 tablet by mouth daily.   05/28/2019 at Unknown time  . omeprazole (PRILOSEC) 20 MG capsule Take 20 mg by mouth 2 (two) times daily before a meal.   05/29/2019 at Unknown time  . rosuvastatin (CRESTOR) 40 MG tablet Take 40 mg by mouth daily.    05/29/2019 at Unknown time  . sitaGLIPtin (JANUVIA) 100 MG tablet Take 100 mg by mouth daily.   05/28/2019 at Unknown time  . Turmeric 500 MG CAPS Take 500 mg by mouth daily.   Past Week at Unknown time  . ACCU-CHEK AVIVA PLUS test strip       Allergies  Allergen Reactions  . Topiramate Other (See Comments)    REACTION: Angioedema  . Codeine Rash    REACTION: Rash and vomitting  . Metformin Nausea And Vomiting    REACTION: Nausea and vomitting  . Percocet [Oxycodone-Acetaminophen] Nausea And Vomiting  . Tramadol Nausea And Vomiting    Social History   Tobacco Use  . Smoking status: Never Smoker  . Smokeless tobacco: Never Used  Substance Use Topics  . Alcohol use: No    Family History  Problem Relation Age of Onset  . Healthy Mother   . Liver cancer Father   . Healthy Son   . Diabetes Brother      Review of Systems Pertinent items noted in HPI and remainder of comprehensive ROS otherwise negative.  Objective: Vital signs in last 24 hours: Temp:  [98.7 F (37.1 C)] 98.7 F (37.1 C) (06/22 0906) Pulse Rate:  [91] 91 (06/22 0906) Resp:  [20] 20 (06/22 0906) BP: (160)/(86) 160/86 (06/22 0906) SpO2:  [99 %] 99 % (06/22 0906) Weight:  [112.5 kg] 112.5 kg (06/22 0906)  EXAM:   Patient is a morbidly obese white female, in no acute distress.   Lungs are clear to auscultation , the patient has symmetrical respiratory excursion. Heart has a regular rate and rhythm normal S1 and S2 no murmur.   Abdomen is soft nontender nondistended bowel sounds are present. Extremity examination shows no clubbing cyanosis or edema. Motor examination shows 5 over 5 strength in the  lower extremities including the iliopsoas quadriceps dorsiflexor extensor hallicus  longus and plantar flexor bilaterally. Sensation is intact to pinprick in the distal lower extremities. Reflexes are symmetrical bilaterally. No pathologic reflexes are present. Patient has a normal gait and stance.  Data Review:CBC    Component Value Date/Time   WBC 7.1 05/25/2019 1022   RBC 5.00 05/25/2019 1022   HGB 15.0 05/25/2019 1022   HCT 45.8 05/25/2019 1022   PLT 292 05/25/2019 1022   MCV 91.6 05/25/2019 1022   MCH 30.0 05/25/2019 1022   MCHC 32.8 05/25/2019 1022   RDW 12.2 05/25/2019 1022   LYMPHSABS 3.5 10/22/2010 1742   MONOABS 0.7 10/22/2010 1742   EOSABS 0.3 10/22/2010 1742   BASOSABS 0.1 10/22/2010 1742  BMET    Component Value Date/Time   NA 136 05/25/2019 1022   K 4.0 05/25/2019 1022   CL 103 05/25/2019 1022   CO2 24 05/25/2019 1022   GLUCOSE 298 (H) 05/25/2019 1022   BUN 8 05/25/2019 1022   CREATININE 0.69 05/25/2019 1022   CREATININE 0.87 10/06/2012 1510   CALCIUM 9.7 05/25/2019 1022   GFRNONAA >60 05/25/2019 1022   GFRAA >60 05/25/2019 1022     Assessment/Plan: Patient with low back and lumbar radicular pain consistent with neurogenic claudication, with advanced degeneration and stenosis at the L5-S1 level, who is admitted now for decompression and stabilization.  I've discussed with the patient the nature of his condition, the nature the surgical procedure, the typical length of surgery, hospital stay, and overall recuperation, the limitations postoperatively, and risks of surgery. I discussed risks including risks of infection, bleeding, possibly need for transfusion, the risk of nerve root dysfunction with pain, weakness, numbness, or paresthesias, the risk of dural tear and CSF leakage and possible need for further surgery, the risk of failure of the arthrodesis and possibly for further surgery, the risk of anesthetic complications including  myocardial infarction, stroke, pneumonia, and death. We discussed the need for postoperative immobilization in a lumbar brace. Understanding all this the patient does wish to proceed with surgery and is admitted for such.   Hosie Spangle, MD 05/29/2019 10:03 AM

## 2019-05-29 NOTE — Op Note (Signed)
05/29/2019  2:50 PM  PATIENT:  Tamara Solomon  48 y.o. female  PRE-OPERATIVE DIAGNOSIS: Multifactorial L5-S1 lumbar stenosis with neurogenic claudication, associated with bilateral L5-S1 neuroforaminal stenosis, right worse than left; lumbar spondylosis; lumbar degenerative disc disease; right lumbar radiculopathy  POST-OPERATIVE DIAGNOSIS:  Multifactorial L5-S1 lumbar stenosis with neurogenic claudication, associated with bilateral L5-S1 neuroforaminal stenosis, right worse than left; lumbar spondylosis; lumbar degenerative disc disease; right lumbar radiculopathy  PROCEDURE:  Procedure(s): Bilateral L5-S1 lumbar decompression and stabilization including bilateral L5-S1 lumbar laminotomy, facetectomy, and foraminotomies, for decompression of the stenotic compression of the exiting L5 and S1 nerve roots bilaterally, with decompression beyond that required for interbody arthrodesis, bilateral L5-S1 posterior lumbar interbody arthrodesis with Tritanium interbody implants, Vitoss BA with bone marrow aspirate, and infuse, and bilateral L5-S1 posterior lateral arthrodesis with nonsegmental radius posterior instrumentation, locally harvested morselized autograft, Vitoss BA with bone marrow aspirate, and infuse  SURGEON: Jovita Gamma, MD  ASSISTANTS: Newman Pies, MD  ANESTHESIA:   general  EBL:  Total I/O In: 2000 [I.V.:1500; IV Piggyback:500] Out: 500 [Urine:200; Blood:300]  BLOOD ADMINISTERED:none  CELL SAVER GIVEN: Cell Saver technician felt that there was insufficient collected blood to process.  COUNT: Correct per nursing staff  DICTATION: Patient is brought to the operating room placed under general endotracheal anesthesia. The patient was turned to prone position the lumbar region was prepped with Betadine soap and solution and draped in a sterile fashion. The midline was infiltrated with local anesthesia with epinephrine. A localizing x-ray was taken and then a midline incision  was made carried down through the subcutaneous tissue, bipolar cautery and electrocautery were used to maintain hemostasis. Dissection was carried down to the lumbar fascia. The fascia was incised bilaterally and the paraspinal muscles were dissected with a spinous process and lamina in a subperiosteal fashion.  Additional x-rays were taken for localization and the L5-S1 level was localized. Dissection was then carried out laterally over the facet complex and the transverse processes of L5 and the ala of S1 were exposed and decorticated.   We then proceeded with the decompression.  Bilateral L5 and S1 laminotomies and facetectomies were performed using the high-speed drill and Kerrison punches. Dissection was carried out laterally and foraminotomies for decompression of the stenotic compression of the exiting L5 and S1 nerve roots bilaterally.  We found particularly severe stenosis in the right L5-S1 neuroforamen, with spondylitic disc herniation and ridging directly compressing the exiting right L5 nerve root.  This was carefully separated from the nerve root and removed, decompressing the exiting nerve.  Once the decompression of the stenotic compression of the thecal sac and exiting nerve roots was completed we proceeded with the posterior lumbar interbody arthrodesis. The annulus was incised bilaterally and the disc space entered. A thorough discectomy was performed using pituitary rongeurs and curettes. Once the discectomy was completed we began to prepare the endplate surfaces removing the cartilaginous endplates surface. We then measured the height of the intervertebral disc space. We selected 10 x 23 x 6 x 9 Tritanium interbody implants.  The C-arm fluoroscope was then draped and brought in the field and we identified the pedicle entry points bilaterally at the L5 and S1 levels. Each of the 4 pedicles was probed, we aspirated bone marrow aspirate from the vertebral bodies, this was injected over two 10  cc strips of Vitoss BA. Then each of the pedicles was examined with the ball probe good bony surfaces were found.  We were able to place bicortical screws  at S1.  Each of the pedicles was then tapped with a 5.25 mm tap, again examined with the ball probe good threading was found and no bony cuts were found. We then placed 5.75 x 45 mm screws bilaterally at the L5 level and 6.75 x 35 mm screws bilaterally at the S1 level.  We then packed the interbody implants with Vitoss BA with bone marrow aspirate and infuse, and then placed the first implant and on the right side, carefully retracting the thecal sac and nerve root medially. We then went back to the left side and packed the midline with additional Vitoss BA with bone marrow aspirate and infuse, and then placed a second implant and on the left side again retracting the thecal sac and nerve root medially. Additional Vitoss BA with bone marrow aspirate and infuse was packed lateral to the implants.  We then packed the lateral gutter over the transverse processes and intertransverse space with locally harvested morselized autograft, Vitoss BA with bone marrow aspirate, and infuse. We then selected 35 mm pre-lordosed rods, they were placed within the screw heads and secured with locking caps once all 4 locking caps were placed final tightening was performed against a counter torque.  The wound had been irrigated multiple times during the procedure with saline solution and bacitracin solution, good hemostasis was established with a combination of bipolar cautery and Gelfoam with thrombin. Once good hemostasis was confirmed we proceeded with closure paraspinal muscles deep fascia and Scarpa's fascia were closed with interrupted undyed 1 Vicryl sutures the subcutaneous and subcuticular closed with interrupted inverted 2-0 undyed Vicryl sutures the skin edges were approximated with Dermabond.  The wound was dressed with sterile gauze and Hypafix.  Following surgery  the patient was turned back to the supine position to be reversed and the anesthetic extubated and transferred to the recovery room for further care.  PLAN OF CARE: Admit to inpatient   PATIENT DISPOSITION:  PACU - hemodynamically stable.   Delay start of Pharmacological VTE agent (>24hrs) due to surgical blood loss or risk of bleeding:  Yes`

## 2019-05-29 NOTE — Progress Notes (Signed)
Vitals:   05/29/19 1604 05/29/19 1619 05/29/19 1634 05/29/19 1705  BP: 121/66 130/71  134/66  Pulse: 89 93  94  Resp: 18 18 20  (!) 22  Temp:    (!) 97.4 F (36.3 C)  TempSrc:    Oral  SpO2: 96% 99%  99%  Weight:      Height:        She is sitting up, eating dinner.  Has walked the length of the hall, and her Foley has been discontinued.  She has voided a small amount already.  Her dressing is clean and dry.  She is had good relief of her preoperative right lumbar radicular pain, and has had only mild incisional discomfort.  Plan: Encouraged to ambulate in the halls.  Continue to progress through postoperative recovery.  Hosie Spangle, MD 05/29/2019, 7:17 PM

## 2019-05-29 NOTE — Anesthesia Procedure Notes (Signed)
Procedure Name: Intubation Date/Time: 05/29/2019 11:00 AM Performed by: Kathryne Hitch, CRNA Pre-anesthesia Checklist: Patient identified, Emergency Drugs available, Suction available and Patient being monitored Patient Re-evaluated:Patient Re-evaluated prior to induction Oxygen Delivery Method: Circle system utilized Preoxygenation: Pre-oxygenation with 100% oxygen Induction Type: IV induction Ventilation: Mask ventilation without difficulty Laryngoscope Size: Miller and 2 Grade View: Grade I Tube type: Oral Tube size: 7.0 mm Number of attempts: 1 Airway Equipment and Method: Stylet and Oral airway Placement Confirmation: ETT inserted through vocal cords under direct vision,  positive ETCO2 and breath sounds checked- equal and bilateral Secured at: 20 cm Tube secured with: Tape Dental Injury: Teeth and Oropharynx as per pre-operative assessment

## 2019-05-29 NOTE — Anesthesia Postprocedure Evaluation (Signed)
Anesthesia Post Note  Patient: Tamara Solomon  Procedure(s) Performed: LUMBAR FIVE- SACRAL ONE LUMBAR DECOMPRESSION,POSTERIOR LUMBAR INTERBODY FUSION, POSTERIOR LATERAL ARTHRODESIS (N/A Back)     Patient location during evaluation: PACU Anesthesia Type: General Level of consciousness: awake and alert Pain management: pain level controlled Vital Signs Assessment: post-procedure vital signs reviewed and stable Respiratory status: spontaneous breathing, nonlabored ventilation, respiratory function stable and patient connected to nasal cannula oxygen Cardiovascular status: blood pressure returned to baseline and stable Postop Assessment: no apparent nausea or vomiting Anesthetic complications: no    Last Vitals:  Vitals:   05/29/19 1504 05/29/19 1534  BP: (!) 183/99 120/85  Pulse: (!) 111 93  Resp: 20 17  Temp: 36.6 C   SpO2: 98% (!) 88%    Last Pain:  Vitals:   05/29/19 1534  TempSrc:   PainSc: Asleep                 Novah Goza S

## 2019-05-30 LAB — GLUCOSE, CAPILLARY: Glucose-Capillary: 149 mg/dL — ABNORMAL HIGH (ref 70–99)

## 2019-05-30 MED ORDER — CYCLOBENZAPRINE HCL 10 MG PO TABS: 5.0000 mg | ORAL_TABLET | Freq: Three times a day (TID) | ORAL | 0 refills | Status: DC | PRN

## 2019-05-30 MED ORDER — CYCLOBENZAPRINE HCL 10 MG PO TABS: 10.0000 mg | ORAL_TABLET | Freq: Three times a day (TID) | ORAL | Status: DC | PRN

## 2019-05-30 MED ORDER — HYDROMORPHONE HCL 2 MG PO TABS: 2.0000 mg | ORAL_TABLET | ORAL | 0 refills | Status: DC | PRN

## 2019-05-30 NOTE — Discharge Instructions (Signed)
Wound Care Leave incision open to air. You may shower. Do not scrub directly on incision.  Do not put any creams, lotions, or ointments on incision. Activity Walk each and every day, increasing distance each day. No lifting greater than 5 lbs.  Avoid bending, arching, and twisting. No driving for 2 weeks; may ride as a passenger locally. If provided with back brace, wear when out of bed.  It is not necessary to wear in bed. Diet Resume your normal diet.  Return to Work Will be discussed at you follow up appointment. Call Your Doctor If Any of These Occur Redness, drainage, or swelling at the wound.  Temperature greater than 101 degrees. Severe pain not relieved by pain medication. Incision starts to come apart. Follow Up Appt Call today for appointment in 3 weeks ([phone]) or for problems.  If you have any hardware placed in your spine, you will need an x-ray before your appointment.   Spinal Fusion, Adult, Care After This sheet gives you information about how to care for yourself after your procedure. Your doctor may also give you more specific instructions. If you have problems or questions, contact your doctor. Follow these instructions at home: Medicines  Take over-the-counter and prescription medicines only as told by your doctor. These include any medicines for pain or blood-thinning medicines (anticoagulants).  If you were prescribed an antibiotic medicine, take it as told by your doctor. Do not stop taking the antibiotic even if you start to feel better.  Do not drive for 24 hours if you were given a medicine to help you relax (sedative) during your procedure.  Do not drive or use heavy machinery while taking prescription pain medicine. If you have a brace:  Wear the brace as told by your doctor. Take it off only as told by your doctor.  Keep the brace clean. Managing pain, stiffness, and swelling  If directed, put ice on the surgery area: ? If you have a removable  brace, take it off as told by your doctor. ? Put ice in a plastic bag. ? Place a towel between your skin and the bag. ? Leave the ice on for 20 minutes, 2-3 times a day. Surgery cut care   Follow instructions from your doctor about how to take care of your cut from surgery (incision). Make sure you: ? Wash your hands with soap and water before you change your bandage (dressing). If you cannot use soap and water, use hand sanitizer. ? Change your bandage as told by your doctor. ? Leave stitches (sutures), skin glue, or skin tape (adhesive) strips in place. They may need to stay in place for 2 weeks or longer. If tape strips get loose and curl up, you may trim the loose edges. Do not remove tape strips completely unless your doctor says it is okay.  Keep your cut from surgery clean and dry. ? Do not take baths, swim, or use a hot tub until your doctor says it is okay. ? Ask your doctor if you can take showers. You may only be allowed to take sponge baths.  Every day, check your cut from surgery and the area around it for: ? More redness, swelling, or pain. ? Fluid or blood. ? Warmth. ? Pus or a bad smell.  If you have a drain tube, follow instructions from your doctor about caring for it. Do not take out the drain tube or any bandages unless your doctor says it is okay. Physical activity  Rest  and protect your back as much as possible.  Follow instructions from your doctor about how to move. Use good posture to help your spine heal.  Do not lift anything that is heavier than 8 lb (3.6 kg), or the limit that you are told, until your doctor says that it is safe.  Do not twist or bend at the waist until your doctor says it is okay.  It is best if you: ? Do not make pushing and pulling motions. ? Do not sit or lie down in the same position for a long time. ? Do not raise your hands or arms above your head.  Return to your normal activities as told by your doctor. Ask your doctor what  activities are safe for you. Rest and protect your back as much as you can.  Do not start to exercise until your doctor says it is okay. Ask your doctor what kinds of exercise you can do to make your back stronger. General instructions  To prevent blood clots and lessen swelling in your legs: ? Wear compression stockings as told. ? Walk one or more times every few hours as told by your doctor.  Do not use any products that contain nicotine or tobacco, such as cigarettes and e-cigarettes. These can delay bone healing. If you need help quitting, ask your doctor.  To prevent or treat constipation while you are taking prescription pain medicine, your doctor may suggest that you: ? Drink enough fluid to keep your pee (urine) pale yellow. ? Take over-the-counter or prescription medicines. ? Eat foods that are high in fiber. These include fresh fruits and vegetables, whole grains, and beans. ? Limit foods that are high in fat and processed sugars, such as fried and sweet foods.  Keep all follow-up visits as told by your doctor. This is important. Contact a doctor if:  Your pain gets worse.  Your medicine does not help your pain.  Your legs or feet get painful or swollen.  Your cut from surgery is more red, swollen, or painful.  Your cut from surgery feels warm to the touch.  You have: ? Fluid or blood coming from your cut from surgery. ? Pus or a bad smell coming from your cut from surgery. ? A fever. ? Weakness or loss of feeling (numbness) in your legs that is new or getting worse. ? Trouble controlling when you pee (urinate) or poop (have a bowel movement).  You feel sick to your stomach (nauseous).  You throw up (vomit). Get help right away if:  Your pain is very bad.  You have chest pain.  You have trouble breathing.  You start to have a cough. These symptoms may be an emergency. Do not wait to see if the symptoms will go away. Get medical help right away. Call your  local emergency services (911 in the [place] Do not drive yourself to the hospital. Summary  After the procedure, it is common to have pain in your back and pain by your surgery cut(s).  Icing and pain medicines may help to control the pain. Follow directions from your doctor.  Rest and protect your back as much as possible. Do not twist or bend at the waist.  Get up and walk one or more times every few hours as told by your doctor. This information is not intended to replace advice given to you by your health care provider. Make sure you discuss any questions you have with your health care provider. Document  Released: 03/19/2011 Document Revised: 03/09/2017 Document Reviewed: 03/09/2017 Elsevier Interactive Patient Education  2019 [employer].

## 2019-05-30 NOTE — Plan of Care (Signed)
Pt doing well. Pt given D/C instructions with verbal understanding. Rx was given to Pt prior to D/C. Pt's incision is clean and dry with no sign of infection. Pt's IV was removed prior to D/C. Pt given Rx for DME 3-n-1 for home use, Pt prefers to get equipment from outside agency. Pt D/C'd home via wheelchair per MD order. Pt is stable @ D/C and has no other needs at this time. Holli Humbles, RN

## 2019-05-30 NOTE — Discharge Summary (Signed)
Physician Discharge Summary  Patient ID: Tamara Solomon MRN: [id number] DOB/AGE: December 28, 1970 48 y.o.  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  Postoperatively she has had excellent relief of her right lumbar radicular pain.  She is up and ambulating actively.  Her dressing was removed, and the incision is healing nicely.  There is no erythema, ecchymosis, swelling, or drainage.  She is up and ambulating actively.  She does have some incisional discomfort and has been using Dilaudid and Flexeril as needed.  Her blood sugars have been less than 200, except once yesterday evening.  We are discharging her to home with instructions regarding wound care and activities.  She is scheduled for follow-up with me in the office in 3 weeks.  She did request a 3 and 1 which is been ordered.  Discharge Exam: Blood pressure 131/79, pulse 77, temperature 98.1 F (36.7 C), temperature source Oral, resp. rate 20, height 5\' 3"  (1.6 m), weight 112.5 kg, last menstrual period 05/11/2019, SpO2 100 %.  Disposition: Discharge disposition: 01-Home or Self Care       Discharge Instructions    Discharge wound care:   Complete by: As directed    Leave the wound open to air. Shower daily with the wound uncovered. Water and  soapy water should run over the incision area. Do not wash directly on the incision for 2 weeks. Remove the glue after 2 weeks.   Driving Restrictions   Complete by: As directed    No driving for 2 weeks. May ride in the car locally now. May begin to drive locally in 2 weeks.   Other Restrictions   Complete by: As directed    Walk gradually increasing distances out in the fresh air at least twice a day. Walking additional 6 times inside the house, gradually increasing distances, daily. No bending, lifting, or twisting. Perform activities between shoulder and waist height (that is at counter height when standing or table height when sitting).     Allergies as of 05/30/2019      Reactions   Topiramate Other (See Comments)   REACTION: Angioedema   Codeine Rash   REACTION: Rash and vomitting   Metformin Nausea And Vomiting   REACTION: Nausea and vomitting   Percocet [oxycodone-acetaminophen] Nausea And Vomiting   Tramadol Nausea And Vomiting      Medication List    TAKE these medications   Accu-Chek Aviva Plus test strip Generic drug: glucose blood   acetaminophen 500 MG tablet Commonly known as: TYLENOL Take 1,000 mg by mouth every 6 (six) hours as needed (pain).   APPLE CIDER VINEGAR PO Take 450 mg by mouth daily.   cyclobenzaprine 10 MG tablet Commonly known as: FLEXERIL Take 0.5-1 tablets (5-10 mg total) by mouth 3 (three) times daily as needed for muscle spasms.   diclofenac sodium 1 % Gel Commonly known as: VOLTAREN Apply 1 application topically daily as needed (back pain).   DULoxetine 60 MG capsule Commonly known as: CYMBALTA Take 60  mg by mouth daily.   gabapentin 400 MG capsule Commonly known as: NEURONTIN Take 400 mg by mouth 3 (three) times daily.   HYDROmorphone 2 MG tablet Commonly known as: DILAUDID Take 1-2 tablets (2-4 mg total) by mouth every 4 (four) hours as needed (pain).   Jardiance 25 MG Tabs tablet Generic drug: empagliflozin Take 25 mg by  mouth daily.   lisinopril-hydrochlorothiazide 20-12.5 MG tablet Commonly known as: ZESTORETIC Take 1 tablet by mouth daily.   NovoLOG FlexPen 100 UNIT/ML FlexPen Generic drug: insulin aspart Inject 50 Units into the skin 3 (three) times daily with meals.   omeprazole 20 MG capsule Commonly known as: PRILOSEC Take 20 mg by mouth 2 (two) times daily before a meal.   PORTIA-28 PO Take 1 tablet by mouth daily.   rosuvastatin 40 MG tablet Commonly known as: CRESTOR Take 40 mg by mouth daily.   sitaGLIPtin 100 MG tablet Commonly known as: JANUVIA Take 100 mg by mouth daily.   Tyler Aas FlexTouch 200 UNIT/ML Sopn Generic drug: Insulin Degludec Inject 150 Units into the skin every morning.   Trilipix 135 MG capsule Generic drug: Choline Fenofibrate Take 135 mg by mouth daily.   Turmeric 500 MG Caps Take 500 mg by mouth daily.            Durable Medical Equipment  (From admission, onward)         Start     Ordered   05/30/19 0957  For home use only DME 3 n 1  Once     05/30/19 0956           Discharge Care Instructions  (From admission, onward)         Start     Ordered   05/30/19 0000  Discharge wound care:    Comments: Leave the wound open to air. Shower daily with the wound uncovered. Water and soapy water should run over the incision area. Do not wash directly on the incision for 2 weeks. Remove the glue after 2 weeks.   05/30/19 1003           Signed: Hosie Spangle 05/30/2019, 10:03 AM

## 2019-06-02 MED FILL — Sodium Chloride IV Soln 0.9%: INTRAVENOUS | Qty: 1000 | Status: AC

## 2019-06-02 MED FILL — Heparin Sodium (Porcine) Inj 1000 Unit/ML: INTRAMUSCULAR | Qty: 30 | Status: AC

## 2020-01-12 ENCOUNTER — Other Ambulatory Visit (HOSPITAL_COMMUNITY): Payer: Self-pay | Admitting: Internal Medicine

## 2020-01-12 ENCOUNTER — Other Ambulatory Visit: Payer: Self-pay

## 2020-01-12 ENCOUNTER — Ambulatory Visit (HOSPITAL_COMMUNITY)
Admission: RE | Admit: 2020-01-12 | Discharge: 2020-01-12 | Disposition: A | Discharge status: Home / Self Care | Payer: BC Managed Care – PPO | Source: Ambulatory Visit | Attending: Internal Medicine | Admitting: Internal Medicine

## 2020-01-12 DIAGNOSIS — R079 Chest pain, unspecified: Secondary | ICD-10-CM | POA: Insufficient documentation

## 2020-01-12 DIAGNOSIS — R05 Cough: Secondary | ICD-10-CM | POA: Insufficient documentation

## 2020-01-12 DIAGNOSIS — R059 Cough, unspecified: Secondary | ICD-10-CM

## 2020-10-17 ENCOUNTER — Encounter: Payer: Self-pay | Admitting: Emergency Medicine

## 2020-10-17 ENCOUNTER — Ambulatory Visit
Admission: EM | Admit: 2020-10-17 | Discharge: 2020-10-17 | Disposition: A | Discharge status: Home / Self Care | Payer: BC Managed Care – PPO | Attending: Emergency Medicine | Admitting: Emergency Medicine

## 2020-10-17 ENCOUNTER — Ambulatory Visit (INDEPENDENT_AMBULATORY_CARE_PROVIDER_SITE_OTHER): Payer: BC Managed Care – PPO

## 2020-10-17 DIAGNOSIS — M25531 Pain in right wrist: Secondary | ICD-10-CM

## 2020-10-17 DIAGNOSIS — W19XXXA Unspecified fall, initial encounter: Secondary | ICD-10-CM

## 2020-10-17 HISTORY — DX: Depression, unspecified: F32.A

## 2020-10-17 NOTE — ED Triage Notes (Signed)
Reports tripping and falling on Saturday.  Pain to RT wrist since then that wont get better.

## 2020-10-17 NOTE — Discharge Instructions (Addendum)
Take OTC Tylenol/ibuprofen as needed for pain Follow RICE instruction that is attached Follow-up with PCP Return or go to ED if you develop any new or worsening of symptoms

## 2020-10-17 NOTE — ED Provider Notes (Signed)
[place]   [id number] 10/17/20 Arrival Time: 0917   Chief Complaint  Patient presents with  . Wrist Pain     SUBJECTIVE: History from: patient.  Tamara Solomon is a 49 y.o. female who presented to the urgent care with a complaint of right wrist pain for the past 5 days.  Developed the symptom after falling.  She localized the pain to the right wrist.  She describes the pain as constant and achy.  She has tried OTC medications with mild relief.  Her symptoms are made worse with ROM.  She denies similar symptoms in the past.  Denies chills, fever, nausea, vomiting, diarrhea.  ROS: As per HPI.  All other pertinent ROS negative.      Past Medical History:  Diagnosis Date  . Anxiety   . Arthritis    lumbar stenosis  . Complication of anesthesia   . Depression   . Diabetes mellitus   . Family history of adverse reaction to anesthesia    family history of N&V  . GERD (gastroesophageal reflux disease)   . Hyperlipidemia   . Hypertension   . Irritable bowel syndrome   . PONV (postoperative nausea and vomiting)    Past Surgical History:  Procedure Laterality Date  . CARPAL TUNNEL RELEASE     Patient has had three times - 2 times right hand, one time to the left  . CERVICAL CONE BIOPSY  2009  . CHOLECYSTECTOMY  1997  . COLONOSCOPY    . DILATION AND CURETTAGE OF UTERUS  2009  . HERNIA REPAIR  2004  . LIVER SURGERY  2000   Liver resection  . NM MYOCAR PERF WALL MOTION  02/16/2012   low risk study.  Small area of breast attenuation  . TONSILLECTOMY  1978  . TRIGGER FINGER RELEASE Bilateral    several times  . UPPER GASTROINTESTINAL ENDOSCOPY    . US ECHOCARDIOGRAPHY  02/16/2012   mild concentric LVH, EF >55%   Allergies  Allergen Reactions  . Topiramate Other (See Comments)    REACTION: Angioedema  . Codeine Rash    REACTION: Rash and vomitting  . Metformin Nausea And Vomiting    REACTION: Nausea and vomitting  . Percocet [Oxycodone-Acetaminophen]  Nausea And Vomiting  . Tramadol Nausea And Vomiting   No current facility-administered medications on file prior to encounter.   Current Outpatient Medications on File Prior to Encounter  Medication Sig Dispense Refill  . ACCU-CHEK AVIVA PLUS test strip     . acetaminophen (TYLENOL) 500 MG tablet Take 1,000 mg by mouth every 6 (six) hours as needed (pain).    . APPLE CIDER VINEGAR PO Take 450 mg by mouth daily.    . Choline Fenofibrate (TRILIPIX) 135 MG capsule Take 135 mg by mouth daily.    . cyclobenzaprine (FLEXERIL) 10 MG tablet Take 0.5-1 tablets (5-10 mg total) by mouth 3 (three) times daily as needed for muscle spasms. 75 tablet 0  . diclofenac sodium (VOLTAREN) 1 % GEL Apply 1 application topically daily as needed (back pain).    . DULoxetine (CYMBALTA) 60 MG capsule Take 60 mg by mouth daily.    . empagliflozin (JARDIANCE) 25 MG TABS tablet Take 25 mg by mouth daily.    Marland Kitchen gabapentin (NEURONTIN) 400 MG capsule Take 400 mg by mouth 3 (three) times daily.    Marland Kitchen HYDROmorphone (DILAUDID) 2 MG tablet Take 1-2 tablets (2-4 mg total) by mouth every 4 (four) hours as needed (pain). 50 tablet 0  .  insulin aspart (NOVOLOG FLEXPEN) 100 UNIT/ML FlexPen Inject 50 Units into the skin 3 (three) times daily with meals.    . Insulin Degludec (TRESIBA FLEXTOUCH) 200 UNIT/ML SOPN Inject 150 Units into the skin every morning.    . Levonorgestrel-Ethinyl Estrad (PORTIA-28 PO) Take 1 tablet by mouth daily.     Marland Kitchen lisinopril-hydrochlorothiazide (PRINZIDE,ZESTORETIC) 20-12.5 MG per tablet Take 1 tablet by mouth daily.    Marland Kitchen omeprazole (PRILOSEC) 20 MG capsule Take 20 mg by mouth 2 (two) times daily before a meal.    . rosuvastatin (CRESTOR) 40 MG tablet Take 40 mg by mouth daily.     . sitaGLIPtin (JANUVIA) 100 MG tablet Take 100 mg by mouth daily.    . Turmeric 500 MG CAPS Take 500 mg by mouth daily.     Social History   Socioeconomic History  . Marital status: Married    Spouse name: Not on file  .  Number of children: Not on file  . Years of education: Not on file  . Highest education level: Not on file  Occupational History  . Not on file  Tobacco Use  . Smoking status: Never Smoker  . Smokeless tobacco: Never Used  Substance and Sexual Activity  . Alcohol use: No  . Drug use: No  . Sexual activity: Never  Other Topics Concern  . Not on file  Social History Narrative  . Not on file   Social Determinants of Health   Financial Resource Strain:   . Difficulty of Paying Living Expenses: Not on file  Food Insecurity:   . Worried About Charity fundraiser in the Last Year: Not on file  . Ran Out of Food in the Last Year: Not on file  Transportation Needs:   . Lack of Transportation (Medical): Not on file  . Lack of Transportation (Non-Medical): Not on file  Physical Activity:   . Days of Exercise per Week: Not on file  . Minutes of Exercise per Session: Not on file  Stress:   . Feeling of Stress : Not on file  Social Connections:   . Frequency of Communication with Friends and Family: Not on file  . Frequency of Social Gatherings with Friends and Family: Not on file  . Attends Religious Services: Not on file  . Active Member of Clubs or Organizations: Not on file  . Attends Archivist Meetings: Not on file  . Marital Status: Not on file  Intimate Partner Violence:   . Fear of Current or Ex-Partner: Not on file  . Emotionally Abused: Not on file  . Physically Abused: Not on file  . Sexually Abused: Not on file   Family History  Problem Relation Age of Onset  . Healthy Mother   . Liver cancer Father   . Healthy Son   . Diabetes Brother     OBJECTIVE:  Vitals:   10/17/20 0931 10/17/20 0932  BP: (!) 145/81   Pulse: (!) 108   Resp: 19   Temp: 98.7 F (37.1 C)   TempSrc: Oral   SpO2: 95%   Weight:  242 lb (109.8 kg)  Height:  5\' 3"  (1.6 m)     Physical Exam Vitals and nursing note reviewed.  Constitutional:      General: She is not in acute  distress.    Appearance: Normal appearance. She is normal weight. She is not ill-appearing, toxic-appearing or diaphoretic.  HENT:     Head: Normocephalic.  Cardiovascular:  Rate and Rhythm: Normal rate and regular rhythm.     Pulses: Normal pulses.     Heart sounds: Normal heart sounds. No murmur heard.  No friction rub. No gallop.   Pulmonary:     Effort: Pulmonary effort is normal. No respiratory distress.     Breath sounds: Normal breath sounds. No stridor. No wheezing, rhonchi or rales.  Chest:     Chest wall: No tenderness.  Musculoskeletal:        General: Tenderness present.     Right wrist: Tenderness present. No swelling.     Left wrist: Normal.     Comments: The right wrist is without any obvious asymmetry or deformity when compared to the left wrist.  There is no ecchymosis, open wound, lesion, warmth, swelling present.  Limited range of motion due to pain.  Neurovascular status intact.  Neurological:     Mental Status: She is alert and oriented to person, place, and time.      LABS:  No results found for this or any previous visit (from the past 24 hour(s)).   RADIOLOGY:  DG Wrist Complete Right  Result Date: 10/17/2020 CLINICAL DATA:  Right wrist pain after fall last night. EXAM: RIGHT WRIST - COMPLETE 3+ VIEW COMPARISON:  December 24, 2018. FINDINGS: There is no evidence of fracture or dislocation. There is no evidence of arthropathy or other focal bone abnormality. Soft tissues are unremarkable. IMPRESSION: Negative. Electronically Signed   By: Marijo Conception M.D.   On: 10/17/2020 09:58   The right wrist x-ray is negative for bony abnormality including fracture or dislocation.  I have reviewed the x-ray myself and the radiologist interpretation.  I am in agreement with the radiologist interpretation.   ASSESSMENT & PLAN:  1. Fall, initial encounter   2. Right wrist pain     No orders of the defined types were placed in this encounter.  Discharge  instructions  Take OTC Tylenol/ibuprofen as needed for pain Follow RICE instruction that is attached Follow-up with PCP Return or go to ED if you develop any new or worsening of symptoms  Reviewed expectations re: course of current medical issues. Questions answered. Outlined signs and symptoms indicating need for more acute intervention. Patient verbalized understanding. After Visit Summary given.         Emerson Monte, FNP 10/17/20 1010

## 2020-11-22 ENCOUNTER — Other Ambulatory Visit: Payer: Self-pay | Admitting: Internal Medicine

## 2020-11-22 ENCOUNTER — Other Ambulatory Visit (HOSPITAL_COMMUNITY): Payer: Self-pay | Admitting: Internal Medicine

## 2020-11-22 DIAGNOSIS — R1310 Dysphagia, unspecified: Secondary | ICD-10-CM

## 2020-11-26 ENCOUNTER — Encounter (INDEPENDENT_AMBULATORY_CARE_PROVIDER_SITE_OTHER): Payer: Self-pay | Admitting: *Deleted

## 2020-11-28 ENCOUNTER — Ambulatory Visit (HOSPITAL_COMMUNITY)
Admission: RE | Admit: 2020-11-28 | Discharge: 2020-11-28 | Disposition: A | Discharge status: Home / Self Care | Payer: BC Managed Care – PPO | Source: Ambulatory Visit | Attending: Internal Medicine | Admitting: Internal Medicine

## 2020-11-28 ENCOUNTER — Other Ambulatory Visit: Payer: Self-pay

## 2020-11-28 DIAGNOSIS — R1310 Dysphagia, unspecified: Secondary | ICD-10-CM | POA: Diagnosis present

## 2021-02-21 ENCOUNTER — Other Ambulatory Visit (HOSPITAL_COMMUNITY): Payer: Self-pay | Admitting: Internal Medicine

## 2021-02-21 ENCOUNTER — Other Ambulatory Visit: Payer: Self-pay | Admitting: Internal Medicine

## 2021-02-21 DIAGNOSIS — R160 Hepatomegaly, not elsewhere classified: Secondary | ICD-10-CM

## 2021-02-21 DIAGNOSIS — R1011 Right upper quadrant pain: Secondary | ICD-10-CM

## 2021-02-24 ENCOUNTER — Other Ambulatory Visit: Payer: Self-pay | Admitting: Internal Medicine

## 2021-02-24 DIAGNOSIS — R160 Hepatomegaly, not elsewhere classified: Secondary | ICD-10-CM

## 2021-02-24 DIAGNOSIS — R1011 Right upper quadrant pain: Secondary | ICD-10-CM

## 2021-03-06 ENCOUNTER — Ambulatory Visit (INDEPENDENT_AMBULATORY_CARE_PROVIDER_SITE_OTHER): Payer: BC Managed Care – PPO | Admitting: Gastroenterology

## 2021-03-08 ENCOUNTER — Other Ambulatory Visit: Payer: Self-pay

## 2021-03-08 ENCOUNTER — Ambulatory Visit
Admission: RE | Admit: 2021-03-08 | Discharge: 2021-03-08 | Disposition: A | Discharge status: Home / Self Care | Payer: BC Managed Care – PPO | Source: Ambulatory Visit | Attending: Internal Medicine | Admitting: Internal Medicine

## 2021-03-08 DIAGNOSIS — R1011 Right upper quadrant pain: Secondary | ICD-10-CM

## 2021-03-08 DIAGNOSIS — R160 Hepatomegaly, not elsewhere classified: Secondary | ICD-10-CM

## 2021-03-08 MED ORDER — GADOBENATE DIMEGLUMINE 529 MG/ML IV SOLN
20.0000 mL | Freq: Once | INTRAVENOUS | Status: AC | PRN
  Administered 2021-03-08: 20 mL via INTRAVENOUS

## 2021-03-22 ENCOUNTER — Other Ambulatory Visit: Payer: Self-pay

## 2021-07-28 ENCOUNTER — Other Ambulatory Visit (HOSPITAL_COMMUNITY): Payer: Self-pay | Admitting: Neurosurgery

## 2021-07-28 ENCOUNTER — Other Ambulatory Visit: Payer: Self-pay | Admitting: Neurosurgery

## 2021-07-28 DIAGNOSIS — R102 Pelvic and perineal pain: Secondary | ICD-10-CM

## 2021-08-04 ENCOUNTER — Ambulatory Visit (HOSPITAL_COMMUNITY): Payer: BC Managed Care – PPO

## 2021-08-14 ENCOUNTER — Ambulatory Visit (HOSPITAL_COMMUNITY): Payer: BC Managed Care – PPO

## 2021-08-14 ENCOUNTER — Encounter (HOSPITAL_COMMUNITY): Payer: Self-pay

## 2021-09-12 ENCOUNTER — Other Ambulatory Visit (HOSPITAL_COMMUNITY): Payer: Self-pay | Admitting: Internal Medicine

## 2021-09-12 ENCOUNTER — Other Ambulatory Visit (HOSPITAL_BASED_OUTPATIENT_CLINIC_OR_DEPARTMENT_OTHER): Payer: Self-pay | Admitting: Internal Medicine

## 2021-09-12 DIAGNOSIS — R2241 Localized swelling, mass and lump, right lower limb: Secondary | ICD-10-CM

## 2021-09-17 ENCOUNTER — Other Ambulatory Visit: Payer: Self-pay

## 2021-09-17 ENCOUNTER — Ambulatory Visit (HOSPITAL_COMMUNITY): Admission: RE | Admit: 2021-09-17 | Payer: BC Managed Care – PPO | Source: Ambulatory Visit

## 2021-09-17 ENCOUNTER — Encounter (HOSPITAL_COMMUNITY): Payer: Self-pay

## 2021-09-24 ENCOUNTER — Ambulatory Visit (HOSPITAL_COMMUNITY): Payer: BC Managed Care – PPO

## 2021-10-01 ENCOUNTER — Ambulatory Visit (HOSPITAL_COMMUNITY): Payer: BC Managed Care – PPO

## 2021-10-08 ENCOUNTER — Other Ambulatory Visit: Payer: Self-pay | Admitting: Internal Medicine

## 2021-10-08 ENCOUNTER — Other Ambulatory Visit: Payer: Self-pay

## 2021-10-08 ENCOUNTER — Other Ambulatory Visit (HOSPITAL_COMMUNITY): Payer: Self-pay | Admitting: Internal Medicine

## 2021-10-08 ENCOUNTER — Ambulatory Visit (HOSPITAL_COMMUNITY)
Admission: RE | Admit: 2021-10-08 | Discharge: 2021-10-08 | Disposition: A | Discharge status: Home / Self Care | Payer: BC Managed Care – PPO | Source: Ambulatory Visit | Attending: Internal Medicine | Admitting: Internal Medicine

## 2021-10-08 DIAGNOSIS — R2241 Localized swelling, mass and lump, right lower limb: Secondary | ICD-10-CM

## 2021-10-10 ENCOUNTER — Other Ambulatory Visit: Payer: Self-pay | Admitting: Internal Medicine

## 2021-10-14 ENCOUNTER — Other Ambulatory Visit: Payer: Self-pay | Admitting: Internal Medicine

## 2021-10-14 DIAGNOSIS — R2241 Localized swelling, mass and lump, right lower limb: Secondary | ICD-10-CM

## 2021-10-24 ENCOUNTER — Other Ambulatory Visit: Payer: Self-pay | Admitting: Internal Medicine

## 2021-10-24 DIAGNOSIS — R2241 Localized swelling, mass and lump, right lower limb: Secondary | ICD-10-CM

## 2021-10-25 ENCOUNTER — Ambulatory Visit
Admission: RE | Admit: 2021-10-25 | Discharge: 2021-10-25 | Disposition: A | Discharge status: Home / Self Care | Payer: BC Managed Care – PPO | Source: Ambulatory Visit | Attending: Internal Medicine | Admitting: Internal Medicine

## 2021-10-25 ENCOUNTER — Other Ambulatory Visit: Payer: Self-pay

## 2021-10-25 ENCOUNTER — Inpatient Hospital Stay: Admission: RE | Admit: 2021-10-25 | Payer: BC Managed Care – PPO | Source: Ambulatory Visit

## 2021-10-25 DIAGNOSIS — R2241 Localized swelling, mass and lump, right lower limb: Secondary | ICD-10-CM

## 2021-10-31 ENCOUNTER — Ambulatory Visit: Payer: BC Managed Care – PPO

## 2022-05-29 ENCOUNTER — Encounter (INDEPENDENT_AMBULATORY_CARE_PROVIDER_SITE_OTHER): Payer: Self-pay | Admitting: *Deleted

## 2022-06-23 ENCOUNTER — Other Ambulatory Visit (INDEPENDENT_AMBULATORY_CARE_PROVIDER_SITE_OTHER): Payer: Self-pay

## 2022-06-23 ENCOUNTER — Encounter (INDEPENDENT_AMBULATORY_CARE_PROVIDER_SITE_OTHER): Payer: Self-pay

## 2022-06-23 ENCOUNTER — Telehealth (INDEPENDENT_AMBULATORY_CARE_PROVIDER_SITE_OTHER): Payer: Self-pay

## 2022-06-23 ENCOUNTER — Encounter (INDEPENDENT_AMBULATORY_CARE_PROVIDER_SITE_OTHER): Payer: Self-pay | Admitting: *Deleted

## 2022-06-23 DIAGNOSIS — Z1211 Encounter for screening for malignant neoplasm of colon: Secondary | ICD-10-CM

## 2022-06-23 DIAGNOSIS — Z01812 Encounter for preprocedural laboratory examination: Secondary | ICD-10-CM

## 2022-06-23 MED ORDER — PEG 3350-KCL-NA BICARB-NACL 420 G PO SOLR: 4000.0000 mL | ORAL | 0 refills | Status: DC

## 2022-06-23 NOTE — Telephone Encounter (Signed)
Referring MD/PCP: Dr Wende Neighbors   Procedure: Tcs  Reason/Indication:  Screening   Has patient had this procedure before?  Yes   If so, when, by whom and where?  2007  Is there a family history of colon cancer?  No   Who?  What age when diagnosed?    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  No   Do you have a pacemaker/defibrillator?  no  Has patient ever had endocarditis/atrial fibrillation? no  Does patient use oxygen? no  Has patient had joint replacement within last 12 months?  no  Is patient constipated or do they take laxatives? no  Does patient have a history of alcohol/drug use?  no  Have you had a stroke/heart attack last 6 mths? no  Do you take medicine for weight loss?  no  For female patients,: have you had a hysterectomy no                      are you post menopausal no                      do you still have your menstrual cycle yes   Is patient on blood thinner such as Coumadin, Plavix and/or Aspirin? No   Medications: cymbalta 60 mg daily, jardiance 25 mg daily, Lisisnopril/hctz 20 mg 12.5 1 daily, crestor 40 mg daily, fenofibric acid 135 mg daily, poria 1 daily, protonix 40 mg bid, gabapentin 400 mg tid, diclofenac gell 100 mg prn, Ozempic 0.25 once a week, tresiba 100 units in the am, Novolog 20 units in the AM Vit E daily  Allergies: nkda  Medication Adjustment per Dr Jenetta Downer 1/2 dose of Insulin the evening prior and none the morning of procedure, Hold Vit E 10 days prior   Procedure date & time: 07/16/22 at 1055

## 2022-06-23 NOTE — Telephone Encounter (Signed)
Wash Nienhaus Ann Suhan Paci, CMA  ?

## 2022-07-14 ENCOUNTER — Other Ambulatory Visit (HOSPITAL_COMMUNITY)
Admission: RE | Admit: 2022-07-14 | Discharge: 2022-07-14 | Disposition: A | Discharge status: Home / Self Care | Payer: BC Managed Care – PPO | Source: Ambulatory Visit | Attending: Gastroenterology | Admitting: Gastroenterology

## 2022-07-14 DIAGNOSIS — Z794 Long term (current) use of insulin: Secondary | ICD-10-CM | POA: Diagnosis not present

## 2022-07-14 DIAGNOSIS — K5730 Diverticulosis of large intestine without perforation or abscess without bleeding: Secondary | ICD-10-CM | POA: Diagnosis not present

## 2022-07-14 DIAGNOSIS — Z01812 Encounter for preprocedural laboratory examination: Secondary | ICD-10-CM

## 2022-07-14 DIAGNOSIS — E119 Type 2 diabetes mellitus without complications: Secondary | ICD-10-CM | POA: Diagnosis not present

## 2022-07-14 DIAGNOSIS — Z6841 Body Mass Index (BMI) 40.0 and over, adult: Secondary | ICD-10-CM | POA: Diagnosis not present

## 2022-07-14 DIAGNOSIS — Z1211 Encounter for screening for malignant neoplasm of colon: Secondary | ICD-10-CM

## 2022-07-14 DIAGNOSIS — K219 Gastro-esophageal reflux disease without esophagitis: Secondary | ICD-10-CM | POA: Diagnosis not present

## 2022-07-14 DIAGNOSIS — E785 Hyperlipidemia, unspecified: Secondary | ICD-10-CM | POA: Diagnosis not present

## 2022-07-14 DIAGNOSIS — K589 Irritable bowel syndrome without diarrhea: Secondary | ICD-10-CM | POA: Diagnosis not present

## 2022-07-14 DIAGNOSIS — I10 Essential (primary) hypertension: Secondary | ICD-10-CM | POA: Diagnosis not present

## 2022-07-14 DIAGNOSIS — Z7984 Long term (current) use of oral hypoglycemic drugs: Secondary | ICD-10-CM | POA: Diagnosis not present

## 2022-07-14 LAB — BASIC METABOLIC PANEL
Anion gap: 7 (ref 5–15)
BUN: 17 mg/dL (ref 6–20)
CO2: 25 mmol/L (ref 22–32)
Calcium: 9.6 mg/dL (ref 8.9–10.3)
Chloride: 107 mmol/L (ref 98–111)
Creatinine, Ser: 0.83 mg/dL (ref 0.44–1.00)
GFR, Estimated: >60 mL/min (ref >60)
Glucose, Bld: 151 mg/dL — ABNORMAL HIGH (ref 70–99)
Potassium: 3.6 mmol/L (ref 3.5–5.1)
Sodium: 139 mmol/L (ref 135–145)

## 2022-07-14 LAB — PREGNANCY, URINE: Preg Test, Ur: NEGATIVE

## 2022-07-16 ENCOUNTER — Encounter (HOSPITAL_COMMUNITY): Payer: Self-pay | Admitting: Gastroenterology

## 2022-07-16 ENCOUNTER — Other Ambulatory Visit: Payer: Self-pay

## 2022-07-16 ENCOUNTER — Ambulatory Visit (HOSPITAL_COMMUNITY): Payer: BC Managed Care – PPO | Admitting: Anesthesiology

## 2022-07-16 ENCOUNTER — Encounter (HOSPITAL_COMMUNITY)
Admission: RE | Disposition: A | Discharge status: Home / Self Care | Payer: Self-pay | Source: Ambulatory Visit | Attending: Gastroenterology

## 2022-07-16 ENCOUNTER — Ambulatory Visit (HOSPITAL_COMMUNITY)
Admission: RE | Admit: 2022-07-16 | Discharge: 2022-07-16 | Disposition: A | Discharge status: Home / Self Care | Payer: BC Managed Care – PPO | Source: Ambulatory Visit | Attending: Gastroenterology | Admitting: Gastroenterology

## 2022-07-16 DIAGNOSIS — K5730 Diverticulosis of large intestine without perforation or abscess without bleeding: Secondary | ICD-10-CM | POA: Insufficient documentation

## 2022-07-16 DIAGNOSIS — K219 Gastro-esophageal reflux disease without esophagitis: Secondary | ICD-10-CM | POA: Insufficient documentation

## 2022-07-16 DIAGNOSIS — Z1211 Encounter for screening for malignant neoplasm of colon: Secondary | ICD-10-CM | POA: Diagnosis not present

## 2022-07-16 DIAGNOSIS — K589 Irritable bowel syndrome without diarrhea: Secondary | ICD-10-CM | POA: Insufficient documentation

## 2022-07-16 DIAGNOSIS — E119 Type 2 diabetes mellitus without complications: Secondary | ICD-10-CM | POA: Insufficient documentation

## 2022-07-16 DIAGNOSIS — Z794 Long term (current) use of insulin: Secondary | ICD-10-CM | POA: Insufficient documentation

## 2022-07-16 DIAGNOSIS — Z6841 Body Mass Index (BMI) 40.0 and over, adult: Secondary | ICD-10-CM | POA: Insufficient documentation

## 2022-07-16 DIAGNOSIS — E785 Hyperlipidemia, unspecified: Secondary | ICD-10-CM | POA: Insufficient documentation

## 2022-07-16 DIAGNOSIS — I10 Essential (primary) hypertension: Secondary | ICD-10-CM | POA: Insufficient documentation

## 2022-07-16 DIAGNOSIS — Z7984 Long term (current) use of oral hypoglycemic drugs: Secondary | ICD-10-CM | POA: Insufficient documentation

## 2022-07-16 HISTORY — PX: COLONOSCOPY WITH PROPOFOL: SHX5780

## 2022-07-16 LAB — HM COLONOSCOPY

## 2022-07-16 LAB — GLUCOSE, CAPILLARY: Glucose-Capillary: 100 mg/dL — ABNORMAL HIGH (ref 70–99)

## 2022-07-16 SURGERY — COLONOSCOPY WITH PROPOFOL
Anesthesia: General

## 2022-07-16 MED ORDER — PROPOFOL 500 MG/50ML IV EMUL
INTRAVENOUS | Status: DC | PRN
  Administered 2022-07-16: 150 ug/kg/min via INTRAVENOUS

## 2022-07-16 MED ORDER — PROPOFOL 10 MG/ML IV BOLUS
INTRAVENOUS | Status: DC | PRN
  Administered 2022-07-16: 100 mg via INTRAVENOUS

## 2022-07-16 MED ORDER — LACTATED RINGERS IV SOLN
INTRAVENOUS | Status: DC
Start: 2022-07-16 — End: 2022-07-16

## 2022-07-16 NOTE — Transfer of Care (Signed)
Immediate Anesthesia Transfer of Care Note  Patient: Tamara Solomon  Procedure(s) Performed: COLONOSCOPY WITH PROPOFOL  Patient Location: Endoscopy Unit  Anesthesia Type:General  Level of Consciousness: awake, alert  and oriented  Airway & Oxygen Therapy: Patient Spontanous Breathing  Post-op Assessment: Report given to RN and Post -op Vital signs reviewed and stable  Post vital signs: Reviewed and stable  Last Vitals:  Vitals Value Taken Time  BP 82/41 07/16/22 1129  Temp 36.8 C 07/16/22 1129  Pulse 80 07/16/22 1129  Resp 13 07/16/22 1129  SpO2 98 % 07/16/22 1129    Last Pain:  Vitals:   07/16/22 1129  TempSrc: Oral  PainSc: 0-No pain      Patients Stated Pain Goal: 8 (39/53/20 2334)  Complications: No notable events documented.

## 2022-07-16 NOTE — H&P (Signed)
Tamara Solomon is an 51 y.o. female.   Chief Complaint: Screening colonoscopy HPI: 51 year old female with past medical history of anxiety, depression, diabetes, GERD, hyperlipidemia, hypertension, IBS, coming for screening colonoscopy.  Last colonoscopy was performed in 2007 for evaluation of abdominal pain, patient reports it was normal.  The patient denies having any complaints such as melena, hematochezia, abdominal pain or distention, change in her bowel movement consistency or frequency, no changes in weight recently.  No family history of colorectal cancer.   Past Medical History:  Diagnosis Date   Anxiety    Arthritis    lumbar stenosis   Complication of anesthesia    Depression    Diabetes mellitus    Family history of adverse reaction to anesthesia    family history of N&V   GERD (gastroesophageal reflux disease)    Hyperlipidemia    Hypertension    Irritable bowel syndrome    PONV (postoperative nausea and vomiting)     Past Surgical History:  Procedure Laterality Date   CARPAL TUNNEL RELEASE     Patient has had three times - 2 times right hand, one time to the left   CERVICAL CONE BIOPSY  2009   [place]  2009   HERNIA REPAIR  2004   LIVER SURGERY  2000   Liver resection   NM MYOCAR PERF WALL MOTION  02/16/2012   low risk study.  Small area of breast attenuation   TONSILLECTOMY  1978   TRIGGER FINGER RELEASE Bilateral    several times   UPPER GASTROINTESTINAL ENDOSCOPY     US ECHOCARDIOGRAPHY  02/16/2012   mild concentric LVH, EF >55%    Family History  Problem Relation Age of Onset   Healthy Mother    Liver cancer Father    Healthy Son    Diabetes Brother    Social History:  reports that she has never smoked. She has never used smokeless tobacco. She reports that she does not drink alcohol and does not use drugs.  Allergies:  Allergies  Allergen Reactions   Topiramate Other (See  Comments)    REACTION: Angioedema   Codeine Rash    REACTION: Rash and vomitting   Metformin Nausea And Vomiting    REACTION: Nausea and vomitting   Percocet [Oxycodone-Acetaminophen] Nausea And Vomiting   Tramadol Nausea And Vomiting    Medications Prior to Admission  Medication Sig Dispense Refill   ASHWAGANDHA PO Take 3,000 mg by mouth in the morning.     Berberine Chloride (BERBERINE HCI PO) Take 4,700 mg by mouth in the morning.     Cholecalciferol (VITAMIN D-3) 125 MCG (5000 UT) TABS Take 5,000 Units by mouth in the morning.     Choline Fenofibrate (TRILIPIX) 135 MG capsule Take 135 mg by mouth in the morning.     CINNAMON PO Take 2,000 mg by mouth in the morning.     cyanocobalamin (VITAMIN B12) 500 MCG tablet Take 500 mcg by mouth in the morning.     DULoxetine (CYMBALTA) 60 MG capsule Take 60 mg by mouth in the morning.     empagliflozin (JARDIANCE) 25 MG TABS tablet Take 25 mg by mouth in the morning.     gabapentin (NEURONTIN) 400 MG capsule Take 400 mg by mouth 2 (two) times daily.     Ginger, Zingiber officinalis, (GINGER ROOT) 550 MG CAPS Take 550 mg by mouth in the morning.  insulin aspart (NOVOLOG FLEXPEN) 100 UNIT/ML FlexPen Inject 30 Units into the skin in the morning and at bedtime.     Insulin Degludec (TRESIBA FLEXTOUCH) 200 UNIT/ML SOPN Inject 100 Units into the skin in the morning.     lisinopril-hydrochlorothiazide (PRINZIDE,ZESTORETIC) 20-12.5 MG per tablet Take 1 tablet by mouth in the morning.     magnesium oxide (MAG-OX) 400 (240 Mg) MG tablet Take 400 mg by mouth in the morning.     OZEMPIC, 0.25 OR 0.5 MG/DOSE, 2 MG/3ML SOPN Inject 0.5 mg into the skin every Friday.     pantoprazole (PROTONIX) 40 MG tablet Take 40 mg by mouth 2 (two) times daily.     Probiotic Product (PROBIOTIC DIGESTIVE SUPP PO) Take 1 capsule by mouth in the morning.     rosuvastatin (CRESTOR) 40 MG tablet Take 40 mg by mouth in the morning.     TART CHERRY PO Take 2,400 mg by mouth  in the morning.     Turmeric 500 MG CAPS Take 500 mg by mouth in the morning.     ACCU-CHEK AVIVA PLUS test strip      diclofenac sodium (VOLTAREN) 1 % GEL Apply 1 application  topically 2 (two) times daily as needed (back pain).     ibuprofen (ADVIL) 800 MG tablet Take 800 mg by mouth in the morning.     polyethylene glycol-electrolytes (TRILYTE) 420 g solution Take 4,000 mLs by mouth as directed. 4000 mL 0   promethazine (PHENERGAN) 25 MG tablet Take 25 mg by mouth every 8 (eight) hours as needed for nausea.      Results for orders placed or performed during the hospital encounter of 07/16/22 (from the past 48 hour(s))  Glucose, capillary     Status: Abnormal   Collection Time: 07/16/22  9:36 AM  Result Value Ref Range   Glucose-Capillary 100 (H) 70 - 99 mg/dL    Comment: Glucose reference range applies only to samples taken after fasting for at least 8 hours.   No results found.  Review of Systems  All other systems reviewed and are negative.   Blood pressure 125/78, pulse 79, temperature 99 F (37.2 C), temperature source Oral, resp. rate 16, height '5\' 3"'$  (1.6 m), weight 108.9 kg, SpO2 99 %, unknown if currently breastfeeding. Physical Exam  GENERAL: The patient is AO x3, in no acute distress. HEENT: Head is normocephalic and atraumatic. EOMI are intact. Mouth is well hydrated and without lesions. NECK: Supple. No masses LUNGS: Clear to auscultation. No presence of rhonchi/wheezing/rales. Adequate chest expansion HEART: RRR, normal s1 and s2. ABDOMEN: Soft, nontender, no guarding, no peritoneal signs, and nondistended. BS +. No masses. EXTREMITIES: Without any cyanosis, clubbing, rash, lesions or edema. NEUROLOGIC: AOx3, no focal motor deficit. SKIN: no jaundice, no rashes  Assessment/Plan  51 year old female with past medical history of anxiety, depression, diabetes, GERD, hyperlipidemia, hypertension, IBS, coming for screening colonoscopy. The patient is at average risk for  colorectal cancer.  We will proceed with colonoscopy today.  Harvel Quale, MD 07/16/2022, 10:20 AM

## 2022-07-16 NOTE — Op Note (Signed)
[facility] Patient Name: Tamara Solomon Procedure Date: 07/16/2022 10:56 AM MRN: [id number] Date of Birth: 1971-02-02 Attending MD: Maylon Peppers ,  CSN: [id number] Age: 51 Admit Type: Outpatient Procedure:                Colonoscopy Indications:              Screening for colorectal malignant neoplasm Providers:                Maylon Peppers, Caprice Kluver, Rosina Lowenstein, RN,                            Ladoris Gene Technician, Technician Referring MD:              Medicines:                Monitored Anesthesia Care Complications:            No immediate complications. Estimated Blood Loss:     Estimated blood loss: none. Procedure:                Pre-Anesthesia Assessment:                           - Prior to the procedure, a History and Physical                            was performed, and patient medications, allergies                            and sensitivities were reviewed. The patient's                            tolerance of previous anesthesia was reviewed.                           - The risks and benefits of the procedure and the                            sedation options and risks were discussed with the                            patient. All questions were answered and informed                            consent was obtained.                           - ASA Grade Assessment: II - A patient with mild                            systemic disease.                           After obtaining informed consent, the colonoscope                            was passed under direct vision. Throughout the  procedure, the patient's blood pressure, pulse, and                            oxygen saturations were monitored continuously. The                            [id number]-HQ190L ([id number]) scope was introduced through                            the anus and advanced to the the cecum, identified                            by appendiceal orifice and  ileocecal valve. The                            colonoscopy was performed without difficulty. The                            patient tolerated the procedure well. The quality                            of the bowel preparation was good. Scope In: 11:08:19 AM Scope Out: 11:27:23 AM Scope Withdrawal Time: 0 hours 15 minutes 44 seconds  Total Procedure Duration: 0 hours 19 minutes 4 seconds  Findings:      The perianal and digital rectal examinations were normal.      A few small-mouthed diverticula were found in the sigmoid colon and       descending colon.      The retroflexed view of the distal rectum and anal verge was normal and       showed no anal or rectal abnormalities. Impression:               - Diverticulosis in the sigmoid colon and in the                            descending colon.                           - The distal rectum and anal verge are normal on                            retroflexion view.                           - No specimens collected. Moderate Sedation:      Per Anesthesia Care Recommendation:           - Discharge patient to home (ambulatory).                           - Resume previous diet.                           - Repeat colonoscopy in 10 years for screening  purposes. Procedure Code(s):        --- Professional ---                           M4158, Colorectal cancer screening; colonoscopy on                            individual not meeting criteria for high risk Diagnosis Code(s):        --- Professional ---                           Z12.11, Encounter for screening for malignant                            neoplasm of colon                           K57.30, Diverticulosis of large intestine without                            perforation or abscess without bleeding CPT copyright 2019 American Medical Association. All rights reserved. The codes documented in this report are preliminary and upon coder review may  be revised  to meet current compliance requirements. Maylon Peppers, MD Maylon Peppers,  07/16/2022 11:30:18 AM This report has been signed electronically. Number of Addenda: 0

## 2022-07-16 NOTE — Anesthesia Postprocedure Evaluation (Signed)
Anesthesia Post Note  Patient: Tamara Solomon  Procedure(s) Performed: COLONOSCOPY WITH PROPOFOL  Patient location during evaluation: Phase II Anesthesia Type: General Level of consciousness: awake and alert and oriented Pain management: pain level controlled Vital Signs Assessment: post-procedure vital signs reviewed and stable Respiratory status: spontaneous breathing, nonlabored ventilation and respiratory function stable Cardiovascular status: blood pressure returned to baseline and stable Postop Assessment: no apparent nausea or vomiting Anesthetic complications: no   No notable events documented.   Last Vitals:  Vitals:   07/16/22 1129 07/16/22 1134  BP: (!) 82/41 92/62  Pulse: 80 80  Resp: 13 (!) 22  Temp: 36.8 C   SpO2: 98% 99%    Last Pain:  Vitals:   07/16/22 1134  TempSrc:   PainSc: 0-No pain                 Dajaun Goldring C Laprecious Austill

## 2022-07-16 NOTE — Anesthesia Preprocedure Evaluation (Signed)
Anesthesia Evaluation  Patient identified by MRN, date of birth, ID band Patient awake    Reviewed: Allergy & Precautions, NPO status , Patient's Chart, lab work & pertinent test results  History of Anesthesia Complications (+) PONV, Family history of anesthesia reaction and history of anesthetic complications  Airway Mallampati: II  TM Distance: >3 FB Neck ROM: Full    Dental  (+) Dental Advisory Given, Teeth Intact   Pulmonary neg pulmonary ROS,    Pulmonary exam normal breath sounds clear to auscultation       Cardiovascular hypertension, Pt. on medications Normal cardiovascular exam Rhythm:Regular Rate:Normal     Neuro/Psych  Headaches, PSYCHIATRIC DISORDERS Anxiety Depression  Neuromuscular disease    GI/Hepatic Neg liver ROS, GERD  Medicated and Controlled,  Endo/Other  diabetes, Well Controlled, Type 2, Oral Hypoglycemic Agents, Insulin DependentMorbid obesity  Renal/GU negative Renal ROS  negative genitourinary   Musculoskeletal  (+) Arthritis , Osteoarthritis,    Abdominal   Peds negative pediatric ROS (+)  Hematology negative hematology ROS (+)   Anesthesia Other Findings   Reproductive/Obstetrics negative OB ROS                             Anesthesia Physical Anesthesia Plan  ASA: 3  Anesthesia Plan: General   Post-op Pain Management: Minimal or no pain anticipated   Induction: Intravenous  PONV Risk Score and Plan:   Airway Management Planned: Natural Airway and Nasal Cannula  Additional Equipment:   Intra-op Plan:   Post-operative Plan:   Informed Consent: I have reviewed the patients History and Physical, chart, labs and discussed the procedure including the risks, benefits and alternatives for the proposed anesthesia with the patient or authorized representative who has indicated his/her understanding and acceptance.     Dental advisory given  Plan  Discussed with: CRNA and Surgeon  Anesthesia Plan Comments:         Anesthesia Quick Evaluation

## 2022-07-16 NOTE — Discharge Instructions (Signed)
You are being discharged to home.  Resume your previous diet.  Your physician has recommended a repeat colonoscopy in 10 years for screening purposes.  

## 2022-07-20 ENCOUNTER — Encounter (INDEPENDENT_AMBULATORY_CARE_PROVIDER_SITE_OTHER): Payer: Self-pay | Admitting: *Deleted

## 2022-07-23 ENCOUNTER — Encounter (HOSPITAL_COMMUNITY): Payer: Self-pay | Admitting: Gastroenterology

## 2022-09-07 ENCOUNTER — Ambulatory Visit (HOSPITAL_COMMUNITY)
Admission: RE | Admit: 2022-09-07 | Discharge: 2022-09-07 | Disposition: A | Discharge status: Home / Self Care | Payer: BC Managed Care – PPO | Source: Ambulatory Visit | Attending: Family Medicine | Admitting: Family Medicine

## 2022-09-07 ENCOUNTER — Other Ambulatory Visit (HOSPITAL_COMMUNITY): Payer: Self-pay | Admitting: Family Medicine

## 2022-09-07 DIAGNOSIS — M79601 Pain in right arm: Secondary | ICD-10-CM | POA: Diagnosis present

## 2022-09-07 DIAGNOSIS — M542 Cervicalgia: Secondary | ICD-10-CM | POA: Insufficient documentation

## 2022-10-08 ENCOUNTER — Ambulatory Visit (INDEPENDENT_AMBULATORY_CARE_PROVIDER_SITE_OTHER): Payer: BC Managed Care – PPO | Admitting: Gastroenterology

## 2022-11-23 ENCOUNTER — Other Ambulatory Visit (HOSPITAL_COMMUNITY): Payer: Self-pay | Admitting: Family Medicine

## 2022-11-23 ENCOUNTER — Ambulatory Visit (HOSPITAL_COMMUNITY)
Admission: RE | Admit: 2022-11-23 | Discharge: 2022-11-23 | Disposition: A | Discharge status: Home / Self Care | Payer: BC Managed Care – PPO | Source: Ambulatory Visit | Attending: Family Medicine | Admitting: Family Medicine

## 2022-11-23 DIAGNOSIS — J019 Acute sinusitis, unspecified: Secondary | ICD-10-CM

## 2023-02-09 ENCOUNTER — Ambulatory Visit (HOSPITAL_COMMUNITY): Payer: BC Managed Care – PPO | Attending: Internal Medicine

## 2023-02-09 ENCOUNTER — Other Ambulatory Visit: Payer: Self-pay

## 2023-02-09 DIAGNOSIS — R262 Difficulty in walking, not elsewhere classified: Secondary | ICD-10-CM | POA: Insufficient documentation

## 2023-02-09 DIAGNOSIS — M7918 Myalgia, other site: Secondary | ICD-10-CM | POA: Diagnosis present

## 2023-02-09 NOTE — Therapy (Signed)
OUTPATIENT PHYSICAL THERAPY LOWER EXTREMITY EVALUATION   Patient Name: Tamara Solomon MRN: [id number] DOB:1971/04/28, 52 y.o., female Today's Date: 02/09/2023  END OF SESSION:  PT End of Session - 02/09/23 0946     Visit Number 1    Number of Visits 6    Date for PT Re-Evaluation 03/23/23    Authorization Type BCBS state health plan    Authorization Time Period no auth required    PT Start Time 0945    PT Stop Time 1025    PT Time Calculation (min) 40 min    Activity Tolerance Patient tolerated treatment well    Behavior During Therapy WFL for tasks assessed/performed             Past Medical History:  Diagnosis Date   Anxiety    Arthritis    lumbar stenosis   Complication of anesthesia    Depression    Diabetes mellitus    Family history of adverse reaction to anesthesia    family history of N&V   GERD (gastroesophageal reflux disease)    Hyperlipidemia    Hypertension    Irritable bowel syndrome    PONV (postoperative nausea and vomiting)    Past Surgical History:  Procedure Laterality Date   CARPAL TUNNEL RELEASE     Patient has had three times - 2 times right hand, one time to the left   CERVICAL CONE BIOPSY  2009   CHOLECYSTECTOMY  1997   COLONOSCOPY     COLONOSCOPY WITH PROPOFOL N/A 07/16/2022   Procedure: COLONOSCOPY WITH PROPOFOL;  Surgeon: Harvel Quale, MD;  Location: [facility];  Service: Gastroenterology;  Laterality: N/A;  1055 ASA 2   DILATION AND CURETTAGE OF UTERUS  2009   HERNIA REPAIR  2004   LIVER SURGERY  2000   Liver resection   NM MYOCAR PERF WALL MOTION  02/16/2012   low risk study.  Small area of breast attenuation   TONSILLECTOMY  1978   TRIGGER FINGER RELEASE Bilateral    several times   UPPER GASTROINTESTINAL ENDOSCOPY     US ECHOCARDIOGRAPHY  02/16/2012   mild concentric LVH, EF >55%   Patient Active Problem List   Diagnosis Date Noted   Lumbar stenosis with neurogenic claudication 05/29/2019   Liver  masses 11/14/2012   TRANSAMINASES, SERUM, ELEVATED 07/24/2009   EPIGASTRIC PAIN 06/13/2009   Diarrhea 05/22/2009   MELENA 04/10/2009   BURN, FIRST DEGREE, ABDOMINAL WALL 04/10/2009   DIABETES MELLITUS, TYPE II, UNCONTROLLED 08/23/2007   HYPERLIPIDEMIA 11/10/2006   MORBID OBESITY 11/10/2006   ANXIETY 11/10/2006   DEPRESSION 11/10/2006   MIGRAINE HEADACHE 11/10/2006   CARPAL TUNNEL SYNDROME 11/10/2006   HYPERTENSION 11/10/2006   ALLERGIC RHINITIS 11/10/2006   GERD 11/10/2006   IBS 11/10/2006    PCP: Celene Squibb, MD  REFERRING PROVIDER: Irving Copas, PA-C  REFERRING DIAG: [id number] (ICD-10-CM) - Pain in right hip  THERAPY DIAG:  Right buttock pain  Rationale for Evaluation and Treatment: Rehabilitation  ONSET DATE: summer 2021  SUBJECTIVE:   SUBJECTIVE STATEMENT: Tamara Solomon presents today for evaluation of right posterior buttock pain. She tells me she initially began having pain after pulling weeds in the summer of 2021. She had an MRI which showed hamstring tendinopathy, and was sent for ischial bursa injections. She had two injections which provided good relief for about 3-4 months each. She then had 2 injections with no relief. She feels this pain has limited her from rest, work, and  exercise; pain has continually worsened and now goes down to her right ankle PERTINENT HISTORY: na PAIN:  Are you having pain? Yes: NPRS scale: 8/10 Pain location: right glute pain Pain description: sharp, stabbing and goes down to right ankle Aggravating factors: sitting on it Relieving factors: none  PRECAUTIONS: None  WEIGHT BEARING RESTRICTIONS: No  FALLS:  Has patient fallen in last 6 months? No  LIVING ENVIRONMENT: Lives with: lives with their family Lives in: House/apartment Stairs: Yes: External: 2 steps; none Has following equipment at home: Single point cane and Walker - 2 wheeled  OCCUPATION: work for the school system  PLOF: Independent  PATIENT GOALS: less  pain  NEXT MD VISIT: Dr. Alvan Dame 03/11/23  OBJECTIVE:   DIAGNOSTIC FINDINGS:   CLINICAL DATA:  Right-sided buttock mass.   EXAM: MRI PELVIS WITHOUT CONTRAST   TECHNIQUE: Multiplanar multisequence MR imaging of the pelvis was performed. No intravenous contrast was administered.   COMPARISON:  None.   FINDINGS: Urinary Tract: The bladder is unremarkable. No bladder mass or calculi.   Bowel: The rectum, sigmoid colon and visualized small-bowel loops are grossly normal.   Vascular/Lymphatic: No aneurysm adenopathy.   Reproductive:  The uterus and ovaries are unremarkable.   Other:  No free pelvic fluid collections, pelvic mass or adenopathy.   No subcutaneous mass is identified in the region of the right buttock area. There is diffuse but symmetric appearing fatty change involving the gluteus maximus muscles bilaterally. No muscle mass or lipoma. No muscle tear or intramuscular hematoma.   Musculoskeletal: Both hips are normally located. Mild degenerative changes bilaterally but no stress fracture or AVN. No hip joint effusion or periarticular fluid collections. Mild bilateral Parry tendinosis without trochanteric bursitis.   There is bilateral hamstring tendinopathy with thickening, small interstitial tears and some surrounding inflammation. No complete tear/rupture. On the left side there is edema like signal changes in the quadratus femoris between the ischium and the femur suggesting ischial femoral impingement syndrome.   IMPRESSION: 1. No soft tissue mass in the subcutaneous fat or underlying gluteus maximus muscle in the right buttock area is identified. 2. Mild bilateral hip joint degenerative changes but no stress fracture or AVN. 3. Bilateral hamstring tendinopathy with thickening, small interstitial tears and some surrounding inflammation. No complete tear/rupture. 4. Edema like signal changes in the left quadratus femoris muscle between the ischium and the  femur suggesting ischiofemoral impingement syndrome.     Electronically Signed   By: Marijo Sanes M.D.   On: 10/27/2021 13:18   PATIENT SURVEYS:  FOTO 40  COGNITION: Overall cognitive status: Within functional limits for tasks assessed     SENSATION: Burning down right leg laterally     PALPATION: Tender right hamstring especially at origin; ischial tub  LOWER EXTREMITY ROM:  Active ROM Right eval Left eval  Hip flexion AAROM 42 degrees   Hip extension    Hip abduction    Hip adduction    Hip internal rotation    Hip external rotation    Knee flexion    Knee extension    Ankle dorsiflexion    Ankle plantarflexion    Ankle inversion    Ankle eversion     (Blank rows = not tested)  LOWER EXTREMITY MMT:  MMT Right eval Left eval  Hip flexion 4 4+  Hip extension 3+* 4+  Hip abduction    Hip adduction    Hip internal rotation    Hip external rotation    Knee flexion  3-* 4+  Knee extension 5 5  Ankle dorsiflexion 5 5  Ankle plantarflexion    Ankle inversion    Ankle eversion     (Blank rows = not tested)  FUNCTIONAL TESTS:  5 times sit to stand: 26.70  GAIT: Distance walked: 50 ft Assistive device utilized: None Level of assistance: Modified independence Comments: slow antalgic gait   TODAY'S TREATMENT:                                                                                                                              DATE: physical therapy evaluation and HEP instruction    PATIENT EDUCATION:  Education details: Patient educated on exam findings, POC, scope of PT, HEP, what to expect next visit. Person educated: Patient Education method: Explanation, Demonstration, and Handouts Education comprehension: verbalized understanding, returned demonstration, verbal cues required, and tactile cues required   HOME EXERCISE PROGRAM: Access Code: [email or web address] URL: [email or web address] Date: 02/09/2023 Prepared by: [facility] -  Rehab  Exercises - Supine Isometric Hamstring Set  - 2 x daily - 7 x weekly - 1 sets - 10 reps - 5 sec hold - Hooklying Hamstring Stretch with Strap  - 1 x daily - 7 x weekly - 3 sets - 10 reps  ASSESSMENT:  CLINICAL IMPRESSION: Patient is a 52 y.o. female who was seen today for physical therapy evaluation and treatment for right posterior hip/glute pain. Patient  presents to physical therapy with complaint of right glute pain that is constant. Patient demonstrates muscle weakness, reduced ROM, and fascial restrictions which are likely contributing to symptoms of pain and are negatively impacting patient ability to perform ADLs and functional mobility tasks. Patient will benefit from skilled physical therapy services to address these deficits to reduce pain and improve level of function with ADLs and functional mobility tasks.   OBJECTIVE IMPAIRMENTS: Abnormal gait, decreased activity tolerance, decreased endurance, decreased mobility, difficulty walking, decreased ROM, decreased strength, hypomobility, increased fascial restrictions, impaired perceived functional ability, impaired flexibility, and pain.   ACTIVITY LIMITATIONS: carrying, lifting, bending, sitting, standing, squatting, sleeping, stairs, transfers, bed mobility, hygiene/grooming, locomotion level, and caring for others  PARTICIPATION LIMITATIONS: meal prep, cleaning, laundry, driving, shopping, community activity, occupation, yard work, and school  PERSONAL FACTORS: Time since onset of injury/illness/exacerbation are also affecting patient's functional outcome.   REHAB POTENTIAL: Good  CLINICAL DECISION MAKING: Stable/uncomplicated  EVALUATION COMPLEXITY: Low   GOALS: Goals reviewed with patient? No  SHORT TERM GOALS: Target date: 03/02/2023 patient will be independent with initial HEP  Baseline: Goal status: INITIAL  2.  Patient will self report 30% improvement to improve tolerance for functional  activity  Baseline:  Goal status: INITIAL    LONG TERM GOALS: Target date: 04  Patient will be independent in self management strategies to improve quality of life and functional outcomes.   Baseline:  Goal status: INITIAL  2.  Patient will self report 50% improvement to improve tolerance for functional activity  Baseline:  Goal status: INITIAL  3.  Patient will improve FOTO score to predicted value  Baseline: 40 Goal status: INITIAL  4.  Patient will be able to sit x 30 min without pain > 4/10 to improve tolerance for sitting to perform school tasks/ work tasks Baseline: 8/10 Goal status: INITIAL  5.  Patient will increase  right hip to 4+- 5/5 without pain  > 4/10 to promote return to ambulation community distances with minimal deviation.  Baseline: see above Goal status: INITIAL    PLAN:  PT FREQUENCY: 1x/week  PT DURATION: 6 weeks  PLANNED INTERVENTIONS: Therapeutic exercises, Therapeutic activity, Neuromuscular re-education, Balance training, Gait training, Patient/Family education, Joint manipulation, Joint mobilization, Stair training, Orthotic/Fit training, DME instructions, Aquatic Therapy, Dry Needling, Electrical stimulation, Spinal manipulation, Spinal mobilization, Cryotherapy, Moist heat, Compression bandaging, scar mobilization, Splintting, Taping, Traction, Ultrasound, Ionotophoresis '4mg'$ /ml Dexamethasone, and Manual therapy   PLAN FOR NEXT SESSION: Review HEP and goals; progress right hip strengthening; stretching as able   10:54 AM, 02/09/23 Lateefah Mallery Small Tenya Araque MPT [facility] [place] [phone] [phone]

## 2023-02-18 ENCOUNTER — Ambulatory Visit (HOSPITAL_COMMUNITY): Payer: BC Managed Care – PPO | Admitting: Physical Therapy

## 2023-02-18 DIAGNOSIS — R262 Difficulty in walking, not elsewhere classified: Secondary | ICD-10-CM

## 2023-02-18 DIAGNOSIS — M7918 Myalgia, other site: Secondary | ICD-10-CM | POA: Diagnosis not present

## 2023-02-18 NOTE — Therapy (Signed)
OUTPATIENT PHYSICAL THERAPY LOWER EXTREMITY TREATMENT   Patient Name: Tamara Solomon MRN: [id number] DOB:Jun 25, 1971, 52 y.o., female Today's Date: 02/18/2023  END OF SESSION:  PT End of Session - 02/18/23 1557     Visit Number 2    Number of Visits 6    Date for PT Re-Evaluation 03/23/23    Authorization Type BCBS state health plan    Authorization Time Period no auth required    PT Start Time 1600    PT Stop Time 1640    PT Time Calculation (min) 40 min    Activity Tolerance Patient tolerated treatment well    Behavior During Therapy WFL for tasks assessed/performed             Past Medical History:  Diagnosis Date   Anxiety    Arthritis    lumbar stenosis   Complication of anesthesia    Depression    Diabetes mellitus    Family history of adverse reaction to anesthesia    family history of N&V   GERD (gastroesophageal reflux disease)    Hyperlipidemia    Hypertension    Irritable bowel syndrome    PONV (postoperative nausea and vomiting)    Past Surgical History:  Procedure Laterality Date   CARPAL TUNNEL RELEASE     Patient has had three times - 2 times right hand, one time to the left   CERVICAL CONE BIOPSY  2009   CHOLECYSTECTOMY  1997   COLONOSCOPY     COLONOSCOPY WITH PROPOFOL N/A 07/16/2022   Procedure: COLONOSCOPY WITH PROPOFOL;  Surgeon: Harvel Quale, MD;  Location: [facility];  Service: Gastroenterology;  Laterality: N/A;  1055 ASA 2   DILATION AND CURETTAGE OF UTERUS  2009   HERNIA REPAIR  2004   LIVER SURGERY  2000   Liver resection   NM MYOCAR PERF WALL MOTION  02/16/2012   low risk study.  Small area of breast attenuation   TONSILLECTOMY  1978   TRIGGER FINGER RELEASE Bilateral    several times   UPPER GASTROINTESTINAL ENDOSCOPY     US ECHOCARDIOGRAPHY  02/16/2012   mild concentric LVH, EF >55%   Patient Active Problem List   Diagnosis Date Noted   Lumbar stenosis with neurogenic claudication 05/29/2019   Liver  masses 11/14/2012   TRANSAMINASES, SERUM, ELEVATED 07/24/2009   EPIGASTRIC PAIN 06/13/2009   Diarrhea 05/22/2009   MELENA 04/10/2009   BURN, FIRST DEGREE, ABDOMINAL WALL 04/10/2009   DIABETES MELLITUS, TYPE II, UNCONTROLLED 08/23/2007   HYPERLIPIDEMIA 11/10/2006   MORBID OBESITY 11/10/2006   ANXIETY 11/10/2006   DEPRESSION 11/10/2006   MIGRAINE HEADACHE 11/10/2006   CARPAL TUNNEL SYNDROME 11/10/2006   HYPERTENSION 11/10/2006   ALLERGIC RHINITIS 11/10/2006   GERD 11/10/2006   IBS 11/10/2006    PCP: Celene Squibb, MD  REFERRING PROVIDER: Irving Copas, PA-C  REFERRING DIAG: [id number] (ICD-10-CM) - Pain in right hip  THERAPY DIAG:  Right buttock pain  Difficulty in walking, not elsewhere classified  Rationale for Evaluation and Treatment: Rehabilitation  ONSET DATE: summer 2021  SUBJECTIVE:   SUBJECTIVE STATEMENT:  Hip is hurting today. Can't sit down due to pain. Compliant with HEP but pain is no different.    Eval: Ms. Flythe presents today for evaluation of right posterior buttock pain. She tells me she initially began having pain after pulling weeds in the summer of 2021. She had an MRI which showed hamstring tendinopathy, and was sent for ischial bursa injections. She had  two injections which provided good relief for about 3-4 months each. She then had 2 injections with no relief. She feels this pain has limited her from rest, work, and exercise; pain has continually worsened and now goes down to her right ankle  PERTINENT HISTORY: Lumbar fusion  PAIN:  Are you having pain? Yes: NPRS scale: 10/10 Pain location: right glute pain Pain description: sharp, stabbing and goes down to right ankle Aggravating factors: sitting on it Relieving factors: none  PRECAUTIONS: None  WEIGHT BEARING RESTRICTIONS: No  FALLS:  Has patient fallen in last 6 months? No  LIVING ENVIRONMENT: Lives with: lives with their family Lives in: House/apartment Stairs: Yes: External:  2 steps; none Has following equipment at home: Single point cane and Walker - 2 wheeled  OCCUPATION: work for the school system  PLOF: Independent  PATIENT GOALS: less pain  NEXT MD VISIT: Dr. Alvan Dame 03/11/23  OBJECTIVE:   DIAGNOSTIC FINDINGS:   CLINICAL DATA:  Right-sided buttock mass.   EXAM: MRI PELVIS WITHOUT CONTRAST   TECHNIQUE: Multiplanar multisequence MR imaging of the pelvis was performed. No intravenous contrast was administered.   COMPARISON:  None.   FINDINGS: Urinary Tract: The bladder is unremarkable. No bladder mass or calculi.   Bowel: The rectum, sigmoid colon and visualized small-bowel loops are grossly normal.   Vascular/Lymphatic: No aneurysm adenopathy.   Reproductive:  The uterus and ovaries are unremarkable.   Other:  No free pelvic fluid collections, pelvic mass or adenopathy.   No subcutaneous mass is identified in the region of the right buttock area. There is diffuse but symmetric appearing fatty change involving the gluteus maximus muscles bilaterally. No muscle mass or lipoma. No muscle tear or intramuscular hematoma.   Musculoskeletal: Both hips are normally located. Mild degenerative changes bilaterally but no stress fracture or AVN. No hip joint effusion or periarticular fluid collections. Mild bilateral Parry tendinosis without trochanteric bursitis.   There is bilateral hamstring tendinopathy with thickening, small interstitial tears and some surrounding inflammation. No complete tear/rupture. On the left side there is edema like signal changes in the quadratus femoris between the ischium and the femur suggesting ischial femoral impingement syndrome.   IMPRESSION: 1. No soft tissue mass in the subcutaneous fat or underlying gluteus maximus muscle in the right buttock area is identified. 2. Mild bilateral hip joint degenerative changes but no stress fracture or AVN. 3. Bilateral hamstring tendinopathy with thickening,  small interstitial tears and some surrounding inflammation. No complete tear/rupture. 4. Edema like signal changes in the left quadratus femoris muscle between the ischium and the femur suggesting ischiofemoral impingement syndrome.     Electronically Signed   By: Marijo Sanes M.D.   On: 10/27/2021 13:18   PATIENT SURVEYS:  FOTO 40  COGNITION: Overall cognitive status: Within functional limits for tasks assessed     SENSATION: Burning down right leg laterally     PALPATION: Tender right hamstring especially at origin; ischial tub  LOWER EXTREMITY ROM:  Active ROM Right eval Left eval  Hip flexion AAROM 42 degrees   Hip extension    Hip abduction    Hip adduction    Hip internal rotation    Hip external rotation    Knee flexion    Knee extension    Ankle dorsiflexion    Ankle plantarflexion    Ankle inversion    Ankle eversion     (Blank rows = not tested)  LOWER EXTREMITY MMT:  MMT Right eval Left eval  Hip  flexion 4 4+  Hip extension 3+* 4+  Hip abduction    Hip adduction    Hip internal rotation    Hip external rotation    Knee flexion 3-* 4+  Knee extension 5 5  Ankle dorsiflexion 5 5  Ankle plantarflexion    Ankle inversion    Ankle eversion     (Blank rows = not tested)  FUNCTIONAL TESTS:  5 times sit to stand: 26.70  GAIT: Distance walked: 50 ft Assistive device utilized: None Level of assistance: Modified independence Comments: slow antalgic gait   TODAY'S TREATMENT:                                                                                                                              DATE: physical therapy evaluation and HEP instruction    PATIENT EDUCATION:  Education details: Patient educated on exam findings, POC, scope of PT, HEP, what to expect next visit. Person educated: Patient Education method: Explanation, Demonstration, and Handouts Education comprehension: verbalized understanding, returned demonstration,  verbal cues required, and tactile cues required   HOME EXERCISE PROGRAM: Access Code: [email or web address] URL: [email or web address]  02/18/23 - Static Prone on Elbows  - 3 x daily - 7 x weekly - 1 sets - 1 reps - 1-2 minute hold - Prone Press Up  - 3 x daily - 7 x weekly - 3 sets - 10 reps - Supine Transversus Abdominis Bracing - Hands on Stomach  - 3 x daily - 7 x weekly - 2 sets - 10 reps - 5 second hold - Standing Lumbar Extension  - 3 x daily - 7 x weekly - 2 sets - 10 reps  Date: 02/09/2023 Prepared by: [facility]  Exercises - Supine Isometric Hamstring Set  - 2 x daily - 7 x weekly - 1 sets - 10 reps - 5 sec hold - Hooklying Hamstring Stretch with Strap  - 1 x daily - 7 x weekly - 3 sets - 10 reps  ASSESSMENT:  CLINICAL IMPRESSION: Patient describing sx more consistent with radiculopathy. Upon chart review, discovered patient has had lumbar surgery previously for radicular problems. Assesses response to lumbar extension based stretching. Progressed from prone on elbows to prone press ups which abolished 10/10 pain. Educated patient on purpose and function of today's activity and educated on anatomy of lumbar spine and nerve paths. Introduced core strengthening with ab bracing. Patient cued on proper activation. Updated HEP and issued handout. Patient will continue to benefit from skilled therapy services to reduce remaining deficits and improve functional ability.     OBJECTIVE IMPAIRMENTS: Abnormal gait, decreased activity tolerance, decreased endurance, decreased mobility, difficulty walking, decreased ROM, decreased strength, hypomobility, increased fascial restrictions, impaired perceived functional ability, impaired flexibility, and pain.   ACTIVITY LIMITATIONS: carrying, lifting, bending, sitting, standing, squatting, sleeping, stairs, transfers, bed mobility, hygiene/grooming, locomotion level, and caring for others  PARTICIPATION LIMITATIONS: meal prep, cleaning,  laundry, driving, shopping, community activity, occupation,  yard work, and school  PERSONAL FACTORS: Time since onset of injury/illness/exacerbation are also affecting patient's functional outcome.   REHAB POTENTIAL: Good  CLINICAL DECISION MAKING: Stable/uncomplicated  EVALUATION COMPLEXITY: Low   GOALS: Goals reviewed with patient? No  SHORT TERM GOALS: Target date: 03/02/2023 patient will be independent with initial HEP  Baseline: Goal status: INITIAL  2.  Patient will self report 30% improvement to improve tolerance for functional activity  Baseline:  Goal status: INITIAL    LONG TERM GOALS: Target date: 04  Patient will be independent in self management strategies to improve quality of life and functional outcomes.   Baseline:  Goal status: INITIAL  2.  Patient will self report 50% improvement to improve tolerance for functional activity  Baseline:  Goal status: INITIAL  3.  Patient will improve FOTO score to predicted value  Baseline: 40 Goal status: INITIAL  4.  Patient will be able to sit x 30 min without pain > 4/10 to improve tolerance for sitting to perform school tasks/ work tasks Baseline: 8/10 Goal status: INITIAL  5.  Patient will increase  right hip to 4+- 5/5 without pain  > 4/10 to promote return to ambulation community distances with minimal deviation.  Baseline: see above Goal status: INITIAL    PLAN:  PT FREQUENCY: 1x/week  PT DURATION: 6 weeks  PLANNED INTERVENTIONS: Therapeutic exercises, Therapeutic activity, Neuromuscular re-education, Balance training, Gait training, Patient/Family education, Joint manipulation, Joint mobilization, Stair training, Orthotic/Fit training, DME instructions, Aquatic Therapy, Dry Needling, Electrical stimulation, Spinal manipulation, Spinal mobilization, Cryotherapy, Moist heat, Compression bandaging, scar mobilization, Splintting, Taping, Traction, Ultrasound, Ionotophoresis '4mg'$ /ml Dexamethasone, and  Manual therapy   PLAN FOR NEXT SESSION: F/U on extension based exercises. Progress right hip strengthening; stretching as able  5:01 PM, 02/18/23 Josue Hector PT DPT  Physical Therapist with [facility]  [phone]

## 2023-02-25 ENCOUNTER — Encounter (HOSPITAL_COMMUNITY): Payer: BC Managed Care – PPO | Admitting: Physical Therapy

## 2023-03-04 ENCOUNTER — Encounter (HOSPITAL_COMMUNITY): Payer: BC Managed Care – PPO | Admitting: Physical Therapy

## 2023-03-15 ENCOUNTER — Other Ambulatory Visit: Payer: Self-pay | Admitting: Physician Assistant

## 2023-03-15 DIAGNOSIS — M5412 Radiculopathy, cervical region: Secondary | ICD-10-CM

## 2023-03-18 ENCOUNTER — Ambulatory Visit (HOSPITAL_COMMUNITY): Payer: BC Managed Care – PPO | Attending: Internal Medicine | Admitting: Physical Therapy

## 2023-03-18 DIAGNOSIS — M7918 Myalgia, other site: Secondary | ICD-10-CM | POA: Diagnosis not present

## 2023-03-18 DIAGNOSIS — R262 Difficulty in walking, not elsewhere classified: Secondary | ICD-10-CM | POA: Insufficient documentation

## 2023-03-18 NOTE — Therapy (Signed)
OUTPATIENT PHYSICAL THERAPY LOWER EXTREMITY TREATMENT   Patient Name: Tamara Solomon MRN: [id number] DOB:10-07-1971, 52 y.o., female Today's Date: 03/18/2023  END OF SESSION:  PT End of Session - 03/18/23 1521     Visit Number 3    Number of Visits 6    Date for PT Re-Evaluation 03/23/23    Authorization Type BCBS state health plan    Authorization Time Period no auth required    PT Start Time 1520    PT Stop Time 1558    PT Time Calculation (min) 38 min    Activity Tolerance Patient tolerated treatment well    Behavior During Therapy WFL for tasks assessed/performed             Past Medical History:  Diagnosis Date   Anxiety    Arthritis    lumbar stenosis   Complication of anesthesia    Depression    Diabetes mellitus    Family history of adverse reaction to anesthesia    family history of N&V   GERD (gastroesophageal reflux disease)    Hyperlipidemia    Hypertension    Irritable bowel syndrome    PONV (postoperative nausea and vomiting)    Past Surgical History:  Procedure Laterality Date   CARPAL TUNNEL RELEASE     Patient has had three times - 2 times right hand, one time to the left   CERVICAL CONE BIOPSY  2009   CHOLECYSTECTOMY  1997   COLONOSCOPY     COLONOSCOPY WITH PROPOFOL N/A 07/16/2022   Procedure: COLONOSCOPY WITH PROPOFOL;  Surgeon: Dolores Frame, MD;  Location: [facility];  Service: Gastroenterology;  Laterality: N/A;  1055 ASA 2   DILATION AND CURETTAGE OF UTERUS  2009   HERNIA REPAIR  2004   LIVER SURGERY  2000   Liver resection   NM MYOCAR PERF WALL MOTION  02/16/2012   low risk study.  Small area of breast attenuation   TONSILLECTOMY  1978   TRIGGER FINGER RELEASE Bilateral    several times   UPPER GASTROINTESTINAL ENDOSCOPY     US ECHOCARDIOGRAPHY  02/16/2012   mild concentric LVH, EF >55%   Patient Active Problem List   Diagnosis Date Noted   Lumbar stenosis with neurogenic claudication 05/29/2019   Liver  masses 11/14/2012   TRANSAMINASES, SERUM, ELEVATED 07/24/2009   EPIGASTRIC PAIN 06/13/2009   Diarrhea 05/22/2009   MELENA 04/10/2009   BURN, FIRST DEGREE, ABDOMINAL WALL 04/10/2009   DIABETES MELLITUS, TYPE II, UNCONTROLLED 08/23/2007   HYPERLIPIDEMIA 11/10/2006   MORBID OBESITY 11/10/2006   ANXIETY 11/10/2006   DEPRESSION 11/10/2006   MIGRAINE HEADACHE 11/10/2006   CARPAL TUNNEL SYNDROME 11/10/2006   HYPERTENSION 11/10/2006   ALLERGIC RHINITIS 11/10/2006   GERD 11/10/2006   IBS 11/10/2006    PCP: Benita Stabile, MD  REFERRING PROVIDER: Cassandria Anger, PA-C  REFERRING DIAG: V29.191 (ICD-10-CM) - Pain in right hip  THERAPY DIAG:  Right buttock pain  Difficulty in walking, not elsewhere classified  Rationale for Evaluation and Treatment: Rehabilitation  ONSET DATE: summer 2021  SUBJECTIVE:   SUBJECTIVE STATEMENT:  Has kept up with HEP. Lumbar extension exercise has been very helpful. Reports at least 85% improvement since last visit.    Eval: Ms. Mccallon presents today for evaluation of right posterior buttock pain. She tells me she initially began having pain after pulling weeds in the summer of 2021. She had an MRI which showed hamstring tendinopathy, and was sent for ischial bursa injections.  She had two injections which provided good relief for about 3-4 months each. She then had 2 injections with no relief. She feels this pain has limited her from rest, work, and exercise; pain has continually worsened and now goes down to her right ankle  PERTINENT HISTORY: Lumbar fusion  PAIN:  Are you having pain? Yes: NPRS scale: 2/10 Pain location: right glute pain Pain description: sharp, stabbing and goes down to right ankle Aggravating factors: sitting on it Relieving factors: none  PRECAUTIONS: None  WEIGHT BEARING RESTRICTIONS: No  FALLS:  Has patient fallen in last 6 months? No  LIVING ENVIRONMENT: Lives with: lives with their family Lives in:  House/apartment Stairs: Yes: External: 2 steps; none Has following equipment at home: Single point cane and Walker - 2 wheeled  OCCUPATION: work for the school system  PLOF: Independent  PATIENT GOALS: less pain  NEXT MD VISIT: Dr. Charlann Boxer 03/11/23  OBJECTIVE:   DIAGNOSTIC FINDINGS:   CLINICAL DATA:  Right-sided buttock mass.   EXAM: MRI PELVIS WITHOUT CONTRAST   TECHNIQUE: Multiplanar multisequence MR imaging of the pelvis was performed. No intravenous contrast was administered.   COMPARISON:  None.   FINDINGS: Urinary Tract: The bladder is unremarkable. No bladder mass or calculi.   Bowel: The rectum, sigmoid colon and visualized small-bowel loops are grossly normal.   Vascular/Lymphatic: No aneurysm adenopathy.   Reproductive:  The uterus and ovaries are unremarkable.   Other:  No free pelvic fluid collections, pelvic mass or adenopathy.   No subcutaneous mass is identified in the region of the right buttock area. There is diffuse but symmetric appearing fatty change involving the gluteus maximus muscles bilaterally. No muscle mass or lipoma. No muscle tear or intramuscular hematoma.   Musculoskeletal: Both hips are normally located. Mild degenerative changes bilaterally but no stress fracture or AVN. No hip joint effusion or periarticular fluid collections. Mild bilateral Parry tendinosis without trochanteric bursitis.   There is bilateral hamstring tendinopathy with thickening, small interstitial tears and some surrounding inflammation. No complete tear/rupture. On the left side there is edema like signal changes in the quadratus femoris between the ischium and the femur suggesting ischial femoral impingement syndrome.   IMPRESSION: 1. No soft tissue mass in the subcutaneous fat or underlying gluteus maximus muscle in the right buttock area is identified. 2. Mild bilateral hip joint degenerative changes but no stress fracture or AVN. 3. Bilateral hamstring  tendinopathy with thickening, small interstitial tears and some surrounding inflammation. No complete tear/rupture. 4. Edema like signal changes in the left quadratus femoris muscle between the ischium and the femur suggesting ischiofemoral impingement syndrome.     Electronically Signed   By: Rudie Meyer M.D.   On: 10/27/2021 13:18   PATIENT SURVEYS:  FOTO 40  COGNITION: Overall cognitive status: Within functional limits for tasks assessed     SENSATION: Burning down right leg laterally     PALPATION: Tender right hamstring especially at origin; ischial tub  LOWER EXTREMITY ROM:  Active ROM Right eval Left eval  Hip flexion AAROM 42 degrees   Hip extension    Hip abduction    Hip adduction    Hip internal rotation    Hip external rotation    Knee flexion    Knee extension    Ankle dorsiflexion    Ankle plantarflexion    Ankle inversion    Ankle eversion     (Blank rows = not tested)  LOWER EXTREMITY MMT:  MMT Right eval Left eval  Hip flexion 4 4+  Hip extension 3+* 4+  Hip abduction    Hip adduction    Hip internal rotation    Hip external rotation    Knee flexion 3-* 4+  Knee extension 5 5  Ankle dorsiflexion 5 5  Ankle plantarflexion    Ankle inversion    Ankle eversion     (Blank rows = not tested)  FUNCTIONAL TESTS:  5 times sit to stand: 26.70  GAIT: Distance walked: 50 ft Assistive device utilized: None Level of assistance: Modified independence Comments: slow antalgic gait   TODAY'S TREATMENT:                                                                                                                              DATE:  03/18/23 Prone on elbow 3 min  PPU x10  Ab brace 10 x 5" Ab march x 20 SLR with ab brace Bridge   02/18/23 Prone on elbow PPU Ab brace   physical therapy evaluation and HEP instruction    PATIENT EDUCATION:  Education details: Patient educated on exam findings, POC, scope of PT, HEP, what to  expect next visit. Person educated: Patient Education method: Explanation, Demonstration, and Handouts Education comprehension: verbalized understanding, returned demonstration, verbal cues required, and tactile cues required   HOME EXERCISE PROGRAM: Access Code: [email or web address] URL: [email or web address] 03/18/23 - Supine March  - 2 x daily - 7 x weekly - 2 sets - 10 reps - Small Range Straight Leg Raise  - 2 x daily - 7 x weekly - 2 sets - 10 reps - Supine Bridge  - 2 x daily - 7 x weekly - 2 sets - 10 reps  02/18/23 - Static Prone on Elbows  - 3 x daily - 7 x weekly - 1 sets - 1 reps - 1-2 minute hold - Prone Press Up  - 3 x daily - 7 x weekly - 3 sets - 10 reps - Supine Transversus Abdominis Bracing - Hands on Stomach  - 3 x daily - 7 x weekly - 2 sets - 10 reps - 5 second hold - Standing Lumbar Extension  - 3 x daily - 7 x weekly - 2 sets - 10 reps  Date: 02/09/2023 Prepared by: [facility]  Exercises - Supine Isometric Hamstring Set  - 2 x daily - 7 x weekly - 1 sets - 10 reps - 5 sec hold - Hooklying Hamstring Stretch with Strap  - 1 x daily - 7 x weekly - 3 sets - 10 reps  ASSESSMENT:  CLINICAL IMPRESSION: Patient tolerated session well today. Showing good results with lumbar extension based stretching. Progressed core strengthening exercise as shown above. Patient educated on purpose and function of all added exercise. Updated HEP and issued handout. Patient will continue to benefit from skilled therapy services to reduce remaining deficits and improve functional ability.    OBJECTIVE IMPAIRMENTS: Abnormal gait, decreased activity tolerance, decreased endurance, decreased  mobility, difficulty walking, decreased ROM, decreased strength, hypomobility, increased fascial restrictions, impaired perceived functional ability, impaired flexibility, and pain.   ACTIVITY LIMITATIONS: carrying, lifting, bending, sitting, standing, squatting, sleeping, stairs, transfers, bed  mobility, hygiene/grooming, locomotion level, and caring for others  PARTICIPATION LIMITATIONS: meal prep, cleaning, laundry, driving, shopping, community activity, occupation, yard work, and school  PERSONAL FACTORS: Time since onset of injury/illness/exacerbation are also affecting patient's functional outcome.   REHAB POTENTIAL: Good  CLINICAL DECISION MAKING: Stable/uncomplicated  EVALUATION COMPLEXITY: Low   GOALS: Goals reviewed with patient? No  SHORT TERM GOALS: Target date: 03/02/2023 patient will be independent with initial HEP  Baseline: Goal status: INITIAL  2.  Patient will self report 30% improvement to improve tolerance for functional activity  Baseline:  Goal status: INITIAL    LONG TERM GOALS: Target date: 04  Patient will be independent in self management strategies to improve quality of life and functional outcomes.   Baseline:  Goal status: INITIAL  2.  Patient will self report 50% improvement to improve tolerance for functional activity  Baseline:  Goal status: INITIAL  3.  Patient will improve FOTO score to predicted value  Baseline: 40 Goal status: INITIAL  4.  Patient will be able to sit x 30 min without pain > 4/10 to improve tolerance for sitting to perform school tasks/ work tasks Baseline: 8/10 Goal status: INITIAL  5.  Patient will increase  right hip to 4+- 5/5 without pain  > 4/10 to promote return to ambulation community distances with minimal deviation.  Baseline: see above Goal status: INITIAL    PLAN:  PT FREQUENCY: 1x/week  PT DURATION: 6 weeks  PLANNED INTERVENTIONS: Therapeutic exercises, Therapeutic activity, Neuromuscular re-education, Balance training, Gait training, Patient/Family education, Joint manipulation, Joint mobilization, Stair training, Orthotic/Fit training, DME instructions, Aquatic Therapy, Dry Needling, Electrical stimulation, Spinal manipulation, Spinal mobilization, Cryotherapy, Moist heat,  Compression bandaging, scar mobilization, Splintting, Taping, Traction, Ultrasound, Ionotophoresis 4mg /ml Dexamethasone, and Manual therapy   PLAN FOR NEXT SESSION: F/U on extension based exercises. Progress right hip strengthening; stretching as able  4:00 PM, 03/18/23 Georges Lynch PT DPT  Physical Therapist with [facility]  [phone]

## 2023-03-25 ENCOUNTER — Encounter (HOSPITAL_COMMUNITY): Payer: Self-pay

## 2023-03-25 ENCOUNTER — Encounter (HOSPITAL_COMMUNITY): Payer: BC Managed Care – PPO | Admitting: Physical Therapy

## 2023-03-25 NOTE — Therapy (Signed)
[facility] Outpatient Rehabilitation at [facility] [address] [place], [place], [address] Phone: [phone]   Fax:  [phone]  Patient Details  Name: Tamara Solomon MRN: [id number] Date of Birth: 1971/11/08 Referring Provider:  No ref. provider found  Encounter Date: 03/25/2023   PHYSICAL THERAPY DISCHARGE SUMMARY  Visits from Start of Care: 3  Current functional level related to goals / functional outcomes: unknown   Remaining deficits: unknown   Education / Equipment: unknown   Patient agrees to discharge. Patient goals were not met. Patient is being discharged due to the patient's request.    10:58 AM, 03/25/23 Cooper Stamp Small [place] MPT  physical therapy [place] [phone]  [facility] Outpatient Rehabilitation at [facility] [address] [place], [place], [address] Phone: [phone]   Fax:  [phone]

## 2023-03-26 ENCOUNTER — Encounter (HOSPITAL_COMMUNITY): Payer: BC Managed Care – PPO

## 2023-04-01 ENCOUNTER — Encounter (HOSPITAL_COMMUNITY): Payer: BC Managed Care – PPO | Admitting: Physical Therapy

## 2023-04-03 ENCOUNTER — Ambulatory Visit
Admission: RE | Admit: 2023-04-03 | Discharge: 2023-04-03 | Disposition: A | Discharge status: Home / Self Care | Payer: BC Managed Care – PPO | Source: Ambulatory Visit | Attending: Physician Assistant | Admitting: Physician Assistant

## 2023-04-03 DIAGNOSIS — M5412 Radiculopathy, cervical region: Secondary | ICD-10-CM

## 2023-04-16 ENCOUNTER — Other Ambulatory Visit: Payer: Self-pay | Admitting: Neurological Surgery

## 2023-04-23 NOTE — Progress Notes (Signed)
Surgical Instructions    Your procedure is scheduled on Tuesday, 05/04/23.  Report to [facility] Main Entrance "A" at 5:30 A.M., then check in with the Admitting office.  Call this number if you have problems the morning of surgery:  [phone]   If you have any questions prior to your surgery date call [phone]: Open Monday-Friday 8am-4pm If you experience any cold or flu symptoms such as cough, fever, chills, shortness of breath, etc. between now and your scheduled surgery, please notify us at the above number     Remember:  Do not eat after midnight the night before your surgery  You may drink clear liquids until 4:30am the morning of your surgery.   Clear liquids allowed are: Water, Non-Citrus Juices (without pulp), Carbonated Beverages, Clear Tea, Black Coffee ONLY (NO MILK, CREAM OR POWDERED CREAMER of any kind), and Gatorade    Take these medicines the morning of surgery with A SIP OF WATER:  clonazePAM (KLONOPIN)  DULoxetine (CYMBALTA)  gabapentin (NEURONTIN)  methocarbamol (ROBAXIN)  pantoprazole (PROTONIX)  rosuvastatin (CRESTOR)   IF NEEDED: promethazine (PHENERGAN)    As of today, STOP taking any Aspirin (unless otherwise instructed by your surgeon) Aleve, Naproxen, Ibuprofen, Motrin, Advil, Goody's, BC's, all herbal medications, fish oil, and all vitamins. This includes diclofenac (VOLTAREN) tablet and gel.   WHAT DO I DO ABOUT MY DIABETES MEDICATION?   Do not take oral diabetes medicines (pills) the morning of surgery.  Do not take empagliflozin (JARDIANCE) 72 hours prior to surgery. Your last dose will be 04/30/23.     THE MORNING OF SURGERY, take 80 units of Insulin Degludec (TRESIBA FLEXTOUCH). Do not take sitaGLIPtin (JANUVIA).  The day of surgery, do not take other diabetes injectables, including Byetta (exenatide), Bydureon (exenatide ER), Victoza (liraglutide), or Trulicity (dulaglutide).  If your CBG is greater than 220 mg/dL, you may take  of  your sliding scale (Novolog) dose of insulin.   HOW TO MANAGE YOUR DIABETES BEFORE AND AFTER SURGERY  Why is it important to control my blood sugar before and after surgery? Improving blood sugar levels before and after surgery helps healing and can limit problems. A way of improving blood sugar control is eating a healthy diet by:  Eating less sugar and carbohydrates  Increasing activity/exercise  Talking with your doctor about reaching your blood sugar goals High blood sugars (greater than 180 mg/dL) can raise your risk of infections and slow your recovery, so you will need to focus on controlling your diabetes during the weeks before surgery. Make sure that the doctor who takes care of your diabetes knows about your planned surgery including the date and location.  How do I manage my blood sugar before surgery? Check your blood sugar at least 4 times a day, starting 2 days before surgery, to make sure that the level is not too high or low.  Check your blood sugar the morning of your surgery when you wake up and every 2 hours until you get to the Short Stay unit.  If your blood sugar is less than 70 mg/dL, you will need to treat for low blood sugar: Do not take insulin. Treat a low blood sugar (less than 70 mg/dL) with  cup of clear juice (cranberry or apple), 4 glucose tablets, OR glucose gel. Recheck blood sugar in 15 minutes after treatment (to make sure it is greater than 70 mg/dL). If your blood sugar is not greater than 70 mg/dL on recheck, call [phone] for further instructions.  Report your blood sugar to the short stay nurse when you get to Short Stay.  If you are admitted to the hospital after surgery: Your blood sugar will be checked by the staff and you will probably be given insulin after surgery (instead of oral diabetes medicines) to make sure you have good blood sugar levels. The goal for blood sugar control after surgery is 80-180 mg/dL.            Do not wear  jewelry or makeup. Do not wear lotions, powders, perfumes or deodorant. Do not shave 48 hours prior to surgery.   Do not bring valuables to the hospital. Do not wear nail polish, gel polish, artificial nails, or any other type of covering on natural nails (fingers and toes) If you have artificial nails or gel coating that need to be removed by a nail salon, please have this removed prior to surgery. Artificial nails or gel coating may interfere with anesthesia's ability to adequately monitor your vital signs.  [facility] is not responsible for any belongings or valuables.    Do NOT Smoke (Tobacco/Vaping)  24 hours prior to your procedure  If you use a CPAP at night, you may bring your mask for your overnight stay.   Contacts, glasses, hearing aids, dentures or partials may not be worn into surgery, please bring cases for these belongings   For patients admitted to the hospital, discharge time will be determined by your treatment team.   Patients discharged the day of surgery will not be allowed to drive home, and someone needs to stay with them for 24 hours.   SURGICAL WAITING ROOM VISITATION Patients having surgery or a procedure may have no more than 2 support people in the waiting area - these visitors may rotate.   Children under the age of 106 must have an adult with them who is not the patient. If the patient needs to stay at the hospital during part of their recovery, the visitor guidelines for inpatient rooms apply. Pre-op nurse will coordinate an appropriate time for 1 support person to accompany patient in pre-op.  This support person may not rotate.   Please refer to [email or web address] for the visitor guidelines for Inpatients (after your surgery is over and you are in a regular room).     Pre-operative 5 CHG Bath Instructions   You can play a key role in reducing the risk of infection after surgery. Your skin needs to be  as free of germs as possible. You can reduce the number of germs on your skin by washing with CHG (chlorhexidine gluconate) soap before surgery. CHG is an antiseptic soap that kills germs and continues to kill germs even after washing.   DO NOT use if you have an allergy to chlorhexidine/CHG or antibacterial soaps. If your skin becomes reddened or irritated, stop using the CHG and notify one of our RNs at [phone].   Please shower with the CHG soap starting 4 days before surgery using the following schedule:     Please keep in mind the following:  DO NOT shave, including legs and underarms, starting the day of your first shower.   You may shave your face at any point before/day of surgery.  Place clean sheets on your bed the day you start using CHG soap. Use a clean washcloth (not used since being washed) for each shower. DO NOT sleep with pets once you start using the CHG.   CHG Shower Instructions:  If you choose  to wash your hair and private area, wash first with your normal shampoo/soap.  After you use shampoo/soap, rinse your hair and body thoroughly to remove shampoo/soap residue.  Turn the water OFF and apply about 3 tablespoons (45 ml) of CHG soap to a CLEAN washcloth.  Apply CHG soap ONLY FROM YOUR NECK DOWN TO YOUR TOES (washing for 3-5 minutes)  DO NOT use CHG soap on face, private areas, open wounds, or sores.  Pay special attention to the area where your surgery is being performed.  If you are having back surgery, having someone wash your back for you may be helpful. Wait 2 minutes after CHG soap is applied, then you may rinse off the CHG soap.  Pat dry with a clean towel  Put on clean clothes/pajamas   If you choose to wear lotion, please use ONLY the CHG-compatible lotions on the back of this paper.   Additional instructions for the day of surgery: DO NOT APPLY any lotions, deodorants, cologne, or perfumes.   Do not bring valuables to the hospital.  Do not wear nail  polish, gel polish, artificial nails, or any other type of covering on natural nails (fingers and toes) Do not wear jewelry or makeup Put on clean/comfortable clothes.  Please brush your teeth.  Ask your nurse before applying any prescription medications to the skin.     CHG Compatible Lotions   Aveeno Moisturizing lotion  Cetaphil Moisturizing Cream  Cetaphil Moisturizing Lotion  Clairol Herbal Essence Moisturizing Lotion, Dry Skin  Clairol Herbal Essence Moisturizing Lotion, Extra Dry Skin  Clairol Herbal Essence Moisturizing Lotion, Normal Skin  Curel Age Defying Therapeutic Moisturizing Lotion with Alpha Hydroxy  Curel Extreme Care Body Lotion  Curel Soothing Hands Moisturizing Hand Lotion  Curel Therapeutic Moisturizing Cream, Fragrance-Free  Curel Therapeutic Moisturizing Lotion, Fragrance-Free  Curel Therapeutic Moisturizing Lotion, Original Formula  Eucerin Daily Replenishing Lotion  Eucerin Dry Skin Therapy Plus Alpha Hydroxy Crme  Eucerin Dry Skin Therapy Plus Alpha Hydroxy Lotion  Eucerin Original Crme  Eucerin Original Lotion  Eucerin Plus Crme Eucerin Plus Lotion  Eucerin TriLipid Replenishing Lotion  Keri Anti-Bacterial Hand Lotion  Keri Deep Conditioning Original Lotion Dry Skin Formula Softly Scented  Keri Deep Conditioning Original Lotion, Fragrance Free Sensitive Skin Formula  Keri Lotion Fast Absorbing Fragrance Free Sensitive Skin Formula  Keri Lotion Fast Absorbing Softly Scented Dry Skin Formula  Keri Original Lotion  Keri Skin Renewal Lotion Keri Silky Smooth Lotion  Keri Silky Smooth Sensitive Skin Lotion  Nivea Body Creamy Conditioning Oil  Nivea Body Extra Enriched Lotion  Nivea Body Original Lotion  Nivea Body Sheer Moisturizing Lotion Nivea Crme  Nivea Skin Firming Lotion  NutraDerm 30 Skin Lotion  NutraDerm Skin Lotion  NutraDerm Therapeutic Skin Cream  NutraDerm Therapeutic Skin Lotion  ProShield Protective Hand Cream  Provon  moisturizing lotion    If you received a COVID test during your pre-op visit, it is requested that you wear a mask when out in public, stay away from anyone that may not be feeling well, and notify your surgeon if you develop symptoms. If you have been in contact with anyone that has tested positive in the last 10 days, please notify your surgeon.    Please read over the following fact sheets that you were given.

## 2023-04-26 ENCOUNTER — Encounter (HOSPITAL_COMMUNITY)
Admission: RE | Admit: 2023-04-26 | Discharge: 2023-04-26 | Disposition: A | Discharge status: Home / Self Care | Payer: BC Managed Care – PPO | Source: Ambulatory Visit | Attending: Neurological Surgery | Admitting: Neurological Surgery

## 2023-04-26 ENCOUNTER — Encounter (HOSPITAL_COMMUNITY): Payer: Self-pay

## 2023-04-26 ENCOUNTER — Other Ambulatory Visit: Payer: Self-pay

## 2023-04-26 VITALS — BP 117/79 | HR 99 | Temp 98.7°F | Resp 18 | Ht 63.0 in | Wt 243.7 lb

## 2023-04-26 DIAGNOSIS — K760 Fatty (change of) liver, not elsewhere classified: Secondary | ICD-10-CM | POA: Insufficient documentation

## 2023-04-26 DIAGNOSIS — M4802 Spinal stenosis, cervical region: Secondary | ICD-10-CM | POA: Diagnosis not present

## 2023-04-26 DIAGNOSIS — R160 Hepatomegaly, not elsewhere classified: Secondary | ICD-10-CM | POA: Insufficient documentation

## 2023-04-26 DIAGNOSIS — E119 Type 2 diabetes mellitus without complications: Secondary | ICD-10-CM | POA: Diagnosis not present

## 2023-04-26 DIAGNOSIS — Z01818 Encounter for other preprocedural examination: Secondary | ICD-10-CM | POA: Diagnosis present

## 2023-04-26 DIAGNOSIS — I10 Essential (primary) hypertension: Secondary | ICD-10-CM | POA: Insufficient documentation

## 2023-04-26 DIAGNOSIS — K219 Gastro-esophageal reflux disease without esophagitis: Secondary | ICD-10-CM | POA: Diagnosis not present

## 2023-04-26 DIAGNOSIS — E785 Hyperlipidemia, unspecified: Secondary | ICD-10-CM | POA: Diagnosis not present

## 2023-04-26 DIAGNOSIS — K769 Liver disease, unspecified: Secondary | ICD-10-CM | POA: Diagnosis not present

## 2023-04-26 DIAGNOSIS — Z794 Long term (current) use of insulin: Secondary | ICD-10-CM | POA: Insufficient documentation

## 2023-04-26 DIAGNOSIS — K589 Irritable bowel syndrome without diarrhea: Secondary | ICD-10-CM | POA: Diagnosis not present

## 2023-04-26 LAB — CBC
HCT: 42.4 % (ref 36.0–46.0)
Hemoglobin: 13.6 g/dL (ref 12.0–15.0)
MCH: 29.3 pg (ref 26.0–34.0)
MCHC: 32.1 g/dL (ref 30.0–36.0)
MCV: 91.4 fL (ref 80.0–100.0)
Platelets: 250 K/uL (ref 150–400)
RBC: 4.64 MIL/uL (ref 3.87–5.11)
RDW: 12.6 % (ref 11.5–15.5)
WBC: 7.2 K/uL (ref 4.0–10.5)
nRBC: 0 % (ref 0.0–0.2)

## 2023-04-26 LAB — SURGICAL PCR SCREEN
MRSA, PCR: POSITIVE — AB
Staphylococcus aureus: POSITIVE — AB

## 2023-04-26 LAB — COMPREHENSIVE METABOLIC PANEL WITH GFR
ALT: 19 U/L (ref 0–44)
AST: 21 U/L (ref 15–41)
Albumin: 3.8 g/dL (ref 3.5–5.0)
Alkaline Phosphatase: 67 U/L (ref 38–126)
Anion gap: 7 (ref 5–15)
BUN: 15 mg/dL (ref 6–20)
CO2: 27 mmol/L (ref 22–32)
Calcium: 9.5 mg/dL (ref 8.9–10.3)
Chloride: 102 mmol/L (ref 98–111)
Creatinine, Ser: 1.06 mg/dL — ABNORMAL HIGH (ref 0.44–1.00)
GFR, Estimated: >60 mL/min
Glucose, Bld: 148 mg/dL — ABNORMAL HIGH (ref 70–99)
Potassium: 3.6 mmol/L (ref 3.5–5.1)
Sodium: 136 mmol/L (ref 135–145)
Total Bilirubin: 0.8 mg/dL (ref 0.3–1.2)
Total Protein: 6.5 g/dL (ref 6.5–8.1)

## 2023-04-26 LAB — TYPE AND SCREEN
ABO/RH(D): O NEG
Antibody Screen: NEGATIVE

## 2023-04-26 LAB — GLUCOSE, CAPILLARY: Glucose-Capillary: 157 mg/dL — ABNORMAL HIGH (ref 70–99)

## 2023-04-26 NOTE — Progress Notes (Signed)
PCP - Dr. Nita Sells Cardiologist - Denies  PPM/ICD - Denies Device Orders - n/a Rep Notified - n/a  Chest x-ray - 11/23/2022 EKG - 04/26/2023 Stress Test - 11/27/2012 ECHO - 02/16/2012 Cardiac Cath - Denies  Sleep Study - Denies CPAP - n/a  Pt is DM2. She wears a Libre CGM on left arm at pre-op appointment. Normal fasting CBG 96-104. Last A1c was 8.1 03/12/2023. CBG at pre-op appointment 157. Pt had a tomato sandwich and water for lunch around 1330.   Last dose of GLP1 agonist-  n/a GLP1 instructions: n/a  Blood Thinner Instructions: n/a Aspirin Instructions: n/a  ERAS Protcol - Clear liquids until 0430 morning of surgery PRE-SURGERY Ensure or G2- n/a  COVID TEST- n/a   Anesthesia review: Yes. Hx of HTN and DM with last A1c 8.1 (abnormal per protocol)   Patient denies shortness of breath, fever, cough and chest pain at PAT appointment. Pt denies any respiratory illness/infection in the last two months.   All instructions explained to the patient, with a verbal understanding of the material. Patient agrees to go over the instructions while at home for a better understanding. Patient also instructed to self quarantine after being tested for COVID-19. The opportunity to ask questions was provided.

## 2023-04-27 NOTE — Progress Notes (Signed)
Shanda Bumps, [place] scheduler with Dr. Laretta Alstrom office, made aware of PCR results. 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 Shanda Bumps said she would met surgeon know.

## 2023-04-27 NOTE — Anesthesia Preprocedure Evaluation (Addendum)
Anesthesia Evaluation  Patient identified by MRN, date of birth, ID band Patient awake    Reviewed: Allergy & Precautions, NPO status , Patient's Chart, lab work & pertinent test results  Airway Mallampati: II       Dental no notable dental hx.    Pulmonary    Pulmonary exam normal        Cardiovascular hypertension, Pt. on medications Normal cardiovascular exam     Neuro/Psych  Headaches PSYCHIATRIC DISORDERS Anxiety Depression       GI/Hepatic ,GERD  Medicated,,  Endo/Other  diabetes, Type 2, Insulin Dependent, Oral Hypoglycemic Agents  Morbid obesity  Renal/GU   negative genitourinary   Musculoskeletal   Abdominal  (+) + obese  Peds  Hematology negative hematology ROS (+)   Anesthesia Other Findings   Reproductive/Obstetrics                             Anesthesia Physical Anesthesia Plan  ASA: 3  Anesthesia Plan: General   Post-op Pain Management:    Induction:   PONV Risk Score and Plan:   Airway Management Planned: Oral ETT  Additional Equipment: None, Arterial line and ClearSight  Intra-op Plan:   Post-operative Plan: Extubation in OR  Informed Consent: I have reviewed the patients History and Physical, chart, labs and discussed the procedure including the risks, benefits and alternatives for the proposed anesthesia with the patient or authorized representative who has indicated his/her understanding and acceptance.     Dental advisory given  Plan Discussed with: CRNA  Anesthesia Plan Comments: (PAT note written 04/27/2023 by Shonna Chock, PA-C.  2PIV + A-Line)       Anesthesia Quick Evaluation

## 2023-04-27 NOTE — Progress Notes (Signed)
Anesthesia Chart Review:  Case: [id number] Date/Time: 05/04/23 0715   Procedure: C4 to T1 Anterior cervial discectomy and fusion - RM 21 3C   Anesthesia type: General   Pre-op diagnosis: Spinal Stenosis, Cervical region   Location: [facility] OR ROOM 20 / [facility] OR   Surgeons: Jadene Pierini, MD       DISCUSSION: Patient is a 52 year old female scheduled for the above procedure.   History includes never smoker, post-operative N/V, DM2, HTN, GERD, HLD, IBS, liver surgery (not otherwise specified, 2000; benign appearing hepatic masses, mild hepatic steatosis 12/2018 MRI), spinal surgery (L5-S1 PLIF 05/29/19). BMI is consistent with morbid obesity.   A1c 8.1% on 03/11/23 [facility]).  He wears a Libre CGM on her left arm.  Reported typical fasting glucose around 96-104. She is on Januvia 100 mg daily, Tresiba 200 units/mL 160 units Q AM, Novolog 30 units BID, Jardiance 25 mg daily (to hold 72 hours prior to surgery).    She denied shortness of breath, cough, fever, chest pain or recent respiratory illness at her PAT RN visit.  EKG showed normal sinus rhythm, low voltage.    Anesthesia team to evaluate on the day of surgery. She will need a CBG and urine pregnancy test on arrival.    VS: BP 117/79   Pulse 99   Temp 37.1 C   Resp 18   Ht 5\' 3"  (1.6 m)   Wt 110.5 kg   LMP  (Within Months) Comment: 3 months ago  SpO2 99%   BMI 43.17 kg/m    PROVIDERS: Benita Stabile, MD is PCP    LABS: Preoperative labs noted.  (all labs ordered are listed, but only abnormal results are displayed)  Labs Reviewed  SURGICAL PCR SCREEN - Abnormal; Notable for the following components:      Result Value   MRSA, PCR POSITIVE (*)    Staphylococcus aureus POSITIVE (*)    All other components within normal limits  GLUCOSE, CAPILLARY - Abnormal; Notable for the following components:   Glucose-Capillary 157 (*)    All other components within normal limits  COMPREHENSIVE METABOLIC PANEL - Abnormal;  Notable for the following components:   Glucose, Bld 148 (*)    Creatinine, Ser 1.06 (*)    All other components within normal limits  CBC  TYPE AND SCREEN    IMAGES: MRI C-spine 04/03/23: IMPRESSION: Markedly limited exam due to the degree of motion artifact. Within this limitation- 1. Severe spinal canal stenosis at C5-C6 and C6-C7. Findings have progressed compared to 2006. 2. Moderate spinal canal narrowing at C4-C5, progressed compared to 2006   CXR 11/23/22: FINDINGS: The heart size and mediastinal contours are within normal limits. Both lungs are clear. The visualized skeletal structures are unremarkable. Surgical clips are identified in the upper abdomen is stable. IMPRESSION: No active cardiopulmonary disease.    EKG: EKG 04/26/23: Normal sinus rhythm Low voltage QRS Borderline ECG When compared with ECG of 25-May-2019 10:48, PREVIOUS ECG IS PRESENT No significant change since last tracing Confirmed by York Pellant [phone]) on 04/26/2023 9:07:40 PM   CV: Echo 02/16/12 (former [facility]): Summary: Technically difficult study. There is mild concentric LVH. Left ventricular systolic function is normal.  Ejection fraction > 55%. The transmitral septal Doppler flow pattern is normal for age. The left atrial size is normal.   Right ventricular systolic pressure is normal. No valvular disease noted.   Nuclear stress test 02/16/12 (former [facility]): Impression: Small area of breast  attenuation artifact.  No reversible ischemia.  Post stress EF 82%.  No significant wall motion abnormalities noted.  No Lexiscan EKG changes.  Nondiagnostic for ischemia.  This is a low risk scan.    Past Medical History:  Diagnosis Date   Anxiety    Arthritis    lumbar stenosis   Complication of anesthesia    Depression    Diabetes mellitus    Family history of adverse reaction to anesthesia    family history of N&V   GERD (gastroesophageal reflux disease)     Hyperlipidemia    Hypertension    Irritable bowel syndrome    PONV (postoperative nausea and vomiting)     Past Surgical History:  Procedure Laterality Date   CARPAL TUNNEL RELEASE     Patient has had three times - 2 times right hand, one time to the left   CERVICAL CONE BIOPSY  2009   CHOLECYSTECTOMY  1997   COLONOSCOPY     COLONOSCOPY WITH PROPOFOL N/A 07/16/2022   Procedure: COLONOSCOPY WITH PROPOFOL;  Surgeon: Dolores Frame, MD;  Location: [facility];  Service: Gastroenterology;  Laterality: N/A;  1055 ASA 2   DILATION AND CURETTAGE OF UTERUS  2009   HERNIA REPAIR  2004   Incisional Hernia from Liver Resection   LIVER SURGERY  2000   Liver resection   NM MYOCAR PERF WALL MOTION  02/16/2012   low risk study.  Small area of breast attenuation   TONSILLECTOMY  1978   TRIGGER FINGER RELEASE Bilateral    several times   UPPER GASTROINTESTINAL ENDOSCOPY     US ECHOCARDIOGRAPHY  02/16/2012   mild concentric LVH, EF >55%    MEDICATIONS:  ACCU-CHEK AVIVA PLUS test strip   ASHWAGANDHA PO   BLACK CURRANT SEED OIL PO   Cholecalciferol (VITAMIN D-3 PO)   Choline Fenofibrate (TRILIPIX) 135 MG capsule   CINNAMON PO   clonazePAM (KLONOPIN) 0.5 MG tablet   Collagen-Vitamin C (COLLAGEN PLUS VITAMIN C PO)   diclofenac (VOLTAREN) 75 MG EC tablet   diclofenac sodium (VOLTAREN) 1 % GEL   DULoxetine (CYMBALTA) 60 MG capsule   empagliflozin (JARDIANCE) 25 MG TABS tablet   gabapentin (NEURONTIN) 400 MG capsule   Ginger, Zingiber officinalis, (GINGER ROOT) 550 MG CAPS   insulin aspart (NOVOLOG FLEXPEN) 100 UNIT/ML FlexPen   Insulin Degludec (TRESIBA FLEXTOUCH) 200 UNIT/ML SOPN   levocetirizine (XYZAL) 5 MG tablet   lisinopril-hydrochlorothiazide (PRINZIDE,ZESTORETIC) 20-12.5 MG per tablet   Magnesium Oxide -Mg Supplement 250 MG TABS   methocarbamol (ROBAXIN) 750 MG tablet   pantoprazole (PROTONIX) 40 MG tablet   Probiotic Product (PROBIOTIC DIGESTIVE SUPP PO)    promethazine (PHENERGAN) 25 MG tablet   rosuvastatin (CRESTOR) 40 MG tablet   sitaGLIPtin (JANUVIA) 100 MG tablet   Tart Cherry 1200 MG CAPS   Turmeric 500 MG CAPS   No current facility-administered medications for this encounter.    Shonna Chock, PA-C Surgical Short Stay/Anesthesiology [facility] Phone [phone] [facility] Phone [phone] 04/27/2023 11:44 AM

## 2023-05-04 ENCOUNTER — Inpatient Hospital Stay (HOSPITAL_COMMUNITY): Payer: BC Managed Care – PPO | Admitting: Vascular Surgery

## 2023-05-04 ENCOUNTER — Inpatient Hospital Stay (HOSPITAL_COMMUNITY): Payer: BC Managed Care – PPO | Admitting: Anesthesiology

## 2023-05-04 ENCOUNTER — Encounter (HOSPITAL_COMMUNITY)
Admission: RE | Disposition: A | Discharge status: Home / Self Care | Payer: Self-pay | Source: Home / Self Care | Attending: Neurological Surgery

## 2023-05-04 ENCOUNTER — Inpatient Hospital Stay (HOSPITAL_COMMUNITY): Payer: BC Managed Care – PPO

## 2023-05-04 ENCOUNTER — Encounter (HOSPITAL_COMMUNITY): Payer: Self-pay | Admitting: Neurological Surgery

## 2023-05-04 ENCOUNTER — Inpatient Hospital Stay (HOSPITAL_COMMUNITY)
Admission: RE | Admit: 2023-05-04 | Discharge: 2023-05-05 | DRG: 473 | Disposition: A | Discharge status: Home / Self Care | Payer: BC Managed Care – PPO | Attending: Neurological Surgery | Admitting: Neurological Surgery

## 2023-05-04 DIAGNOSIS — E119 Type 2 diabetes mellitus without complications: Secondary | ICD-10-CM | POA: Diagnosis present

## 2023-05-04 DIAGNOSIS — K219 Gastro-esophageal reflux disease without esophagitis: Secondary | ICD-10-CM | POA: Diagnosis present

## 2023-05-04 DIAGNOSIS — I10 Essential (primary) hypertension: Secondary | ICD-10-CM | POA: Diagnosis present

## 2023-05-04 DIAGNOSIS — M4802 Spinal stenosis, cervical region: Principal | ICD-10-CM | POA: Diagnosis present

## 2023-05-04 DIAGNOSIS — E785 Hyperlipidemia, unspecified: Secondary | ICD-10-CM | POA: Diagnosis present

## 2023-05-04 DIAGNOSIS — M2578 Osteophyte, vertebrae: Secondary | ICD-10-CM | POA: Diagnosis present

## 2023-05-04 DIAGNOSIS — R1310 Dysphagia, unspecified: Secondary | ICD-10-CM | POA: Diagnosis present

## 2023-05-04 DIAGNOSIS — M5412 Radiculopathy, cervical region: Principal | ICD-10-CM | POA: Diagnosis present

## 2023-05-04 DIAGNOSIS — Z800 Family history of malignant neoplasm of digestive organs: Secondary | ICD-10-CM | POA: Diagnosis not present

## 2023-05-04 DIAGNOSIS — Z833 Family history of diabetes mellitus: Secondary | ICD-10-CM

## 2023-05-04 DIAGNOSIS — Z01818 Encounter for other preprocedural examination: Secondary | ICD-10-CM

## 2023-05-04 HISTORY — PX: ANTERIOR CERVICAL DECOMPRESSION/DISCECTOMY FUSION 4 LEVELS: SHX5556

## 2023-05-04 LAB — GLUCOSE, CAPILLARY
Glucose-Capillary: 207 mg/dL — ABNORMAL HIGH (ref 70–99)
Glucose-Capillary: 208 mg/dL — ABNORMAL HIGH (ref 70–99)
Glucose-Capillary: 236 mg/dL — ABNORMAL HIGH (ref 70–99)

## 2023-05-04 LAB — POCT PREGNANCY, URINE: Preg Test, Ur: NEGATIVE

## 2023-05-04 SURGERY — ANTERIOR CERVICAL DECOMPRESSION/DISCECTOMY FUSION 4 LEVELS
Anesthesia: General | Site: Neck

## 2023-05-04 MED ORDER — ACETAMINOPHEN 10 MG/ML IV SOLN
INTRAVENOUS | Status: DC | PRN
  Administered 2023-05-04: 1000 mg via INTRAVENOUS

## 2023-05-04 MED ORDER — CHLORHEXIDINE GLUCONATE CLOTH 2 % EX PADS: 6.0000 | MEDICATED_PAD | Freq: Once | CUTANEOUS | Status: DC

## 2023-05-04 MED ORDER — GABAPENTIN 400 MG PO CAPS
400.0000 mg | ORAL_CAPSULE | Freq: Three times a day (TID) | ORAL | Status: DC
  Administered 2023-05-04 – 2023-05-05 (×2): 400 mg via ORAL
  Filled 2023-05-04 (×2): qty 1

## 2023-05-04 MED ORDER — ACETAMINOPHEN 325 MG PO TABS
650.0000 mg | ORAL_TABLET | ORAL | Status: DC | PRN
  Administered 2023-05-04: 650 mg via ORAL
  Filled 2023-05-04: qty 2

## 2023-05-04 MED ORDER — DOCUSATE SODIUM 100 MG PO CAPS
100.0000 mg | ORAL_CAPSULE | Freq: Two times a day (BID) | ORAL | Status: DC
  Administered 2023-05-04 – 2023-05-05 (×2): 100 mg via ORAL
  Filled 2023-05-04 (×2): qty 1

## 2023-05-04 MED ORDER — THROMBIN 5000 UNITS EX SOLR
CUTANEOUS | Status: AC
  Filled 2023-05-04: qty 5000

## 2023-05-04 MED ORDER — ROSUVASTATIN CALCIUM 20 MG PO TABS
40.0000 mg | ORAL_TABLET | Freq: Every morning | ORAL | Status: DC
  Administered 2023-05-05: 40 mg via ORAL
  Filled 2023-05-04: qty 2

## 2023-05-04 MED ORDER — SCOPOLAMINE 1 MG/3DAYS TD PT72
MEDICATED_PATCH | TRANSDERMAL | Status: AC
  Filled 2023-05-04: qty 1

## 2023-05-04 MED ORDER — FENTANYL CITRATE (PF) 250 MCG/5ML IJ SOLN
INTRAMUSCULAR | Status: DC | PRN
  Administered 2023-05-04 (×2): 50 ug via INTRAVENOUS
  Administered 2023-05-04 (×2): 25 ug via INTRAVENOUS
  Administered 2023-05-04 (×2): 50 ug via INTRAVENOUS

## 2023-05-04 MED ORDER — INSULIN ASPART 100 UNIT/ML IJ SOLN
0.0000 [IU] | Freq: Three times a day (TID) | INTRAMUSCULAR | Status: DC
  Administered 2023-05-04: 8 [IU] via SUBCUTANEOUS
  Administered 2023-05-05: 3 [IU] via SUBCUTANEOUS

## 2023-05-04 MED ORDER — ONDANSETRON HCL 4 MG PO TABS: 4.0000 mg | ORAL_TABLET | Freq: Four times a day (QID) | ORAL | Status: DC | PRN

## 2023-05-04 MED ORDER — MEPERIDINE HCL 25 MG/ML IJ SOLN: 6.2500 mg | INTRAMUSCULAR | Status: DC | PRN

## 2023-05-04 MED ORDER — SODIUM CHLORIDE 0.9 % IV SOLN
250.0000 mL | INTRAVENOUS | Status: DC
  Administered 2023-05-04: 250 mL via INTRAVENOUS

## 2023-05-04 MED ORDER — PANTOPRAZOLE SODIUM 40 MG PO TBEC
40.0000 mg | DELAYED_RELEASE_TABLET | Freq: Two times a day (BID) | ORAL | Status: DC
  Administered 2023-05-04 – 2023-05-05 (×2): 40 mg via ORAL
  Filled 2023-05-04 (×2): qty 1

## 2023-05-04 MED ORDER — AMISULPRIDE (ANTIEMETIC) 5 MG/2ML IV SOLN
10.0000 mg | Freq: Once | INTRAVENOUS | Status: AC
  Administered 2023-05-04: 10 mg via INTRAVENOUS

## 2023-05-04 MED ORDER — MIDAZOLAM HCL 2 MG/2ML IJ SOLN
INTRAMUSCULAR | Status: DC | PRN
  Administered 2023-05-04: 2 mg via INTRAVENOUS

## 2023-05-04 MED ORDER — INSULIN ASPART 100 UNIT/ML IJ SOLN: 30.0000 [IU] | Freq: Two times a day (BID) | INTRAMUSCULAR | Status: DC

## 2023-05-04 MED ORDER — THROMBIN 5000 UNITS EX SOLR: OROMUCOSAL | Status: DC | PRN

## 2023-05-04 MED ORDER — METHOCARBAMOL 750 MG PO TABS
750.0000 mg | ORAL_TABLET | Freq: Three times a day (TID) | ORAL | Status: DC | PRN
  Administered 2023-05-04 – 2023-05-05 (×2): 750 mg via ORAL
  Filled 2023-05-04 (×2): qty 1

## 2023-05-04 MED ORDER — SODIUM CHLORIDE 0.9% FLUSH
3.0000 mL | Freq: Two times a day (BID) | INTRAVENOUS | Status: DC
  Administered 2023-05-04: 3 mL via INTRAVENOUS

## 2023-05-04 MED ORDER — SUCCINYLCHOLINE CHLORIDE 200 MG/10ML IV SOSY
PREFILLED_SYRINGE | INTRAVENOUS | Status: DC | PRN
  Administered 2023-05-04: 200 mg via INTRAVENOUS

## 2023-05-04 MED ORDER — DULOXETINE HCL 30 MG PO CPEP
60.0000 mg | ORAL_CAPSULE | Freq: Every morning | ORAL | Status: DC
  Administered 2023-05-05: 60 mg via ORAL
  Filled 2023-05-04: qty 2

## 2023-05-04 MED ORDER — OXYCODONE HCL 5 MG PO TABS: 10.0000 mg | ORAL_TABLET | ORAL | Status: DC | PRN

## 2023-05-04 MED ORDER — DEXMEDETOMIDINE HCL IN NACL 80 MCG/20ML IV SOLN
INTRAVENOUS | Status: DC | PRN
  Administered 2023-05-04: 20 ug via INTRAVENOUS

## 2023-05-04 MED ORDER — HYDROMORPHONE HCL 1 MG/ML IJ SOLN
INTRAMUSCULAR | Status: AC
  Filled 2023-05-04: qty 0.5

## 2023-05-04 MED ORDER — AMISULPRIDE (ANTIEMETIC) 5 MG/2ML IV SOLN
INTRAVENOUS | Status: AC
  Filled 2023-05-04: qty 4

## 2023-05-04 MED ORDER — LACTATED RINGERS IV SOLN: INTRAVENOUS | Status: DC

## 2023-05-04 MED ORDER — POLYETHYLENE GLYCOL 3350 17 G PO PACK: 17.0000 g | PACK | Freq: Every day | ORAL | Status: DC | PRN

## 2023-05-04 MED ORDER — METOCLOPRAMIDE HCL 5 MG/ML IJ SOLN
INTRAMUSCULAR | Status: AC
  Filled 2023-05-04: qty 2

## 2023-05-04 MED ORDER — HYDROMORPHONE HCL 2 MG PO TABS
2.0000 mg | ORAL_TABLET | ORAL | Status: DC | PRN
  Administered 2023-05-05 (×2): 4 mg via ORAL
  Filled 2023-05-04 (×2): qty 2

## 2023-05-04 MED ORDER — ACETAMINOPHEN 10 MG/ML IV SOLN
INTRAVENOUS | Status: AC
  Filled 2023-05-04: qty 100

## 2023-05-04 MED ORDER — PHENYLEPHRINE HCL-NACL 20-0.9 MG/250ML-% IV SOLN
INTRAVENOUS | Status: DC | PRN
  Administered 2023-05-04: 40 ug/min via INTRAVENOUS

## 2023-05-04 MED ORDER — ORAL CARE MOUTH RINSE: 15.0000 mL | Freq: Once | OROMUCOSAL | Status: DC

## 2023-05-04 MED ORDER — HYDROMORPHONE HCL 2 MG PO TABS
2.0000 mg | ORAL_TABLET | ORAL | Status: DC | PRN
  Administered 2023-05-04: 2 mg via ORAL
  Filled 2023-05-04: qty 1

## 2023-05-04 MED ORDER — FENOFIBRATE 160 MG PO TABS
160.0000 mg | ORAL_TABLET | Freq: Every day | ORAL | Status: DC
  Administered 2023-05-04 – 2023-05-05 (×2): 160 mg via ORAL
  Filled 2023-05-04 (×2): qty 1

## 2023-05-04 MED ORDER — PROMETHAZINE HCL 25 MG/ML IJ SOLN: 6.2500 mg | INTRAMUSCULAR | Status: DC | PRN

## 2023-05-04 MED ORDER — DEXAMETHASONE SODIUM PHOSPHATE 10 MG/ML IJ SOLN
INTRAMUSCULAR | Status: DC | PRN
  Administered 2023-05-04: 10 mg via INTRAVENOUS

## 2023-05-04 MED ORDER — HYDROMORPHONE HCL 1 MG/ML IJ SOLN: 0.2500 mg | INTRAMUSCULAR | Status: DC | PRN

## 2023-05-04 MED ORDER — LORATADINE 10 MG PO TABS
10.0000 mg | ORAL_TABLET | Freq: Every evening | ORAL | Status: DC
  Administered 2023-05-04: 10 mg via ORAL
  Filled 2023-05-04: qty 1

## 2023-05-04 MED ORDER — PROPOFOL 10 MG/ML IV BOLUS
INTRAVENOUS | Status: DC | PRN
  Administered 2023-05-04: 25 ug/kg/min via INTRAVENOUS
  Administered 2023-05-04: 50 mg via INTRAVENOUS
  Administered 2023-05-04: 200 mg via INTRAVENOUS

## 2023-05-04 MED ORDER — LISINOPRIL 20 MG PO TABS
20.0000 mg | ORAL_TABLET | Freq: Every day | ORAL | Status: DC
  Administered 2023-05-05: 20 mg via ORAL
  Filled 2023-05-04: qty 1

## 2023-05-04 MED ORDER — ONDANSETRON HCL 4 MG/2ML IJ SOLN
INTRAMUSCULAR | Status: DC | PRN
  Administered 2023-05-04: 4 mg via INTRAVENOUS

## 2023-05-04 MED ORDER — HYDROMORPHONE HCL 1 MG/ML IJ SOLN
1.0000 mg | INTRAMUSCULAR | Status: DC | PRN
  Administered 2023-05-04: 1 mg via INTRAVENOUS
  Filled 2023-05-04: qty 1

## 2023-05-04 MED ORDER — ACETAMINOPHEN 10 MG/ML IV SOLN: 1000.0000 mg | Freq: Once | INTRAVENOUS | Status: DC | PRN

## 2023-05-04 MED ORDER — ONDANSETRON HCL 4 MG/2ML IJ SOLN: 4.0000 mg | Freq: Four times a day (QID) | INTRAMUSCULAR | Status: DC | PRN

## 2023-05-04 MED ORDER — LIDOCAINE-EPINEPHRINE 1 %-1:100000 IJ SOLN
INTRAMUSCULAR | Status: AC
  Filled 2023-05-04: qty 1

## 2023-05-04 MED ORDER — METOCLOPRAMIDE HCL 5 MG/ML IJ SOLN
INTRAMUSCULAR | Status: DC | PRN
  Administered 2023-05-04: 10 mg via INTRAVENOUS

## 2023-05-04 MED ORDER — ROCURONIUM BROMIDE 10 MG/ML (PF) SYRINGE
PREFILLED_SYRINGE | INTRAVENOUS | Status: AC
  Filled 2023-05-04: qty 10

## 2023-05-04 MED ORDER — LISINOPRIL-HYDROCHLOROTHIAZIDE 20-12.5 MG PO TABS: 1.0000 | ORAL_TABLET | Freq: Every morning | ORAL | Status: DC

## 2023-05-04 MED ORDER — MIDAZOLAM HCL 2 MG/2ML IJ SOLN
INTRAMUSCULAR | Status: AC
  Filled 2023-05-04: qty 2

## 2023-05-04 MED ORDER — CHLORHEXIDINE GLUCONATE 0.12 % MT SOLN
15.0000 mL | Freq: Once | OROMUCOSAL | Status: DC
  Filled 2023-05-04: qty 15

## 2023-05-04 MED ORDER — OXYCODONE HCL 5 MG PO TABS
5.0000 mg | ORAL_TABLET | ORAL | Status: DC | PRN
  Administered 2023-05-04: 5 mg via ORAL
  Filled 2023-05-04: qty 1

## 2023-05-04 MED ORDER — ACETAMINOPHEN 650 MG RE SUPP: 650.0000 mg | RECTAL | Status: DC | PRN

## 2023-05-04 MED ORDER — FENTANYL CITRATE (PF) 250 MCG/5ML IJ SOLN
INTRAMUSCULAR | Status: AC
  Filled 2023-05-04: qty 5

## 2023-05-04 MED ORDER — CEFAZOLIN SODIUM-DEXTROSE 2-4 GM/100ML-% IV SOLN
2.0000 g | Freq: Three times a day (TID) | INTRAVENOUS | Status: AC
  Administered 2023-05-04 – 2023-05-05 (×2): 2 g via INTRAVENOUS
  Filled 2023-05-04 (×2): qty 100

## 2023-05-04 MED ORDER — PROMETHAZINE HCL 25 MG PO TABS: 25.0000 mg | ORAL_TABLET | Freq: Three times a day (TID) | ORAL | Status: DC | PRN

## 2023-05-04 MED ORDER — SUGAMMADEX SODIUM 200 MG/2ML IV SOLN
INTRAVENOUS | Status: DC | PRN
  Administered 2023-05-04: 400 mg via INTRAVENOUS

## 2023-05-04 MED ORDER — DEXMEDETOMIDINE HCL IN NACL 80 MCG/20ML IV SOLN
INTRAVENOUS | Status: AC
  Filled 2023-05-04: qty 20

## 2023-05-04 MED ORDER — LIDOCAINE-EPINEPHRINE 1 %-1:100000 IJ SOLN
INTRAMUSCULAR | Status: DC | PRN
  Administered 2023-05-04: 10 mL

## 2023-05-04 MED ORDER — PHENOL 1.4 % MT LIQD
1.0000 | OROMUCOSAL | Status: DC | PRN
  Administered 2023-05-04: 1 via OROMUCOSAL
  Filled 2023-05-04: qty 177

## 2023-05-04 MED ORDER — 0.9 % SODIUM CHLORIDE (POUR BTL) OPTIME
TOPICAL | Status: DC | PRN
  Administered 2023-05-04: 1000 mL

## 2023-05-04 MED ORDER — SUCCINYLCHOLINE CHLORIDE 200 MG/10ML IV SOSY
PREFILLED_SYRINGE | INTRAVENOUS | Status: AC
  Filled 2023-05-04: qty 10

## 2023-05-04 MED ORDER — LIDOCAINE 2% (20 MG/ML) 5 ML SYRINGE
INTRAMUSCULAR | Status: DC | PRN
  Administered 2023-05-04: 100 mg via INTRAVENOUS

## 2023-05-04 MED ORDER — SODIUM CHLORIDE 0.9% FLUSH: 3.0000 mL | INTRAVENOUS | Status: DC | PRN

## 2023-05-04 MED ORDER — DEXAMETHASONE SODIUM PHOSPHATE 10 MG/ML IJ SOLN
INTRAMUSCULAR | Status: AC
  Filled 2023-05-04: qty 1

## 2023-05-04 MED ORDER — PROPOFOL 1000 MG/100ML IV EMUL
INTRAVENOUS | Status: AC
  Filled 2023-05-04: qty 100

## 2023-05-04 MED ORDER — CLONAZEPAM 0.5 MG PO TABS
0.5000 mg | ORAL_TABLET | Freq: Every day | ORAL | Status: DC
  Administered 2023-05-05: 0.5 mg via ORAL
  Filled 2023-05-04: qty 1

## 2023-05-04 MED ORDER — LIDOCAINE HCL 1 % IJ SOLN
INTRAMUSCULAR | Status: AC
  Filled 2023-05-04: qty 20

## 2023-05-04 MED ORDER — PROPOFOL 10 MG/ML IV BOLUS
INTRAVENOUS | Status: AC
  Filled 2023-05-04: qty 20

## 2023-05-04 MED ORDER — LINAGLIPTIN 5 MG PO TABS
5.0000 mg | ORAL_TABLET | Freq: Every day | ORAL | Status: DC
  Administered 2023-05-05: 5 mg via ORAL
  Filled 2023-05-04 (×2): qty 1

## 2023-05-04 MED ORDER — LIDOCAINE 2% (20 MG/ML) 5 ML SYRINGE
INTRAMUSCULAR | Status: AC
  Filled 2023-05-04: qty 5

## 2023-05-04 MED ORDER — EMPAGLIFLOZIN 25 MG PO TABS
25.0000 mg | ORAL_TABLET | Freq: Every morning | ORAL | Status: DC
  Filled 2023-05-04: qty 1

## 2023-05-04 MED ORDER — MENTHOL 3 MG MT LOZG: 1.0000 | LOZENGE | OROMUCOSAL | Status: DC | PRN

## 2023-05-04 MED ORDER — HYDROCHLOROTHIAZIDE 12.5 MG PO TABS
12.5000 mg | ORAL_TABLET | Freq: Every day | ORAL | Status: DC
  Administered 2023-05-05: 12.5 mg via ORAL
  Filled 2023-05-04: qty 1

## 2023-05-04 MED ORDER — SCOPOLAMINE 1 MG/3DAYS TD PT72
MEDICATED_PATCH | TRANSDERMAL | Status: DC | PRN
  Administered 2023-05-04: 1 via TRANSDERMAL

## 2023-05-04 MED ORDER — CEFAZOLIN SODIUM-DEXTROSE 2-4 GM/100ML-% IV SOLN
2.0000 g | INTRAVENOUS | Status: AC
  Administered 2023-05-04: 2 g via INTRAVENOUS
  Filled 2023-05-04: qty 100

## 2023-05-04 MED ORDER — ONDANSETRON HCL 4 MG/2ML IJ SOLN
INTRAMUSCULAR | Status: AC
  Filled 2023-05-04: qty 2

## 2023-05-04 MED ORDER — HYDROMORPHONE HCL 1 MG/ML IJ SOLN
INTRAMUSCULAR | Status: DC | PRN
  Administered 2023-05-04 (×2): .5 mg via INTRAVENOUS

## 2023-05-04 MED ORDER — ROCURONIUM BROMIDE 10 MG/ML (PF) SYRINGE
PREFILLED_SYRINGE | INTRAVENOUS | Status: DC | PRN
  Administered 2023-05-04 (×5): 20 mg via INTRAVENOUS
  Administered 2023-05-04: 100 mg via INTRAVENOUS

## 2023-05-04 SURGICAL SUPPLY — 57 items
ADH SKN CLS APL DERMABOND .7 (GAUZE/BANDAGES/DRESSINGS) ×1
APL SKNCLS STERI-STRIP NONHPOA (GAUZE/BANDAGES/DRESSINGS)
BAG COUNTER SPONGE SURGICOUNT (BAG) ×1 IMPLANT
BAG SPNG CNTER NS LX DISP (BAG) ×1
BENZOIN TINCTURE PRP APPL 2/3 (GAUZE/BANDAGES/DRESSINGS) IMPLANT
BLADE CLIPPER SURG (BLADE) IMPLANT
BLADE SURG 11 STRL SS (BLADE) ×1 IMPLANT
BUR MATCHSTICK NEURO 3.0 LAGG (BURR) ×1 IMPLANT
CANISTER SUCT 3000ML PPV (MISCELLANEOUS) ×1 IMPLANT
DERMABOND ADVANCED .7 DNX12 (GAUZE/BANDAGES/DRESSINGS) ×1 IMPLANT
DRAPE C-ARM 42X72 X-RAY (DRAPES) ×2 IMPLANT
DRAPE HALF SHEET 40X57 (DRAPES) IMPLANT
DRAPE LAPAROTOMY 100X72 PEDS (DRAPES) ×1 IMPLANT
DRAPE MICROSCOPE SLANT 54X150 (MISCELLANEOUS) ×1 IMPLANT
DURAPREP 6ML APPLICATOR 50/CS (WOUND CARE) ×1 IMPLANT
ELECT COATED BLADE 2.86 ST (ELECTRODE) ×1 IMPLANT
ELECT REM PT RETURN 9FT ADLT (ELECTROSURGICAL) ×1
ELECTRODE REM PT RTRN 9FT ADLT (ELECTROSURGICAL) ×1 IMPLANT
GAUZE 4X4 16PLY ~~LOC~~+RFID DBL (SPONGE) IMPLANT
GLOVE BIO SURGEON STRL SZ7.5 (GLOVE) ×1 IMPLANT
GLOVE BIOGEL PI IND STRL 7.0 (GLOVE) ×1 IMPLANT
GLOVE BIOGEL PI IND STRL 7.5 (GLOVE) ×2 IMPLANT
GLOVE ECLIPSE 7.5 STRL STRAW (GLOVE) ×1 IMPLANT
GLOVE EXAM NITRILE LRG STRL (GLOVE) IMPLANT
GLOVE EXAM NITRILE XL STR (GLOVE) IMPLANT
GLOVE EXAM NITRILE XS STR PU (GLOVE) IMPLANT
GOWN STRL REUS W/ TWL LRG LVL3 (GOWN DISPOSABLE) ×2 IMPLANT
GOWN STRL REUS W/ TWL XL LVL3 (GOWN DISPOSABLE) IMPLANT
GOWN STRL REUS W/TWL 2XL LVL3 (GOWN DISPOSABLE) IMPLANT
GOWN STRL REUS W/TWL LRG LVL3 (GOWN DISPOSABLE) ×2
GOWN STRL REUS W/TWL XL LVL3 (GOWN DISPOSABLE)
HEMOSTAT POWDER KIT SURGIFOAM (HEMOSTASIS) ×1 IMPLANT
KIT BASIN OR (CUSTOM PROCEDURE TRAY) ×1 IMPLANT
KIT TURNOVER KIT B (KITS) ×1 IMPLANT
NDL SPNL 18GX3.5 QUINCKE PK (NEEDLE) ×1 IMPLANT
NEEDLE HYPO 22GX1.5 SAFETY (NEEDLE) ×1 IMPLANT
NEEDLE SPNL 18GX3.5 QUINCKE PK (NEEDLE) ×1 IMPLANT
NS IRRIG 1000ML POUR BTL (IV SOLUTION) ×1 IMPLANT
PACK LAMINECTOMY NEURO (CUSTOM PROCEDURE TRAY) ×1 IMPLANT
PAD ARMBOARD 7.5X6 YLW CONV (MISCELLANEOUS) ×3 IMPLANT
PIN DISTRACTION 14MM (PIN) IMPLANT
PLATE ZEVO 3LVL 59MM (Plate) IMPLANT
SCREW 13MM (Screw) IMPLANT
SET WALTER ACTIVATION W/DRAPE (SET/KITS/TRAYS/PACK) IMPLANT
SOL ELECTROSURG ANTI STICK (MISCELLANEOUS)
SOLUTION ELECTROSURG ANTI STCK (MISCELLANEOUS) ×1 IMPLANT
SPACER BONE CORNERSTONE 7X14 (Orthopedic Implant) IMPLANT
SPIKE FLUID TRANSFER (MISCELLANEOUS) ×1 IMPLANT
SPONGE INTESTINAL PEANUT (DISPOSABLE) ×1 IMPLANT
STAPLER VISISTAT 35W (STAPLE) IMPLANT
SUT MNCRL AB 3-0 PS2 18 (SUTURE) ×1 IMPLANT
SUT VIC AB 3-0 SH 8-18 (SUTURE) ×1 IMPLANT
TAPE CLOTH 3X10 TAN LF (GAUZE/BANDAGES/DRESSINGS) ×1 IMPLANT
TOWEL GREEN STERILE (TOWEL DISPOSABLE) ×1 IMPLANT
TOWEL GREEN STERILE FF (TOWEL DISPOSABLE) ×1 IMPLANT
TRAY FOLEY MTR SLVR 16FR STAT (SET/KITS/TRAYS/PACK) ×1 IMPLANT
WATER STERILE IRR 1000ML POUR (IV SOLUTION) ×1 IMPLANT

## 2023-05-04 NOTE — Anesthesia Procedure Notes (Signed)
Procedure Name: Intubation Date/Time: 05/04/2023 8:04 AM  Performed by: Brynda Peon, CRNAPre-anesthesia Checklist: Patient identified, Emergency Drugs available, Suction available, Patient being monitored and Timeout performed Patient Re-evaluated:Patient Re-evaluated prior to induction Oxygen Delivery Method: Circle system utilized Preoxygenation: Pre-oxygenation with 100% oxygen Induction Type: IV induction Laryngoscope Size: Mac, 3 and Glidescope Grade View: Grade I Tube type: Oral Tube size: 7.0 mm Number of attempts: 1 Airway Equipment and Method: Stylet and Video-laryngoscopy Placement Confirmation: ETT inserted through vocal cords under direct vision, positive ETCO2 and breath sounds checked- equal and bilateral Secured at: 21 cm Tube secured with: Tape Dental Injury: Teeth and Oropharynx as per pre-operative assessment  Comments: ETT secured to left side, tongue cleared with tongue blade.

## 2023-05-04 NOTE — Op Note (Signed)
PATIENT: Tamara Solomon  PROCEDURE DATE: 05/04/23  PRE-OPERATIVE DIAGNOSIS:  Cervical radiculopathy, cervical canal stenosis   POST-OPERATIVE DIAGNOSIS:  Same   PROCEDURE:  C4-5, C5-6, C6-C7 Anterior Cervical Discectomy and Instrumented Fusion   SURGEON:  Surgeon(s) and Role:    Jadene Pierini, MD    Emilee Hero PA   ANESTHESIA: ETGA   BRIEF HISTORY: This is a 52 year old woman who presented with bilateral upper extremity, right greater than left, radicular pain. Workup showed multi-level foraminal disease with severe canal stenosis and some cord signal change. I therefore recommended surgery in the form of ACDF. We discussed usual risks, benefits, and alternatives as well as possible inability to reach the caudal levels due to habitus.    OPERATIVE DETAIL: The patient was taken to the operating room and placed on the OR table in the supine position. A formal time out was performed with two patient identifiers and confirmed the operative site. Anesthesia was induced by the anesthesia team.  Fluoroscopy was used to localize the surgical level and an incision was marked in a skin crease. The area was then prepped and draped in a sterile fashion. A transverse linear incision was made on the left side of the neck. The platysma was divided and the sternocleidomastoid muscle was identified. The carotid sheath was palpated, identified, and retracted laterally with the sternocleidomastoid muscle. The strap muscles were identified and retracted medially and the pretracheal fascia was entered. A bent spinal needle was used with fluoroscopy to localize the surgical level after dissection. The longus colli were elevated bilaterally and a self-retaining retractor was placed. The endotracheal tube cuff balloon was deflated and reinflated after retractor placement.   Anterior osteophytes were removed until flush with the anterior vertebral body. The disc annulus was incised and a complete C4-C5  discectomy was performed. The posterior longitudinal ligament was incised followed by ligamentous and bony removal until no central canal stenosis was present. Decompression was then taken out laterally into the bilateral foramina until no foraminal stenosis was palpable. A 7mm cortical allograft (Medtronic) was inserted into the disc space as an interbody graft.   This technique was then repeated at C5-6 and C6-7. I attempted to expose C7-T1, but was unable to reach this with an appropriate or safe trajectory and therefore decided to complete the construct at C7, making this a C4-5/5-6/6-7 ACDF.   An anterior plate (Medtronic) was positioned and 8, 13mm screws were used to secure the plate to the C4, C5, C6 and C7 vertebral bodies. Hemostasis was obtained and the incision was closed in layers. All instrument and sponge counts were correct. The patient was then returned to anesthesia for emergence. No apparent complications at the completion of the procedure.  Alli Consentino PA was scrubbed and assisted with the entire procedure which included exposure, decompression, instrumentation and closure.   EBL:    DRAINS: none   SPECIMENS: none   Jadene Pierini, MD 05/04/23 12:09 PM

## 2023-05-04 NOTE — Transfer of Care (Signed)
Immediate Anesthesia Transfer of Care Note  Patient: Tamara Solomon  Procedure(s) Performed: Cervical Four to Cervical Seven Anterior Cervical Discectomy and Fusion (Neck)  Patient Location: PACU  Anesthesia Type:General  Level of Consciousness: drowsy, patient cooperative, and responds to stimulation  Airway & Oxygen Therapy: Patient Spontanous Breathing and Patient connected to face mask oxygen  Post-op Assessment: Report given to RN and Post -op Vital signs reviewed and stable  Post vital signs: Reviewed and stable  Last Vitals:  Vitals Value Taken Time  BP 155/74 05/04/23 1222  Temp    Pulse 88 05/04/23 1223  Resp 11 05/04/23 1223  SpO2 98 % 05/04/23 1223  Vitals shown include unvalidated device data.  Last Pain:  Vitals:   05/04/23 0607  PainSc: 7       Patients Stated Pain Goal: 5 (05/04/23 1610)  Complications: No notable events documented.

## 2023-05-04 NOTE — Plan of Care (Signed)

## 2023-05-04 NOTE — Progress Notes (Signed)
Attending anesthesiologist present now and stated not to place on diabetic protocol at this time.

## 2023-05-04 NOTE — H&P (Signed)
Surgical H&P Update  HPI: 52 y.o. with a history of R>L upper extremity radicular pain. Workup showed foraminal stenosis as well as canal stenosis with cord signal change. No changes in health since they were last seen. Still having the above and wishes to proceed with surgery.  PMHx:  Past Medical History:  Diagnosis Date   Anxiety    Arthritis    lumbar stenosis   Complication of anesthesia    Depression    Diabetes mellitus    Family history of adverse reaction to anesthesia    family history of N&V   GERD (gastroesophageal reflux disease)    Hyperlipidemia    Hypertension    Irritable bowel syndrome    PONV (postoperative nausea and vomiting)    FamHx:  Family History  Problem Relation Age of Onset   Healthy Mother    Liver cancer Father    Healthy Son    Diabetes Brother    SocHx:  reports that she has never smoked. She has never used smokeless tobacco. She reports that she does not drink alcohol and does not use drugs.  Physical Exam: Strength 5/5 x4 and SILTx4 except R>L C8 numbness  Assesment/Plan: 52 y.o. woman with BUE pain, multi level foraminal and central canal disease with cord signal change, here for C4-T1 ACDF. Risks, benefits, and alternatives discussed and the patient would like to continue with surgery.  -OR today -3C post-op  Jadene Pierini, MD 05/04/23 7:30 AM

## 2023-05-04 NOTE — Progress Notes (Signed)
Attending anesthesiologist not here at this time, so consulted nighttime call anesthesiologist regarding orders for blood sugar management since surgery is greater than four hours. MD stated to review CBG with attending anesthesiologist once he arrives prior to placing on diabetic protocol.

## 2023-05-05 LAB — GLUCOSE, CAPILLARY
Glucose-Capillary: 151 mg/dL — ABNORMAL HIGH (ref 70–99)
Glucose-Capillary: 164 mg/dL — ABNORMAL HIGH (ref 70–99)

## 2023-05-05 MED ORDER — HYDROMORPHONE HCL 2 MG PO TABS: 2.0000 mg | ORAL_TABLET | ORAL | 0 refills | Status: DC | PRN

## 2023-05-05 MED ORDER — HYDROMORPHONE HCL 2 MG PO TABS
2.0000 mg | ORAL_TABLET | ORAL | Status: DC | PRN
  Administered 2023-05-05: 2 mg via ORAL
  Filled 2023-05-05: qty 1

## 2023-05-05 NOTE — Progress Notes (Signed)
Neurosurgery Service Progress Note  Subjective: No acute events overnight. UE radiculopathy is improved. Ant / post cervical pain as expected. Some dysphagia, but she is able to eat and drink.    Objective: Vitals:   05/04/23 1937 05/05/23 0057 05/05/23 0623 05/05/23 0740  BP: (!) 147/75 (!) 150/75 (!) 158/74 (!) 145/67  Pulse: 77 85 88 88  Resp: 18 18 18 16   Temp: 98.2 F (36.8 C) 98 F (36.7 C) 97.6 F (36.4 C) 97.7 F (36.5 C)  TempSrc: Oral Oral Oral Oral  SpO2: 100% 98% 99% 99%  Weight:      Height:        Physical Exam: Strength 5/5 x4 and SILTx4, incision c/d/I   Assessment & Plan: 52 y.o. female s/p C4-7 ACDF, recovering well.  -PT/OT recs -discharge home today   Emilee Hero, PA-C 05/05/23 8:49 AM

## 2023-05-05 NOTE — Discharge Summary (Signed)
Discharge Summary  Date of Admission: 05/04/2023  Date of Discharge: 05/05/23  Attending Physician: Autumn Patty, MD  Hospital Course: Patient was admitted following an uncomplicated C4-7 ACDF. They were recovered in PACU and transferred to [facility]. Their preop symptoms were improved, their hospital course was uncomplicated and the patient was discharged home today. They will follow up in clinic with me in clinic in 2 weeks.  Neurologic exam at discharge:  Strength 5/5 x4 and SILTx4, incision c/d/I   Discharge diagnosis: cervical radiculopathy   Iran Sizer, PA-C 05/05/23 8:51 AM

## 2023-05-05 NOTE — Progress Notes (Signed)
Patient alert and oriented, voiding adequately, skin clean, dry and intact without evidence of skin break down, or symptoms of complications - no redness or edema noted, only slight tenderness at site.  Patient states pain is manageable at time of discharge. Patient has an appointment with MD in 2 weeks 

## 2023-05-05 NOTE — Discharge Instructions (Signed)
  Call Your Doctor If Any of These Occur Redness, drainage, or swelling at the wound.  Temperature greater than 101 degrees. Severe pain not relieved by pain medication. Increased difficulty swallowing.  Incision starts to come apart. Follow Up Appt Call  [phone]) for problems.  If you have any hardware placed in your spine, you will need an x-ray before your appointment.

## 2023-05-05 NOTE — Progress Notes (Signed)
PT Cancellation Note and Discharge  Patient Details Name: Tamara Solomon MRN: [id number] DOB: [birth year]   Cancelled Treatment:    Reason Eval/Treat Not Completed: PT screened, no needs identified, will sign off. Discussed pt case with OT who reports pt is currently mobilizing without assistance and does not require a formal PT evaluation at this time. PT signing off. If needs change, please reconsult.     Marylynn Pearson 05/05/2023, 9:20 AM  Conni Slipper, PT, DPT Acute Rehabilitation Services Secure Chat Preferred Office: [phone]

## 2023-05-05 NOTE — Evaluation (Signed)
Occupational Therapy Evaluation Patient Details Name: Tamara Solomon MRN: [id number] DOB: [birth year] Today's Date: 05/05/2023   History of Present Illness Tamara Solomon is a 52 yo female whp underwent C4-5, C5-6, C6-C7 Anterior Cervical Discectomy and Instrumented Fusion 5/28. PMHx: anxiety, arthritis, depression, DM, HLD, HTN   Clinical Impression   [name] was evaluated s/p the above spine surgery. She is indep without DME and works as a Runner, broadcasting/film/video at baseline. Upon evaluation she was limited surgical pain, limited BUE over head ROM, decreased activity tolerance and knowledge of cervical precautions. Overall she needed supervision A for all functional transfers and mobility without AD. Provided cues and education on spinal precautions and compensatory techniques throughout, handout provided and pt demonstrated great recall during ADLs with supervision A only. Pt does not require further acute OT services. Recommend d/c home with support of family.        Recommendations for follow up therapy are one component of a multi-disciplinary discharge planning process, led by the attending physician.  Recommendations may be updated based on patient status, additional functional criteria and insurance authorization.   Assistance Recommended at Discharge Intermittent Supervision/Assistance  Patient can return home with the following A little help with walking and/or transfers;A little help with bathing/dressing/bathroom;Assistance with cooking/housework;Assistance with feeding;Direct supervision/assist for medications management;Direct supervision/assist for financial management;Assist for transportation;Help with stairs or ramp for entrance    Functional Status Assessment  Patient has had a recent decline in their functional status and demonstrates the ability to make significant improvements in function in a reasonable and predictable amount of time.  Equipment Recommendations  None recommended  by OT       Precautions / Restrictions Precautions Precautions: Fall;Cervical Precaution Booklet Issued: Yes (comment) Restrictions Weight Bearing Restrictions: No      Mobility Bed Mobility Overal bed mobility: Needs Assistance Bed Mobility: Rolling, Sidelying to Sit Rolling: Supervision Sidelying to sit: Supervision       General bed mobility comments: pt plans to sleep in recliner    Transfers Overall transfer level: Needs assistance Equipment used: Rolling walker (2 wheels) Transfers: Sit to/from Stand Sit to Stand: Supervision           General transfer comment: slow and guarded      Balance Overall balance assessment: Needs assistance Sitting-balance support: Feet supported Sitting balance-Leahy Scale: Good     Standing balance support: No upper extremity supported, During functional activity Standing balance-Leahy Scale: Fair                             ADL either performed or assessed with clinical judgement   ADL Overall ADL's : Needs assistance/impaired Eating/Feeding: Independent;Sitting   Grooming: Supervision/safety;Standing   Upper Body Bathing: Set up;Sitting   Lower Body Bathing: Supervison/ safety;Sit to/from stand   Upper Body Dressing : Set up;Sitting   Lower Body Dressing: Supervision/safety;Sit to/from stand   Toilet Transfer: Supervision/safety;Ambulation   Toileting- Clothing Manipulation and Hygiene: Supervision/safety;Sitting/lateral lean       Functional mobility during ADLs: Supervision/safety General ADL Comments: no AD. cues for back precautions with good carry over     Vision Baseline Vision/History: 0 No visual deficits Vision Assessment?: No apparent visual deficits     Perception Perception Perception Tested?: No   Praxis Praxis Praxis tested?: Not tested      Hand Dominance Right   Extremity/Trunk Assessment Upper Extremity Assessment Upper Extremity Assessment: Overall WFL for tasks  assessed (limited over head ROM  due to pain)   Lower Extremity Assessment Lower Extremity Assessment: Overall WFL for tasks assessed   Cervical / Trunk Assessment Cervical / Trunk Assessment: Neck Surgery   Communication Communication Communication: No difficulties   Cognition Arousal/Alertness: Awake/alert Behavior During Therapy: WFL for tasks assessed/performed Overall Cognitive Status: Within Functional Limits for tasks assessed                                 General Comments: anxious in anticipation of pain     General Comments  VSS on RA, son present     Home Living Family/patient expects to be discharged to:: Private residence Living Arrangements: Spouse/significant other;Other relatives (grandchildren) Available Help at Discharge: Family;Available 24 hours/day Type of Home: House Home Access: Stairs to enter [employer] of Steps: 2 Entrance Stairs-Rails: Right Home Layout: One level     Bathroom Shower/Tub: Producer, television/film/video: Standard     Home Equipment: Agricultural consultant (2 wheels);Shower seat          Prior Functioning/Environment Prior Level of Function : Independent/Modified Independent             Mobility Comments: no AD ADLs Comments: indep, is a Runner, broadcasting/film/video - off for the summer        OT Problem List: Decreased strength;Decreased range of motion;Decreased activity tolerance;Impaired balance (sitting and/or standing);Decreased knowledge of precautions;Pain         OT Goals(Current goals can be found in the care plan section) Acute Rehab OT Goals Patient Stated Goal: home OT Goal Formulation: With patient Time For Goal Achievement: 05/05/23 Potential to Achieve Goals: Good      AM-PAC OT "6 Clicks" Daily Activity     Outcome Measure Help from another person eating meals?: None Help from another person taking care of personal grooming?: A Little Help from another person toileting, which includes  using toliet, bedpan, or urinal?: A Little Help from another person bathing (including washing, rinsing, drying)?: A Little Help from another person to put on and taking off regular upper body clothing?: None Help from another person to put on and taking off regular lower body clothing?: A Little 6 Click Score: 20   End of Session Nurse Communication: Mobility status  Activity Tolerance: Patient tolerated treatment well Patient left: in bed;with call bell/phone within reach;with family/visitor present  OT Visit Diagnosis: Unsteadiness on feet (R26.81);Other abnormalities of gait and mobility (R26.89);Muscle weakness (generalized) (M62.81);Pain                Time: 2956-2130 OT Time Calculation (min): 17 min Charges:  OT General Charges $OT Visit: 1 Visit OT Evaluation $OT Eval Low Complexity: 1 Low  Derenda Mis, OTR/L Acute Rehabilitation Services Office [phone] Secure Chat Communication Preferred   Donia Pounds 05/05/2023, 9:35 AM

## 2023-05-06 ENCOUNTER — Encounter (HOSPITAL_COMMUNITY): Payer: Self-pay | Admitting: Neurological Surgery

## 2023-05-10 NOTE — Anesthesia Postprocedure Evaluation (Signed)
Anesthesia Post Note  Patient: Tamara Solomon  Procedure(s) Performed: Cervical Four to Cervical Seven Anterior Cervical Discectomy and Fusion (Neck)     Patient location during evaluation: PACU Anesthesia Type: General Level of consciousness: sedated Pain management: pain level controlled Vital Signs Assessment: post-procedure vital signs reviewed and stable Respiratory status: spontaneous breathing Cardiovascular status: stable Postop Assessment: no headache and no backache Anesthetic complications: yes (PONV)  No notable events documented.  Last Vitals:  Vitals:   05/05/23 0623 05/05/23 0740  BP: (!) 158/74 (!) 145/67  Pulse: 88 88  Resp: 18 16  Temp: 36.4 C 36.5 C  SpO2: 99% 99%    Last Pain:  Vitals:   05/05/23 0930  TempSrc:   PainSc: 4                  John F Jillian Pianka Jr

## 2023-06-23 ENCOUNTER — Ambulatory Visit (HOSPITAL_COMMUNITY): Payer: BC Managed Care – PPO | Attending: Internal Medicine | Admitting: Physical Therapy

## 2023-06-23 ENCOUNTER — Encounter (HOSPITAL_COMMUNITY): Payer: Self-pay | Admitting: Physical Therapy

## 2023-06-23 DIAGNOSIS — M542 Cervicalgia: Secondary | ICD-10-CM | POA: Insufficient documentation

## 2023-06-23 DIAGNOSIS — S1190XA Unspecified open wound of unspecified part of neck, initial encounter: Secondary | ICD-10-CM | POA: Insufficient documentation

## 2023-06-23 NOTE — Therapy (Signed)
OUTPATIENT PHYSICAL THERAPY Wound EVALUATION   Patient Name: Tamara Solomon MRN: [id number] DOB:Feb 21, 1971, 52 y.o., female Today's Date: 06/23/2023   PCP: Benita Stabile, MD REFERRING PROVIDER: Benita Stabile, MD  END OF SESSION:  PT End of Session - 06/23/23 1429     Visit Number 1    Number of Visits 8    Date for PT Re-Evaluation 07/21/23    Authorization Type BCBS state health plan    Authorization Time Period no auth required    PT Start Time 1430    PT Stop Time 1455    PT Time Calculation (min) 25 min    Activity Tolerance Patient tolerated treatment well    Behavior During Therapy WFL for tasks assessed/performed             Past Medical History:  Diagnosis Date   Anxiety    Arthritis    lumbar stenosis   Complication of anesthesia    Depression    Diabetes mellitus    Family history of adverse reaction to anesthesia    family history of N&V   GERD (gastroesophageal reflux disease)    Hyperlipidemia    Hypertension    Irritable bowel syndrome    PONV (postoperative nausea and vomiting)    Past Surgical History:  Procedure Laterality Date   ANTERIOR CERVICAL DECOMPRESSION/DISCECTOMY FUSION 4 LEVELS N/A 05/04/2023   Procedure: Cervical Four to Cervical Seven Anterior Cervical Discectomy and Fusion;  Surgeon: Jadene Pierini, MD;  Location: [facility] OR;  Service: Neurosurgery;  Laterality: N/A;  RM 21 3C   CARPAL TUNNEL RELEASE     Patient has had three times - 2 times right hand, one time to the left   CERVICAL CONE BIOPSY  2009   CHOLECYSTECTOMY  1997   COLONOSCOPY     COLONOSCOPY WITH PROPOFOL N/A 07/16/2022   Procedure: COLONOSCOPY WITH PROPOFOL;  Surgeon: Dolores Frame, MD;  Location: [facility];  Service: Gastroenterology;  Laterality: N/A;  1055 ASA 2   DILATION AND CURETTAGE OF UTERUS  2009   HERNIA REPAIR  2004   Incisional Hernia from Liver Resection   LIVER SURGERY  2000   Liver resection   NM MYOCAR PERF WALL MOTION   02/16/2012   low risk study.  Small area of breast attenuation   TONSILLECTOMY  1978   TRIGGER FINGER RELEASE Bilateral    several times   UPPER GASTROINTESTINAL ENDOSCOPY     US ECHOCARDIOGRAPHY  02/16/2012   mild concentric LVH, EF >55%   Patient Active Problem List   Diagnosis Date Noted   Cervical radiculopathy 05/04/2023   Lumbar stenosis with neurogenic claudication 05/29/2019   Liver masses 11/14/2012   TRANSAMINASES, SERUM, ELEVATED 07/24/2009   EPIGASTRIC PAIN 06/13/2009   Diarrhea 05/22/2009   MELENA 04/10/2009   BURN, FIRST DEGREE, ABDOMINAL WALL 04/10/2009   DIABETES MELLITUS, TYPE II, UNCONTROLLED 08/23/2007   HYPERLIPIDEMIA 11/10/2006   MORBID OBESITY 11/10/2006   ANXIETY 11/10/2006   DEPRESSION 11/10/2006   MIGRAINE HEADACHE 11/10/2006   CARPAL TUNNEL SYNDROME 11/10/2006   HYPERTENSION 11/10/2006   ALLERGIC RHINITIS 11/10/2006   GERD 11/10/2006   IBS 11/10/2006    ONSET DATE: 06/13/23  REFERRING DIAG: T81.40XA (ICD-10-CM) - Infection following a procedure, unspecified, initial encounter  THERAPY DIAG:  Open wound of neck, initial encounter  Cervicalgia  Rationale for Evaluation and Treatment: Rehabilitation     Wound Therapy - 06/23/23 0001     Subjective Patient states neck surgery 05/04/23  and the incision healed and then after showering on 06/13/23 she noticed drainage on her neck with wound present.    Patient and Family Stated Goals wound to heal    Date of Onset 06/13/23    Prior Treatments antibiotics    Pain Scale 0-10    Pain Score 0-No pain    Evaluation and Treatment Procedures Explained to Patient/Family Yes    Evaluation and Treatment Procedures agreed to    Wound Properties Date First Assessed: 06/23/23 Time First Assessed: 1440 Wound Type: Incision - Dehisced Location: Cervical Location Orientation: Anterior;Left Wound Description (Comments): wound at medial aspect of incision Present on Admission: Yes   Wound Image Images linked: 1     Dressing Type Other (Comment)   band aid   Dressing Changed Changed    Dressing Status Old drainage    Dressing Change Frequency PRN    Site / Wound Assessment Granulation tissue;Other (Comment);Pink;Red   light yellow/white slough   % Wound base Red or Granulating 75%    % Wound base Yellow/Fibrinous Exudate 25%    Wound Length (cm) 0.4 cm    Wound Width (cm) 0.6 cm    Wound Depth (cm) 0.7 cm    Wound Volume (cm^3) 0.17 cm^3    Wound Surface Area (cm^2) 0.24 cm^2    Margins Epibole (rolled edges)    Drainage Amount Minimal    Drainage Description Serous    Treatment Cleansed;Debridement (Selective)    Selective Debridement (non-excisional) - Location neck wound    Selective Debridement (non-excisional) - Tools Used Forceps    Selective Debridement (non-excisional) - Tissue Removed epibole margins, peeling skin    Wound Therapy - Clinical Statement see below    Wound Therapy - Functional Problem List bathing    Factors Delaying/Impairing Wound Healing Multiple medical problems;Diabetes Mellitus    Hydrotherapy Plan Debridement;Dressing change;Electrical stimulation;Patient/family education;Pulsatile lavage with suction;Ultrasonic wound therapy @35  KHz (+/- 3 KHz)    Wound Therapy - Frequency 2X / week    Wound Therapy - Current Recommendations PT    Wound Plan debride/dressing changes    Dressing  medihoney on rolled conform lightly tucked into wound followed by folded 2x2 and band aid               PATIENT EDUCATION: Education details: Patient educated on exam findings, POC, dressing changes if soiled Person educated: Patient Education method: Explanation Education comprehension: verbalized understanding   HOME EXERCISE PROGRAM: N/a   GOALS: Goals reviewed with patient? Yes  SHORT TERM GOALS: Target date: 07/07/2023    Wound to decrease to at max 0.3 depth to demonstrate wound healing. Baseline: Goal status: INITIAL    LONG TERM GOALS: Target date:  07/21/2023    Wound to be healed to reduce risk of infection Baseline:  Goal status: INITIAL   ASSESSMENT:  CLINICAL IMPRESSION: Patient a 52 y.o. y.o. female who was seen today for physical therapy evaluation and treatment for an infection/wound following a procedure. Patient s/p C4-C7 ACDF on 05/04/23. Patient presents with wound that she noticed open and draining on 06/13/23. Wound is clean with slight white/yellow slough and epibole margins but overall appears healthy. Debrided margins as able. Dressed with medihoney gel infused on conform and lightly tucked into wound followed by folded 2x2 and bandaid.  Patient will benefit from PT to promote wound healing.    OBJECTIVE IMPAIRMENTS: decreased mobility, postural dysfunction, and skin integrity .   ACTIVITY LIMITATIONS: bathing and hygiene/grooming  PARTICIPATION LIMITATIONS: meal prep,  cleaning, laundry, community activity, and yard work  PERSONAL FACTORS: Fitness and 3+ comorbidities: DM, HTN, increased BMI  are also affecting patient's functional outcome.   REHAB POTENTIAL: Good  CLINICAL DECISION MAKING: Stable/uncomplicated  EVALUATION COMPLEXITY: Low  PLAN: PT FREQUENCY: 2x/week  PT DURATION: 4 weeks  PLANNED INTERVENTIONS: Therapeutic exercises, Therapeutic activity, Neuromuscular re-education, Balance training, Gait training, Patient/Family education, Joint manipulation, Joint mobilization, Stair training, Orthotic/Fit training, DME instructions, Aquatic Therapy, Dry Needling, Electrical stimulation, Spinal manipulation, Spinal mobilization, Cryotherapy, Moist heat, Compression bandaging, scar mobilization, Splintting, Taping, Traction, Ultrasound, Ionotophoresis 4mg /ml Dexamethasone, and Manual therapy   PLAN FOR NEXT SESSION: debride and dressing changes PRN   Wyman Songster, PT 06/23/2023, 3:07 PM

## 2023-06-25 ENCOUNTER — Ambulatory Visit (HOSPITAL_COMMUNITY): Payer: BC Managed Care – PPO | Admitting: Physical Therapy

## 2023-06-25 DIAGNOSIS — S1190XA Unspecified open wound of unspecified part of neck, initial encounter: Secondary | ICD-10-CM | POA: Diagnosis not present

## 2023-06-25 DIAGNOSIS — M542 Cervicalgia: Secondary | ICD-10-CM

## 2023-06-25 NOTE — Therapy (Signed)
OUTPATIENT PHYSICAL THERAPY Wound treatment    Patient Name: Tamara Solomon MRN: [id number] DOB:[birth year], 52 y.o., female Today's Date: 06/25/2023   PCP: Benita Stabile, MD REFERRING PROVIDER: Benita Stabile, MD  END OF SESSION:  PT End of Session - 06/25/23 1625     Visit Number 2    Number of Visits 8    Date for PT Re-Evaluation 07/21/23    Authorization Type BCBS state health plan    Authorization Time Period no auth required    PT Start Time 1607    PT Stop Time 1623    PT Time Calculation (min) 16 min    Activity Tolerance Patient tolerated treatment well    Behavior During Therapy WFL for tasks assessed/performed             Past Medical History:  Diagnosis Date   Anxiety    Arthritis    lumbar stenosis   Complication of anesthesia    Depression    Diabetes mellitus    Family history of adverse reaction to anesthesia    family history of N&V   GERD (gastroesophageal reflux disease)    Hyperlipidemia    Hypertension    Irritable bowel syndrome    PONV (postoperative nausea and vomiting)    Past Surgical History:  Procedure Laterality Date   ANTERIOR CERVICAL DECOMPRESSION/DISCECTOMY FUSION 4 LEVELS N/A 05/04/2023   Procedure: Cervical Four to Cervical Seven Anterior Cervical Discectomy and Fusion;  Surgeon: Jadene Pierini, MD;  Location: [facility];  Service: Neurosurgery;  Laterality: N/A;  RM 21 3C   CARPAL TUNNEL RELEASE     Patient has had three times - 2 times right hand, one time to the left   CERVICAL CONE BIOPSY  2009   CHOLECYSTECTOMY  1997   COLONOSCOPY     COLONOSCOPY WITH PROPOFOL N/A 07/16/2022   Procedure: COLONOSCOPY WITH PROPOFOL;  Surgeon: Dolores Frame, MD;  Location: [facility];  Service: Gastroenterology;  Laterality: N/A;  1055 ASA 2   DILATION AND CURETTAGE OF UTERUS  2009   HERNIA REPAIR  2004   Incisional Hernia from Liver Resection   LIVER SURGERY  2000   Liver resection   NM MYOCAR PERF WALL MOTION   02/16/2012   low risk study.  Small area of breast attenuation   TONSILLECTOMY  1978   TRIGGER FINGER RELEASE Bilateral    several times   UPPER GASTROINTESTINAL ENDOSCOPY     US ECHOCARDIOGRAPHY  02/16/2012   mild concentric LVH, EF >55%   Patient Active Problem List   Diagnosis Date Noted   Cervical radiculopathy 05/04/2023   Lumbar stenosis with neurogenic claudication 05/29/2019   Liver masses 11/14/2012   TRANSAMINASES, SERUM, ELEVATED 07/24/2009   EPIGASTRIC PAIN 06/13/2009   Diarrhea 05/22/2009   MELENA 04/10/2009   BURN, FIRST DEGREE, ABDOMINAL WALL 04/10/2009   DIABETES MELLITUS, TYPE II, UNCONTROLLED 08/23/2007   HYPERLIPIDEMIA 11/10/2006   MORBID OBESITY 11/10/2006   ANXIETY 11/10/2006   DEPRESSION 11/10/2006   MIGRAINE HEADACHE 11/10/2006   CARPAL TUNNEL SYNDROME 11/10/2006   HYPERTENSION 11/10/2006   ALLERGIC RHINITIS 11/10/2006   GERD 11/10/2006   IBS 11/10/2006    ONSET DATE: 06/13/23  REFERRING DIAG: T81.40XA (ICD-10-CM) - Infection following a procedure, unspecified, initial encounter  THERAPY DIAG:  Open wound of neck, initial encounter  Cervicalgia  Rationale for Evaluation and Treatment: Rehabilitation     Wound Therapy - 06/25/23 0001     Subjective PT states that she  had to change the dressing as it got wet in the shower.  Pt has no pain.     Patient and Family Stated Goals wound to heal    Date of Onset 06/13/23    Prior Treatments antibiotics    Evaluation and Treatment Procedures Explained to Patient/Family Yes    Evaluation and Treatment Procedures agreed to    Wound Properties Date First Assessed: 06/23/23 Time First Assessed: 1440 Wound Type: Incision - Dehisced Location: Cervical Location Orientation: Anterior;Left Wound Description (Comments): wound at medial aspect of incision Present on Admission: Yes   Dressing Type Other (Comment)   band aid   Dressing Status Old drainage    Dressing Change Frequency PRN    Site / Wound  Assessment Granulation tissue;Other (Comment);Pink;Red    % Wound base Red or Granulating 100%    % Wound base Yellow/Fibrinous Exudate 0%    Margins Epibole (rolled edges)    Drainage Amount Minimal    Drainage Description Serous    Treatment Cleansed    Wound Therapy - Clinical Statement see below    Wound Therapy - Functional Problem List bathing    Factors Delaying/Impairing Wound Healing Multiple medical problems;Diabetes Mellitus    Hydrotherapy Plan Debridement;Dressing change;Electrical stimulation;Patient/family education;Pulsatile lavage with suction;Ultrasonic wound therapy @35  KHz (+/- 3 KHz)    Wound Therapy - Frequency 2X / week    Wound Therapy - Current Recommendations PT    Wound Plan debride/dressing changes    Dressing  Wound cleansed with hydrogel and a 2x2 prior to dressing.  Dressed with medihoney on 2x2 lightly packed into wound and then folded over followed by second 2x2 and medipore tape.               PATIENT EDUCATION: Education details: Patient educated on exam findings, POC, dressing changes if soiled Person educated: Patient Education method: Explanation Education comprehension: verbalized understanding   HOME EXERCISE PROGRAM: N/a   GOALS: Goals reviewed with patient? Yes  SHORT TERM GOALS: Target date: 07/07/2023    Wound to decrease to at max 0.3 depth to demonstrate wound healing. Baseline: Goal status: IN PROGRESS    LONG TERM GOALS: Target date: 07/21/2023    Wound to be healed to reduce risk of infection Baseline:  Goal status: IN PROGRESS   ASSESSMENT:  CLINICAL IMPRESSION:  Wound is clean with 100% granulation, slight epibole margins . PT is diabetic and states that her blood sugars run around 170.  Therapist encouraged pt to attempt to get her sugar level down as close to 100 as able as this will assist with the healing.   .  Patient will benefit from PT to promote wound healing.    OBJECTIVE IMPAIRMENTS: decreased  mobility, postural dysfunction, and skin integrity .   ACTIVITY LIMITATIONS: bathing and hygiene/grooming  PARTICIPATION LIMITATIONS: meal prep, cleaning, laundry, community activity, and yard work  PERSONAL FACTORS: Fitness and 3+ comorbidities: DM, HTN, increased BMI  are also affecting patient's functional outcome.   REHAB POTENTIAL: Good  CLINICAL DECISION MAKING: Stable/uncomplicated  EVALUATION COMPLEXITY: Low  PLAN: PT FREQUENCY: 2x/week  PT DURATION: 4 weeks  PLANNED INTERVENTIONS: Therapeutic exercises, Therapeutic activity, Neuromuscular re-education, Balance training, Gait training, Patient/Family education, Joint manipulation, Joint mobilization, Stair training, Orthotic/Fit training, DME instructions, Aquatic Therapy, Dry Needling, Electrical stimulation, Spinal manipulation, Spinal mobilization, Cryotherapy, Moist heat, Compression bandaging, scar mobilization, Splintting, Taping, Traction, Ultrasound, Ionotophoresis 4mg /ml Dexamethasone, and Manual therapy   PLAN FOR NEXT SESSION: debride if needed  and dressing changes  PRN  Virgina Organ, PT CLT [phone]  06/25/2023, 4:31 PM

## 2023-06-28 ENCOUNTER — Ambulatory Visit (HOSPITAL_COMMUNITY): Payer: BC Managed Care – PPO | Admitting: Physical Therapy

## 2023-06-28 DIAGNOSIS — S1190XA Unspecified open wound of unspecified part of neck, initial encounter: Secondary | ICD-10-CM

## 2023-06-28 NOTE — Therapy (Signed)
OUTPATIENT PHYSICAL THERAPY Wound treatment    Patient Name: Tamara Solomon MRN: [id number] DOB:[birth year], 52 y.o., female Today's Date: 06/28/2023   PCP: Benita Stabile, MD REFERRING PROVIDER: Benita Stabile, MD  END OF SESSION:  PT End of Session - 06/28/23 0854     Visit Number 3    Number of Visits 8    Date for PT Re-Evaluation 07/21/23    Authorization Type BCBS state health plan    Authorization Time Period no auth required    PT Start Time 0826    PT Stop Time 0840    PT Time Calculation (min) 14 min    Activity Tolerance Patient tolerated treatment well    Behavior During Therapy [facility] for tasks assessed/performed             Past Medical History:  Diagnosis Date   Anxiety    Arthritis    lumbar stenosis   Complication of anesthesia    Depression    Diabetes mellitus    Family history of adverse reaction to anesthesia    family history of N&V   GERD (gastroesophageal reflux disease)    Hyperlipidemia    Hypertension    Irritable bowel syndrome    PONV (postoperative nausea and vomiting)    Past Surgical History:  Procedure Laterality Date   ANTERIOR CERVICAL DECOMPRESSION/DISCECTOMY FUSION 4 LEVELS N/A 05/04/2023   Procedure: Cervical Four to Cervical Seven Anterior Cervical Discectomy and Fusion;  Surgeon: Jadene Pierini, MD;  Location: [facility];  Service: Neurosurgery;  Laterality: N/A;  RM 21 3C   CARPAL TUNNEL RELEASE     Patient has had three times - 2 times right hand, one time to the left   CERVICAL CONE BIOPSY  2009   CHOLECYSTECTOMY  1997   COLONOSCOPY     COLONOSCOPY WITH PROPOFOL N/A 07/16/2022   Procedure: COLONOSCOPY WITH PROPOFOL;  Surgeon: Dolores Frame, MD;  [facility]: [facility];  Service: Gastroenterology;  Laterality: N/A;  1055 ASA 2   DILATION AND CURETTAGE OF UTERUS  2009   HERNIA REPAIR  2004   Incisional Hernia from Liver Resection   LIVER SURGERY  2000   Liver resection   NM MYOCAR PERF WALL MOTION   02/16/2012   low risk study.  Small area of breast attenuation   TONSILLECTOMY  1978   TRIGGER FINGER RELEASE Bilateral    several times   UPPER GASTROINTESTINAL ENDOSCOPY     US ECHOCARDIOGRAPHY  02/16/2012   mild concentric LVH, EF >55%   Patient Active Problem List   Diagnosis Date Noted   Cervical radiculopathy 05/04/2023   Lumbar stenosis with neurogenic claudication 05/29/2019   Liver masses 11/14/2012   TRANSAMINASES, SERUM, ELEVATED 07/24/2009   EPIGASTRIC PAIN 06/13/2009   Diarrhea 05/22/2009   MELENA 04/10/2009   BURN, FIRST DEGREE, ABDOMINAL WALL 04/10/2009   DIABETES MELLITUS, TYPE II, UNCONTROLLED 08/23/2007   HYPERLIPIDEMIA 11/10/2006   MORBID OBESITY 11/10/2006   ANXIETY 11/10/2006   DEPRESSION 11/10/2006   MIGRAINE HEADACHE 11/10/2006   CARPAL TUNNEL SYNDROME 11/10/2006   HYPERTENSION 11/10/2006   ALLERGIC RHINITIS 11/10/2006   GERD 11/10/2006   IBS 11/10/2006    ONSET DATE: 06/13/23  REFERRING DIAG: T81.40XA (ICD-10-CM) - Infection following a procedure, unspecified, initial encounter  THERAPY DIAG:  Open wound of neck, initial encounter  Rationale for Evaluation and Treatment: Rehabilitation  Wound Therapy - 06/28/23 0855     Subjective pt reports the banaids work better.  States it  got wet with showering    Patient and Family Stated Goals wound to heal    Date of Onset 06/13/23    Prior Treatments antibiotics    Pain Scale 0-10    Pain Score 0-No pain    Evaluation and Treatment Procedures Explained to Patient/Family Yes    Evaluation and Treatment Procedures agreed to    Wound Properties Date First Assessed: 06/23/23 Time First Assessed: 1440 Wound Type: Incision - Dehisced Location: Cervical Location Orientation: Anterior;Left Wound Description (Comments): wound at medial aspect of incision Present on Admission: Yes   Dressing Type Other (Comment)   band aid   Dressing Changed Changed    Dressing Status Old drainage    Dressing Change Frequency  PRN    Site / Wound Assessment Granulation tissue;Other (Comment);Pink;Red    % Wound base Red or Granulating 100%    Margins Epibole (rolled edges)    Drainage Amount Minimal    Drainage Description Serous    Treatment Cleansed    Wound Therapy - Clinical Statement see below    Wound Therapy - Functional Problem List bathing    Factors Delaying/Impairing Wound Healing Multiple medical problems;Diabetes Mellitus    Hydrotherapy Plan Debridement;Dressing change;Electrical stimulation;Patient/family education;Pulsatile lavage with suction;Ultrasonic wound therapy @35  KHz (+/- 3 KHz)    Wound Therapy - Frequency 2X / week    Wound Therapy - Current Recommendations PT    Wound Plan debride/dressing changes    Dressing  medihoney on slither of gauze lightly packed into wound with 1/2 2X2 over followed by bandaid             PATIENT EDUCATION: Education details: Patient educated on exam findings, POC, dressing changes if soiled Person educated: Patient Education method: Explanation Education comprehension: verbalized understanding   HOME EXERCISE PROGRAM: N/a   GOALS: Goals reviewed with patient? Yes  SHORT TERM GOALS: Target date: 07/07/2023    Wound to decrease to at max 0.3 depth to demonstrate wound healing. Baseline: Goal status: IN PROGRESS    LONG TERM GOALS: Target date: 07/21/2023    Wound to be healed to reduce risk of infection Baseline:  Goal status: IN PROGRESS   ASSESSMENT:  CLINICAL IMPRESSION:  Wound is clean with 100% granulation, no sharps debridement needed.  Cleansed wound well and continued to pack with medihoney gel.  PT prefers band aids as they are more discrete. Explained band aids were okay as long as she used gauze to prevent maceration.  Informed we would measure wound next visit for progress.   Patient will benefit from PT to promote wound healing.    OBJECTIVE IMPAIRMENTS: decreased mobility, postural dysfunction, and skin integrity .    ACTIVITY LIMITATIONS: bathing and hygiene/grooming  PARTICIPATION LIMITATIONS: meal prep, cleaning, laundry, community activity, and yard work  PERSONAL FACTORS: Fitness and 3+ comorbidities: DM, HTN, increased BMI  are also affecting patient's functional outcome.   REHAB POTENTIAL: Good  CLINICAL DECISION MAKING: Stable/uncomplicated  EVALUATION COMPLEXITY: Low  PLAN: PT FREQUENCY: 2x/week  PT DURATION: 4 weeks  PLANNED INTERVENTIONS: Therapeutic exercises, Therapeutic activity, Neuromuscular re-education, Balance training, Gait training, Patient/Family education, Joint manipulation, Joint mobilization, Stair training, Orthotic/Fit training, DME instructions, Aquatic Therapy, Dry Needling, Electrical stimulation, Spinal manipulation, Spinal mobilization, Cryotherapy, Moist heat, Compression bandaging, scar mobilization, Splintting, Taping, Traction, Ultrasound, Ionotophoresis 4mg /ml Dexamethasone, and Manual therapy   PLAN FOR NEXT SESSION: debride if needed  and dressing changes PRN.  Measure on Thursday.  Lurena Nida, PTA/CLT [facility] Ph: [phone]  06/28/2023, 8:58 AM

## 2023-06-29 ENCOUNTER — Ambulatory Visit: Payer: BC Managed Care – PPO | Admitting: Internal Medicine

## 2023-07-01 ENCOUNTER — Encounter (HOSPITAL_COMMUNITY): Payer: Self-pay

## 2023-07-01 ENCOUNTER — Ambulatory Visit (HOSPITAL_COMMUNITY): Payer: BC Managed Care – PPO

## 2023-07-01 DIAGNOSIS — S1190XA Unspecified open wound of unspecified part of neck, initial encounter: Secondary | ICD-10-CM

## 2023-07-01 NOTE — Therapy (Signed)
OUTPATIENT PHYSICAL THERAPY Wound treatment    Patient Name: Tamara Solomon MRN: [id number] DOB:02-11-1971, 52 y.o., female Today's Date: 07/01/2023   PCP: Benita Stabile, MD REFERRING PROVIDER: Benita Stabile, MD  END OF SESSION:  PT End of Session - 07/01/23 0933     Visit Number 4    Number of Visits 8    Date for PT Re-Evaluation 07/21/23    Authorization Type BCBS state health plan    Authorization Time Period no auth required    PT Start Time 0903    PT Stop Time 0920    PT Time Calculation (min) 17 min    Activity Tolerance Patient tolerated treatment well    Behavior During Therapy [facility] for tasks assessed/performed             Past Medical History:  Diagnosis Date   Anxiety    Arthritis    lumbar stenosis   Complication of anesthesia    Depression    Diabetes mellitus    Family history of adverse reaction to anesthesia    family history of N&V   GERD (gastroesophageal reflux disease)    Hyperlipidemia    Hypertension    Irritable bowel syndrome    PONV (postoperative nausea and vomiting)    Past Surgical History:  Procedure Laterality Date   ANTERIOR CERVICAL DECOMPRESSION/DISCECTOMY FUSION 4 LEVELS N/A 05/04/2023   Procedure: Cervical Four to Cervical Seven Anterior Cervical Discectomy and Fusion;  Surgeon: Jadene Pierini, MD;  Location: [facility];  Service: Neurosurgery;  Laterality: N/A;  RM 21 3C   CARPAL TUNNEL RELEASE     Patient has had three times - 2 times right hand, one time to the left   CERVICAL CONE BIOPSY  2009   CHOLECYSTECTOMY  1997   COLONOSCOPY     COLONOSCOPY WITH PROPOFOL N/A 07/16/2022   Procedure: COLONOSCOPY WITH PROPOFOL;  Surgeon: Dolores Frame, MD;  [facility]: [facility];  Service: Gastroenterology;  Laterality: N/A;  1055 ASA 2   DILATION AND CURETTAGE OF UTERUS  2009   HERNIA REPAIR  2004   Incisional Hernia from Liver Resection   LIVER SURGERY  2000   Liver resection   NM MYOCAR PERF WALL MOTION   02/16/2012   low risk study.  Small area of breast attenuation   TONSILLECTOMY  1978   TRIGGER FINGER RELEASE Bilateral    several times   UPPER GASTROINTESTINAL ENDOSCOPY     US ECHOCARDIOGRAPHY  02/16/2012   mild concentric LVH, EF >55%   Patient Active Problem List   Diagnosis Date Noted   Cervical radiculopathy 05/04/2023   Lumbar stenosis with neurogenic claudication 05/29/2019   Liver masses 11/14/2012   TRANSAMINASES, SERUM, ELEVATED 07/24/2009   EPIGASTRIC PAIN 06/13/2009   Diarrhea 05/22/2009   MELENA 04/10/2009   BURN, FIRST DEGREE, ABDOMINAL WALL 04/10/2009   DIABETES MELLITUS, TYPE II, UNCONTROLLED 08/23/2007   HYPERLIPIDEMIA 11/10/2006   MORBID OBESITY 11/10/2006   ANXIETY 11/10/2006   DEPRESSION 11/10/2006   MIGRAINE HEADACHE 11/10/2006   CARPAL TUNNEL SYNDROME 11/10/2006   HYPERTENSION 11/10/2006   ALLERGIC RHINITIS 11/10/2006   GERD 11/10/2006   IBS 11/10/2006    ONSET DATE: 06/13/23  REFERRING DIAG: T81.40XA (ICD-10-CM) - Infection following a procedure, unspecified, initial encounter  THERAPY DIAG:  Open wound of neck, initial encounter  Rationale for Evaluation and Treatment: Rehabilitation  Wound Therapy - 07/01/23 0001     Subjective Pt stated she has noticed a smell from wound.  Reports her sugar levels have been in the 300s since surgery.  No reports of pain from wound, does have posterior neck pain and radicular symptoms down Rt UE.  Pain scale 5/10 posterior neck pain    Patient and Family Stated Goals wound to heal    Date of Onset 06/13/23    Prior Treatments antibiotics    Pain Scale 0-10   no pain in wound, posterior neck pain 5/10   Pain Score 0-No pain    Evaluation and Treatment Procedures Explained to Patient/Family Yes    Evaluation and Treatment Procedures agreed to    Wound Properties Date First Assessed: 06/23/23 Time First Assessed: 1440 Wound Type: Incision - Dehisced Location: Cervical Location Orientation: Anterior;Left Wound  Description (Comments): wound at medial aspect of incision Present on Admission: Yes   Dressing Type Other (Comment)   medihoney, vaseline perimeter, 2x2 and bandaid   Dressing Changed Changed    Dressing Status Old drainage    Dressing Change Frequency PRN    Site / Wound Assessment Granulation tissue;Other (Comment);Pink;Red    % Wound base Red or Granulating 100%    Wound Length (cm) 0.5 cm    Wound Width (cm) 0.6 cm    Wound Depth (cm) 0.7 cm    Wound Volume (cm^3) 0.21 cm^3    Wound Surface Area (cm^2) 0.3 cm^2    Margins Epibole (rolled edges)    Drainage Amount Minimal    Drainage Description Serous    Treatment Cleansed    Wound Therapy - Clinical Statement see below    Wound Therapy - Functional Problem List bathing    Factors Delaying/Impairing Wound Healing Multiple medical problems;Diabetes Mellitus    Hydrotherapy Plan Debridement;Dressing change;Electrical stimulation;Patient/family education;Pulsatile lavage with suction;Ultrasonic wound therapy @35  KHz (+/- 3 KHz)    Wound Therapy - Frequency 2X / week    Wound Therapy - Current Recommendations PT    Wound Plan debride/dressing changes    Dressing  medihoney on slither of gauze lightly packed into wound with 1/2 2X2 over followed by bandaid             PATIENT EDUCATION: Education details: Patient educated on exam findings, POC, dressing changes if soiled Person educated: Patient Education method: Explanation Education comprehension: verbalized understanding   HOME EXERCISE PROGRAM: N/a   GOALS: Goals reviewed with patient? Yes  SHORT TERM GOALS: Target date: 07/07/2023    Wound to decrease to at max 0.3 depth to demonstrate wound healing. Baseline: Goal status: IN PROGRESS    LONG TERM GOALS: Target date: 07/21/2023    Wound to be healed to reduce risk of infection Baseline:  Goal status: IN PROGRESS   ASSESSMENT:  CLINICAL IMPRESSION: Wound is clean with 100% granulation, no debridement  necessary.  Cleansed wound well and continued to pack with medihoney.  Wound with minimal changes in measurements and continues to have some depth.  Pt stated her sugar levels have been in the 300s since surgery.  Advised to contact MD as high sugar levels can delay healing.  Continued with bandaids, educated to place gauze to prevent maceration and continue to watch for skin integrity surrounding wound.    OBJECTIVE IMPAIRMENTS: decreased mobility, postural dysfunction, and skin integrity .   ACTIVITY LIMITATIONS: bathing and hygiene/grooming  PARTICIPATION LIMITATIONS: meal prep, cleaning, laundry, community activity, and yard work  PERSONAL FACTORS: Fitness and 3+ comorbidities: DM, HTN, increased BMI  are also affecting patient's functional outcome.   REHAB POTENTIAL: Good  CLINICAL DECISION  MAKING: Stable/uncomplicated  EVALUATION COMPLEXITY: Low  PLAN: PT FREQUENCY: 2x/week  PT DURATION: 4 weeks  PLANNED INTERVENTIONS: Therapeutic exercises, Therapeutic activity, Neuromuscular re-education, Balance training, Gait training, Patient/Family education, Joint manipulation, Joint mobilization, Stair training, Orthotic/Fit training, DME instructions, Aquatic Therapy, Dry Needling, Electrical stimulation, Spinal manipulation, Spinal mobilization, Cryotherapy, Moist heat, Compression bandaging, scar mobilization, Splintting, Taping, Traction, Ultrasound, Ionotophoresis 4mg /ml Dexamethasone, and Manual therapy   PLAN FOR NEXT SESSION: debride if needed  and dressing changes PRN.  Measure on Thursday.  Becky Sax, LPTA/CLT; CBIS [phone]  Juel Burrow, PTA 07/01/2023, 9:34 AM   07/01/2023, 9:34 AM

## 2023-07-05 ENCOUNTER — Ambulatory Visit (HOSPITAL_COMMUNITY): Payer: BC Managed Care – PPO

## 2023-07-05 ENCOUNTER — Encounter (HOSPITAL_COMMUNITY): Payer: Self-pay

## 2023-07-05 DIAGNOSIS — S1190XA Unspecified open wound of unspecified part of neck, initial encounter: Secondary | ICD-10-CM | POA: Diagnosis not present

## 2023-07-05 NOTE — Therapy (Signed)
OUTPATIENT PHYSICAL THERAPY Wound treatment    Patient Name: Tamara Solomon MRN: [id number] DOB:[birth year], 52 y.o., female Today's Date: 07/05/2023   PCP: Benita Stabile, MD REFERRING PROVIDER: Benita Stabile, MD  END OF SESSION:  PT End of Session - 07/05/23 0930     Visit Number 5    Number of Visits 8    Date for PT Re-Evaluation 07/21/23    Authorization Type BCBS state health plan    Authorization Time Period no auth required    PT Start Time 0904    PT Stop Time 0924    PT Time Calculation (min) 20 min    Activity Tolerance Patient tolerated treatment well    Behavior During Therapy [facility] for tasks assessed/performed             Past Medical History:  Diagnosis Date   Anxiety    Arthritis    lumbar stenosis   Complication of anesthesia    Depression    Diabetes mellitus    Family history of adverse reaction to anesthesia    family history of N&V   GERD (gastroesophageal reflux disease)    Hyperlipidemia    Hypertension    Irritable bowel syndrome    PONV (postoperative nausea and vomiting)    Past Surgical History:  Procedure Laterality Date   ANTERIOR CERVICAL DECOMPRESSION/DISCECTOMY FUSION 4 LEVELS N/A 05/04/2023   Procedure: Cervical Four to Cervical Seven Anterior Cervical Discectomy and Fusion;  Surgeon: Jadene Pierini, MD;  Location: [facility] OR;  Service: Neurosurgery;  Laterality: N/A;  RM 21 3C   CARPAL TUNNEL RELEASE     Patient has had three times - 2 times right hand, one time to the left   CERVICAL CONE BIOPSY  2009   CHOLECYSTECTOMY  1997   COLONOSCOPY     COLONOSCOPY WITH PROPOFOL N/A 07/16/2022   Procedure: COLONOSCOPY WITH PROPOFOL;  Surgeon: Dolores Frame, MD;  Location: [facility];  Service: Gastroenterology;  Laterality: N/A;  1055 ASA 2   DILATION AND CURETTAGE OF UTERUS  2009   HERNIA REPAIR  2004   Incisional Hernia from Liver Resection   LIVER SURGERY  2000   Liver resection   NM MYOCAR PERF WALL MOTION   02/16/2012   low risk study.  Small area of breast attenuation   TONSILLECTOMY  1978   TRIGGER FINGER RELEASE Bilateral    several times   UPPER GASTROINTESTINAL ENDOSCOPY     US ECHOCARDIOGRAPHY  02/16/2012   mild concentric LVH, EF >55%   Patient Active Problem List   Diagnosis Date Noted   Cervical radiculopathy 05/04/2023   Lumbar stenosis with neurogenic claudication 05/29/2019   Liver masses 11/14/2012   TRANSAMINASES, SERUM, ELEVATED 07/24/2009   EPIGASTRIC PAIN 06/13/2009   Diarrhea 05/22/2009   MELENA 04/10/2009   BURN, FIRST DEGREE, ABDOMINAL WALL 04/10/2009   DIABETES MELLITUS, TYPE II, UNCONTROLLED 08/23/2007   HYPERLIPIDEMIA 11/10/2006   MORBID OBESITY 11/10/2006   ANXIETY 11/10/2006   DEPRESSION 11/10/2006   MIGRAINE HEADACHE 11/10/2006   CARPAL TUNNEL SYNDROME 11/10/2006   HYPERTENSION 11/10/2006   ALLERGIC RHINITIS 11/10/2006   GERD 11/10/2006   IBS 11/10/2006    ONSET DATE: 06/13/23  REFERRING DIAG: T81.40XA (ICD-10-CM) - Infection following a procedure, unspecified, initial encounter  THERAPY DIAG:  Open wound of neck, initial encounter  Rationale for Evaluation and Treatment: Rehabilitation  Wound Therapy - 07/05/23 0001     Subjective Reports she has been changing bandages regularly and has  noticed blood, reports new drainage.  Stated she contacted her MD concerning high blood sugar levels, stated she has cut down bread that has helped to reduce sugar levels.    Patient and Family Stated Goals wound to heal    Date of Onset 06/13/23    Prior Treatments antibiotics    Pain Scale 0-10    Pain Score 0-No pain    Evaluation and Treatment Procedures Explained to Patient/Family Yes    Evaluation and Treatment Procedures agreed to    Wound Properties Date First Assessed: 06/23/23 Time First Assessed: 1440 Wound Type: Incision - Dehisced Location: Cervical Location Orientation: Anterior;Left Wound Description (Comments): wound at medial aspect of incision  Present on Admission: Yes   Dressing Type Other (Comment)   medihoney on slither of gauze lightly packed into wound with 1/2 2X2 over followed by bandaid   Dressing Changed Changed    Dressing Status Old drainage    Dressing Change Frequency PRN    Site / Wound Assessment Granulation tissue;Other (Comment);Pink;Red    % Wound base Red or Granulating 100%    % Wound base Yellow/Fibrinous Exudate 0%    Wound Length (cm) 0.4 cm    Wound Width (cm) 0.6 cm    Wound Depth (cm) 0.6 cm    Wound Volume (cm^3) 0.14 cm^3    Wound Surface Area (cm^2) 0.24 cm^2    Margins Epibole (rolled edges)    Drainage Amount Minimal    Drainage Description Serous    Treatment Cleansed    Wound Therapy - Clinical Statement see below    Wound Therapy - Functional Problem List bathing    Factors Delaying/Impairing Wound Healing Multiple medical problems;Diabetes Mellitus    Hydrotherapy Plan Debridement;Dressing change;Electrical stimulation;Patient/family education;Pulsatile lavage with suction;Ultrasonic wound therapy @35  KHz (+/- 3 KHz)    Wound Therapy - Frequency 2X / week    Wound Therapy - Current Recommendations PT    Wound Plan debride/dressing changes.  Reduce frequency to 1x/week.    Dressing  medihoney on slither of gauze lightly packed into wound with 1/2 2X2 over followed by bandaid             PATIENT EDUCATION: Education details: Patient educated on exam findings, POC, dressing changes if soiled Person educated: Patient Education method: Explanation Education comprehension: verbalized understanding   HOME EXERCISE PROGRAM: N/a   GOALS: Goals reviewed with patient? Yes  SHORT TERM GOALS: Target date: 07/07/2023    Wound to decrease to at max 0.3 depth to demonstrate wound healing. Baseline: Goal status: IN PROGRESS    LONG TERM GOALS: Target date: 07/21/2023    Wound to be healed to reduce risk of infection Baseline:  Goal status: IN PROGRESS   ASSESSMENT:  CLINICAL  IMPRESSION: Wound is clean with 100% granulation, no debridement necessary.  Measured wound with minimal changes noted.  Discussed no skilled intervention required with pt who has been changing dressings regularly, pt stated she is a little fearful due to location of wound and difficult to see and requested [place] to continue 1x/ every other week to assure no infection.  OBJECTIVE IMPAIRMENTS: decreased mobility, postural dysfunction, and skin integrity .   ACTIVITY LIMITATIONS: bathing and hygiene/grooming  PARTICIPATION LIMITATIONS: meal prep, cleaning, laundry, community activity, and yard work  PERSONAL FACTORS: Fitness and 3+ comorbidities: DM, HTN, increased BMI  are also affecting patient's functional outcome.   REHAB POTENTIAL: Good  CLINICAL DECISION MAKING: Stable/uncomplicated  EVALUATION COMPLEXITY: Low  PLAN: PT FREQUENCY: 2x/week  PT DURATION: 4 weeks  PLANNED INTERVENTIONS: Therapeutic exercises, Therapeutic activity, Neuromuscular re-education, Balance training, Gait training, Patient/Family education, Joint manipulation, Joint mobilization, Stair training, Orthotic/Fit training, DME instructions, Aquatic Therapy, Dry Needling, Electrical stimulation, Spinal manipulation, Spinal mobilization, Cryotherapy, Moist heat, Compression bandaging, scar mobilization, Splintting, Taping, Traction, Ultrasound, Ionotophoresis 4mg /ml Dexamethasone, and Manual therapy   PLAN FOR NEXT SESSION: debride if needed  and dressing changes PRN.  Measure on Thursday.  Reduce frequency to 1x/ every other week.  Tamara Solomon, LPTA/CLT; CBIS [phone]  Tamara Solomon, PTA 07/05/2023, 9:39 AM   07/05/2023, 9:39 AM

## 2023-07-08 ENCOUNTER — Ambulatory Visit (HOSPITAL_COMMUNITY): Payer: BC Managed Care – PPO | Admitting: Physical Therapy

## 2023-07-13 ENCOUNTER — Ambulatory Visit (HOSPITAL_COMMUNITY): Payer: BC Managed Care – PPO

## 2023-07-16 ENCOUNTER — Encounter (HOSPITAL_COMMUNITY): Payer: Self-pay | Admitting: Physical Therapy

## 2023-07-16 ENCOUNTER — Ambulatory Visit (HOSPITAL_COMMUNITY): Payer: BC Managed Care – PPO | Attending: Internal Medicine | Admitting: Physical Therapy

## 2023-07-16 DIAGNOSIS — M542 Cervicalgia: Secondary | ICD-10-CM | POA: Diagnosis present

## 2023-07-16 DIAGNOSIS — S1190XA Unspecified open wound of unspecified part of neck, initial encounter: Secondary | ICD-10-CM | POA: Insufficient documentation

## 2023-07-16 NOTE — Therapy (Signed)
OUTPATIENT PHYSICAL THERAPY Wound treatment    Patient Name: Tamara Solomon MRN: [id number] DOB:1971/07/24, 52 y.o., female Today's Date: 07/16/2023  PHYSICAL THERAPY DISCHARGE SUMMARY  Visits from Start of Care: 6  Current functional level related to goals / functional outcomes: See below   Remaining deficits: See below   Education / Equipment: See below   Patient agrees to discharge. Patient goals were met. Patient is being discharged due to meeting the stated rehab goals.    PCP: Benita Stabile, MD REFERRING PROVIDER: Benita Stabile, MD  END OF SESSION:  PT End of Session - 07/16/23 0945     Visit Number 6    Number of Visits 8    Date for PT Re-Evaluation 07/21/23    Authorization Type BCBS state health plan    Authorization Time Period no auth required    PT Start Time 0945    PT Stop Time 0954    PT Time Calculation (min) 9 min    Activity Tolerance Patient tolerated treatment well    Behavior During Therapy WFL for tasks assessed/performed             Past Medical History:  Diagnosis Date   Anxiety    Arthritis    lumbar stenosis   Complication of anesthesia    Depression    Diabetes mellitus    Family history of adverse reaction to anesthesia    family history of N&V   GERD (gastroesophageal reflux disease)    Hyperlipidemia    Hypertension    Irritable bowel syndrome    PONV (postoperative nausea and vomiting)    Past Surgical History:  Procedure Laterality Date   ANTERIOR CERVICAL DECOMPRESSION/DISCECTOMY FUSION 4 LEVELS N/A 05/04/2023   Procedure: Cervical Four to Cervical Seven Anterior Cervical Discectomy and Fusion;  Surgeon: Jadene Pierini, MD;  Location: [facility] OR;  Service: Neurosurgery;  Laterality: N/A;  RM 21 3C   CARPAL TUNNEL RELEASE     Patient has had three times - 2 times right hand, one time to the left   CERVICAL CONE BIOPSY  2009   CHOLECYSTECTOMY  1997   COLONOSCOPY     COLONOSCOPY WITH PROPOFOL N/A 07/16/2022    Procedure: COLONOSCOPY WITH PROPOFOL;  Surgeon: Dolores Frame, MD;  [facility]: [facility];  Service: Gastroenterology;  Laterality: N/A;  1055 ASA 2   DILATION AND CURETTAGE OF UTERUS  2009   HERNIA REPAIR  2004   Incisional Hernia from Liver Resection   LIVER SURGERY  2000   Liver resection   NM MYOCAR PERF WALL MOTION  02/16/2012   low risk study.  Small area of breast attenuation   TONSILLECTOMY  1978   TRIGGER FINGER RELEASE Bilateral    several times   UPPER GASTROINTESTINAL ENDOSCOPY     US ECHOCARDIOGRAPHY  02/16/2012   mild concentric LVH, EF >55%   Patient Active Problem List   Diagnosis Date Noted   Cervical radiculopathy 05/04/2023   Lumbar stenosis with neurogenic claudication 05/29/2019   Liver masses 11/14/2012   TRANSAMINASES, SERUM, ELEVATED 07/24/2009   EPIGASTRIC PAIN 06/13/2009   Diarrhea 05/22/2009   MELENA 04/10/2009   BURN, FIRST DEGREE, ABDOMINAL WALL 04/10/2009   DIABETES MELLITUS, TYPE II, UNCONTROLLED 08/23/2007   HYPERLIPIDEMIA 11/10/2006   MORBID OBESITY 11/10/2006   ANXIETY 11/10/2006   DEPRESSION 11/10/2006   MIGRAINE HEADACHE 11/10/2006   CARPAL TUNNEL SYNDROME 11/10/2006   HYPERTENSION 11/10/2006   ALLERGIC RHINITIS 11/10/2006   GERD 11/10/2006  IBS 11/10/2006    ONSET DATE: 06/13/23  REFERRING DIAG: T81.40XA (ICD-10-CM) - Infection following a procedure, unspecified, initial encounter  THERAPY DIAG:  Open wound of neck, initial encounter  Cervicalgia  Rationale for Evaluation and Treatment: Rehabilitation  Wound Therapy - 07/16/23 0001     Subjective Reports she thinks the wound has healed.    Patient and Family Stated Goals wound to heal    Date of Onset 06/13/23    Prior Treatments antibiotics    Evaluation and Treatment Procedures Explained to Patient/Family Yes    Evaluation and Treatment Procedures agreed to    Wound Properties Date First Assessed: 06/23/23 Time First Assessed: 1440 Wound Type: Incision -  Dehisced Location: Cervical Location Orientation: Anterior;Left Wound Description (Comments): wound at medial aspect of incision Present on Admission: Yes Final Assessment Date: 07/16/23 Final Assessment Time: 0945   Dressing Type Other (Comment)   medihoney on slither of gauze lightly packed into wound with 1/2 2X2 over followed by bandaid   Dressing Status Old drainage;Clean, Dry, Intact    Dressing Change Frequency --    Site / Wound Assessment Granulation tissue   healed   Margins Attached edges (approximated)    Closure Approximated    Wound Therapy - Clinical Statement see below    Wound Therapy - Functional Problem List bathing    Factors Delaying/Impairing Wound Healing Multiple medical problems;Diabetes Mellitus    Hydrotherapy Plan Debridement;Dressing change;Electrical stimulation;Patient/family education;Pulsatile lavage with suction;Ultrasonic wound therapy @35  KHz (+/- 3 KHz)    Wound Therapy - Frequency --    Wound Therapy - Current Recommendations --    Wound Plan n/a    Dressing  n/a             PATIENT EDUCATION: Education details: Patient educated scar massage, returning to PT if needed Person educated: Patient Education method: Explanation Education comprehension: verbalized understanding   HOME EXERCISE PROGRAM: N/a   GOALS: Goals reviewed with patient? Yes  SHORT TERM GOALS: Target date: 07/07/2023    Wound to decrease to at max 0.3 depth to demonstrate wound healing. Baseline: Goal status: MET    LONG TERM GOALS: Target date: 07/21/2023    Wound to be healed to reduce risk of infection Baseline:  Goal status: MET   ASSESSMENT:  CLINICAL IMPRESSION: Patient returns with wound healed today and scar tissue present. Removed small piece of peeling dry skin from scar area when assessing for wound. Patient educated on scar massage and demonstrates understanding of technique. Patient discharged from PT at this time.   OBJECTIVE IMPAIRMENTS:  decreased mobility, postural dysfunction, and skin integrity .   ACTIVITY LIMITATIONS: bathing and hygiene/grooming  PARTICIPATION LIMITATIONS: meal prep, cleaning, laundry, community activity, and yard work  PERSONAL FACTORS: Fitness and 3+ comorbidities: DM, HTN, increased BMI  are also affecting patient's functional outcome.   REHAB POTENTIAL: Good  CLINICAL DECISION MAKING: Stable/uncomplicated  EVALUATION COMPLEXITY: Low  PLAN: PT FREQUENCY: 2x/week  PT DURATION: 4 weeks  PLANNED INTERVENTIONS: Therapeutic exercises, Therapeutic activity, Neuromuscular re-education, Balance training, Gait training, Patient/Family education, Joint manipulation, Joint mobilization, Stair training, Orthotic/Fit training, DME instructions, Aquatic Therapy, Dry Needling, Electrical stimulation, Spinal manipulation, Spinal mobilization, Cryotherapy, Moist heat, Compression bandaging, scar mobilization, Splintting, Taping, Traction, Ultrasound, Ionotophoresis 4mg /ml Dexamethasone, and Manual therapy   PLAN FOR NEXT SESSION: Virgel Paling, PT 07/16/2023, 9:58 AM   07/16/2023, 9:58 AM

## 2023-07-19 ENCOUNTER — Other Ambulatory Visit (HOSPITAL_COMMUNITY): Payer: Self-pay | Admitting: Internal Medicine

## 2023-07-19 ENCOUNTER — Ambulatory Visit (HOSPITAL_COMMUNITY)
Admission: RE | Admit: 2023-07-19 | Discharge: 2023-07-19 | Disposition: A | Discharge status: Home / Self Care | Payer: BC Managed Care – PPO | Source: Ambulatory Visit | Attending: Internal Medicine | Admitting: Internal Medicine

## 2023-07-19 ENCOUNTER — Ambulatory Visit (HOSPITAL_COMMUNITY): Payer: BC Managed Care – PPO | Admitting: Physical Therapy

## 2023-07-19 DIAGNOSIS — M25561 Pain in right knee: Secondary | ICD-10-CM | POA: Insufficient documentation

## 2023-07-22 ENCOUNTER — Ambulatory Visit (HOSPITAL_COMMUNITY): Payer: BC Managed Care – PPO | Admitting: Physical Therapy

## 2023-07-26 ENCOUNTER — Ambulatory Visit (HOSPITAL_COMMUNITY): Payer: BC Managed Care – PPO | Admitting: Physical Therapy

## 2023-07-29 ENCOUNTER — Ambulatory Visit (HOSPITAL_COMMUNITY): Payer: BC Managed Care – PPO | Admitting: Physical Therapy

## 2023-07-29 ENCOUNTER — Other Ambulatory Visit: Payer: Self-pay | Admitting: Neurological Surgery

## 2023-08-10 NOTE — Pre-Procedure Instructions (Signed)
Surgical Instructions   Your procedure is scheduled on Monday, September 9th. Report to [facility] Main Entrance "A" at 05:30 A.M., then check in with the Admitting office. Any questions or running late day of surgery: call [phone]  Questions prior to your surgery date: call [phone], Monday-Friday, 8am-4pm. If you experience any cold or flu symptoms such as cough, fever, chills, shortness of breath, etc. between now and your scheduled surgery, please notify us at the above number.     Remember:  Do not eat or drink after midnight the night before your surgery     Take these medicines the morning of surgery with A SIP OF WATER  clonazePAM (KLONOPIN)  DULoxetine (CYMBALTA)  gabapentin (NEURONTIN)  methocarbamol (ROBAXIN)  pantoprazole (PROTONIX)  rosuvastatin (CRESTOR)   May take these medicines IF NEEDED:    One week prior to surgery, STOP taking any Aspirin (unless otherwise instructed by your surgeon) Aleve, Naproxen, Ibuprofen, Motrin, diclofenac sodium (VOLTAREN) gel, Advil, Goody's, BC's, all herbal medications, fish oil, and non-prescription vitamins.  WHAT DO I DO ABOUT MY DIABETES MEDICATION?   Do not take sitaGLIPtin (JANUVIA) the morning of surgery.     THE MORNING OF SURGERY, take 80 units (50%) of Insulin Degludec (TRESIBA FLEXTOUCH).  HOLD empagliflozin (JARDIANCE) 72 hours prior to surgery. Last dose 9/5  If your CBG is greater than 220 mg/dL, you may take  of your sliding scale (correction) dose of insulin aspart (NOVOLOG FLEXPEN)    HOW TO MANAGE YOUR DIABETES BEFORE AND AFTER SURGERY  Why is it important to control my blood sugar before and after surgery? Improving blood sugar levels before and after surgery helps healing and can limit problems. A way of improving blood sugar control is eating a healthy diet by:  Eating less sugar and carbohydrates  Increasing activity/exercise  Talking with your doctor about reaching your blood sugar  goals High blood sugars (greater than 180 mg/dL) can raise your risk of infections and slow your recovery, so you will need to focus on controlling your diabetes during the weeks before surgery. Make sure that the doctor who takes care of your diabetes knows about your planned surgery including the date and location.  How do I manage my blood sugar before surgery? Check your blood sugar at least 4 times a day, starting 2 days before surgery, to make sure that the level is not too high or low.  Check your blood sugar the morning of your surgery when you wake up and every 2 hours until you get to the Short Stay unit.  If your blood sugar is less than 70 mg/dL, you will need to treat for low blood sugar: Do not take insulin. Treat a low blood sugar (less than 70 mg/dL) with  cup of clear juice (cranberry or apple), 4 glucose tablets, OR glucose gel. Recheck blood sugar in 15 minutes after treatment (to make sure it is greater than 70 mg/dL). If your blood sugar is not greater than 70 mg/dL on recheck, call [phone] for further instructions. Report your blood sugar to the short stay nurse when you get to Short Stay.  If you are admitted to the hospital after surgery: Your blood sugar will be checked by the staff and you will probably be given insulin after surgery (instead of oral diabetes medicines) to make sure you have good blood sugar levels. The goal for blood sugar control after surgery is 80-180 mg/dL.  Do NOT Smoke (Tobacco/Vaping) for 24 hours prior to your procedure.  If you use a CPAP at night, you may bring your mask/headgear for your overnight stay.   You will be asked to remove any contacts, glasses, piercing's, hearing aid's, dentures/partials prior to surgery. Please bring cases for these items if needed.    Patients discharged the day of surgery will not be allowed to drive home, and someone needs to stay with them for 24 hours.  SURGICAL WAITING ROOM  VISITATION Patients may have no more than 2 support people in the waiting area - these visitors may rotate.   Pre-op nurse will coordinate an appropriate time for 1 ADULT support person, who may not rotate, to accompany patient in pre-op.  Children under the age of 54 must have an adult with them who is not the patient and must remain in the main waiting area with an adult.  If the patient needs to stay at the hospital during part of their recovery, the visitor guidelines for inpatient rooms apply.  Please refer to the [facility] website for the visitor guidelines for any additional information.   If you received a COVID test during your pre-op visit  it is requested that you wear a mask when out in public, stay away from anyone that may not be feeling well and notify your surgeon if you develop symptoms. If you have been in contact with anyone that has tested positive in the last 10 days please notify you surgeon.      Pre-operative 5 CHG Bathing Instructions   You can play a key role in reducing the risk of infection after surgery. Your skin needs to be as free of germs as possible. You can reduce the number of germs on your skin by washing with CHG (chlorhexidine gluconate) soap before surgery. CHG is an antiseptic soap that kills germs and continues to kill germs even after washing.   DO NOT use if you have an allergy to chlorhexidine/CHG or antibacterial soaps. If your skin becomes reddened or irritated, stop using the CHG and notify one of our RNs at [phone].   Please shower with the CHG soap starting 4 days before surgery using the following schedule:     Please keep in mind the following:  DO NOT shave, including legs and underarms, starting the day of your first shower.   You may shave your face at any point before/day of surgery.  Place clean sheets on your bed the day you start using CHG soap. Use a clean washcloth (not used since being washed) for each shower. DO NOT  sleep with pets once you start using the CHG.   CHG Shower Instructions:  If you choose to wash your hair and private area, wash first with your normal shampoo/soap.  After you use shampoo/soap, rinse your hair and body thoroughly to remove shampoo/soap residue.  Turn the water OFF and apply about 3 tablespoons (45 ml) of CHG soap to a CLEAN washcloth.  Apply CHG soap ONLY FROM YOUR NECK DOWN TO YOUR TOES (washing for 3-5 minutes)  DO NOT use CHG soap on face, private areas, open wounds, or sores.  Pay special attention to the area where your surgery is being performed.  If you are having back surgery, having someone wash your back for you may be helpful. Wait 2 minutes after CHG soap is applied, then you may rinse off the CHG soap.  Pat dry with a clean towel  Put on clean  clothes/pajamas   If you choose to wear lotion, please use ONLY the CHG-compatible lotions on the back of this paper.   Additional instructions for the day of surgery: DO NOT APPLY any lotions, deodorants, cologne, or perfumes.   Do not bring valuables to the hospital. [facility] is not responsible for any belongings/valuables. Do not wear nail polish, gel polish, artificial nails, or any other type of covering on natural nails (fingers and toes) Do not wear jewelry or makeup Put on clean/comfortable clothes.  Please brush your teeth.  Ask your nurse before applying any prescription medications to the skin.     CHG Compatible Lotions   Aveeno Moisturizing lotion  Cetaphil Moisturizing Cream  Cetaphil Moisturizing Lotion  Clairol Herbal Essence Moisturizing Lotion, Dry Skin  Clairol Herbal Essence Moisturizing Lotion, Extra Dry Skin  Clairol Herbal Essence Moisturizing Lotion, Normal Skin  Curel Age Defying Therapeutic Moisturizing Lotion with Alpha Hydroxy  Curel Extreme Care Body Lotion  Curel Soothing Hands Moisturizing Hand Lotion  Curel Therapeutic Moisturizing Cream, Fragrance-Free  Curel Therapeutic  Moisturizing Lotion, Fragrance-Free  Curel Therapeutic Moisturizing Lotion, Original Formula  Eucerin Daily Replenishing Lotion  Eucerin Dry Skin Therapy Plus Alpha Hydroxy Crme  Eucerin Dry Skin Therapy Plus Alpha Hydroxy Lotion  Eucerin Original Crme  Eucerin Original Lotion  Eucerin Plus Crme Eucerin Plus Lotion  Eucerin TriLipid Replenishing Lotion  Keri Anti-Bacterial Hand Lotion  Keri Deep Conditioning Original Lotion Dry Skin Formula Softly Scented  Keri Deep Conditioning Original Lotion, Fragrance Free Sensitive Skin Formula  Keri Lotion Fast Absorbing Fragrance Free Sensitive Skin Formula  Keri Lotion Fast Absorbing Softly Scented Dry Skin Formula  Keri Original Lotion  Keri Skin Renewal Lotion Keri Silky Smooth Lotion  Keri Silky Smooth Sensitive Skin Lotion  Nivea Body Creamy Conditioning Oil  Nivea Body Extra Enriched Lotion  Nivea Body Original Lotion  Nivea Body Sheer Moisturizing Lotion Nivea Crme  Nivea Skin Firming Lotion  NutraDerm 30 Skin Lotion  NutraDerm Skin Lotion  NutraDerm Therapeutic Skin Cream  NutraDerm Therapeutic Skin Lotion  ProShield Protective Hand Cream  Provon moisturizing lotion  Please read over the following fact sheets that you were given.

## 2023-08-11 ENCOUNTER — Encounter (HOSPITAL_COMMUNITY): Payer: Self-pay

## 2023-08-11 ENCOUNTER — Other Ambulatory Visit: Payer: Self-pay

## 2023-08-11 ENCOUNTER — Encounter (HOSPITAL_COMMUNITY)
Admission: RE | Admit: 2023-08-11 | Discharge: 2023-08-11 | Disposition: A | Discharge status: Home / Self Care | Payer: BC Managed Care – PPO | Source: Ambulatory Visit | Attending: Neurological Surgery | Admitting: Neurological Surgery

## 2023-08-11 VITALS — BP 118/67 | HR 81 | Temp 98.5°F | Resp 18 | Ht 63.0 in | Wt 245.7 lb

## 2023-08-11 DIAGNOSIS — Z01818 Encounter for other preprocedural examination: Secondary | ICD-10-CM

## 2023-08-11 DIAGNOSIS — Z01812 Encounter for preprocedural laboratory examination: Secondary | ICD-10-CM | POA: Diagnosis not present

## 2023-08-11 DIAGNOSIS — E119 Type 2 diabetes mellitus without complications: Secondary | ICD-10-CM | POA: Insufficient documentation

## 2023-08-11 LAB — GLUCOSE, CAPILLARY: Glucose-Capillary: 278 mg/dL — ABNORMAL HIGH (ref 70–99)

## 2023-08-11 LAB — HEMOGLOBIN A1C
Hgb A1c MFr Bld: 8.2 % — ABNORMAL HIGH (ref 4.8–5.6)
Mean Plasma Glucose: 188.64 mg/dL

## 2023-08-11 LAB — SURGICAL PCR SCREEN
MRSA, PCR: POSITIVE — AB
Staphylococcus aureus: POSITIVE — AB

## 2023-08-11 LAB — CBC
HCT: 42.7 % (ref 36.0–46.0)
Hemoglobin: 13.7 g/dL (ref 12.0–15.0)
MCH: 28.4 pg (ref 26.0–34.0)
MCHC: 32.1 g/dL (ref 30.0–36.0)
MCV: 88.6 fL (ref 80.0–100.0)
Platelets: 244 10*3/uL (ref 150–400)
RBC: 4.82 MIL/uL (ref 3.87–5.11)
RDW: 12.7 % (ref 11.5–15.5)
WBC: 7 10*3/uL (ref 4.0–10.5)
nRBC: 0 % (ref 0.0–0.2)

## 2023-08-11 LAB — BASIC METABOLIC PANEL
Anion gap: 9 (ref 5–15)
BUN: 21 mg/dL — ABNORMAL HIGH (ref 6–20)
CO2: 27 mmol/L (ref 22–32)
Calcium: 9.6 mg/dL (ref 8.9–10.3)
Chloride: 99 mmol/L (ref 98–111)
Creatinine, Ser: 1.28 mg/dL — ABNORMAL HIGH (ref 0.44–1.00)
GFR, Estimated: 50 mL/min — ABNORMAL LOW (ref >60)
Glucose, Bld: 235 mg/dL — ABNORMAL HIGH (ref 70–99)
Potassium: 4.3 mmol/L (ref 3.5–5.1)
Sodium: 135 mmol/L (ref 135–145)

## 2023-08-11 NOTE — Progress Notes (Signed)
PCP - Dr. Kathleene Hazel. Hall Cardiologist - denies  PPM/ICD - denies   Chest x-ray - 11/23/22 EKG - 04/26/23 Stress Test - 11/27/12 ECHO - 02/16/12 Cardiac Cath - denies  Sleep Study - denies   Fasting Blood Sugar - 90-125 Checks Blood Sugar continuously. Sensor on left arm today. Pt last ate around 1330. CBG was 278. Last A1C was 8.9 on 8/15. Another one was drawn today (not realizing there was a recent one drawn). Result pending. Pt has taken DM medication today. DM managed by Dr. Margo Aye  Last dose of GLP1 agonist-  n/a   ASA/Blood Thinner Instructions: n/a   ERAS Protcol - no, NPO   COVID TEST- n/a   Anesthesia review: yes, A1c 8.9 on 8/15.  Patient denies shortness of breath, fever, cough and chest pain at PAT appointment   All instructions explained to the patient, with a verbal understanding of the material. Patient agrees to go over the instructions while at home for a better understanding.  The opportunity to ask questions was provided.

## 2023-08-13 NOTE — Progress Notes (Signed)
Attempted to call Erie Noe, [place] scheduler for Dr. Maurice Small but no answer. Voicemail left with surgical PCR results +MRSA and +MSSA

## 2023-08-16 ENCOUNTER — Ambulatory Visit (HOSPITAL_COMMUNITY): Payer: BC Managed Care – PPO | Admitting: Anesthesiology

## 2023-08-16 ENCOUNTER — Other Ambulatory Visit: Payer: Self-pay

## 2023-08-16 ENCOUNTER — Ambulatory Visit (HOSPITAL_COMMUNITY): Payer: BC Managed Care – PPO | Admitting: Physician Assistant

## 2023-08-16 ENCOUNTER — Ambulatory Visit (HOSPITAL_COMMUNITY): Payer: BC Managed Care – PPO

## 2023-08-16 ENCOUNTER — Encounter (HOSPITAL_COMMUNITY)
Admission: RE | Disposition: A | Discharge status: Home / Self Care | Payer: Self-pay | Source: Home / Self Care | Attending: Neurological Surgery

## 2023-08-16 ENCOUNTER — Encounter (HOSPITAL_COMMUNITY): Payer: Self-pay | Admitting: Neurological Surgery

## 2023-08-16 ENCOUNTER — Ambulatory Visit (HOSPITAL_COMMUNITY)
Admission: RE | Admit: 2023-08-16 | Discharge: 2023-08-16 | Disposition: A | Discharge status: Home / Self Care | Payer: BC Managed Care – PPO | Attending: Neurological Surgery | Admitting: Neurological Surgery

## 2023-08-16 DIAGNOSIS — E119 Type 2 diabetes mellitus without complications: Secondary | ICD-10-CM | POA: Diagnosis not present

## 2023-08-16 DIAGNOSIS — Z794 Long term (current) use of insulin: Secondary | ICD-10-CM | POA: Insufficient documentation

## 2023-08-16 DIAGNOSIS — I10 Essential (primary) hypertension: Secondary | ICD-10-CM | POA: Insufficient documentation

## 2023-08-16 DIAGNOSIS — Z79899 Other long term (current) drug therapy: Secondary | ICD-10-CM | POA: Diagnosis not present

## 2023-08-16 DIAGNOSIS — Z01818 Encounter for other preprocedural examination: Secondary | ICD-10-CM

## 2023-08-16 DIAGNOSIS — Z6841 Body Mass Index (BMI) 40.0 and over, adult: Secondary | ICD-10-CM | POA: Diagnosis not present

## 2023-08-16 DIAGNOSIS — M4803 Spinal stenosis, cervicothoracic region: Secondary | ICD-10-CM | POA: Insufficient documentation

## 2023-08-16 DIAGNOSIS — M5412 Radiculopathy, cervical region: Secondary | ICD-10-CM | POA: Insufficient documentation

## 2023-08-16 HISTORY — PX: POSTERIOR CERVICAL LAMINECTOMY WITH MET- RX: SHX6035

## 2023-08-16 LAB — GLUCOSE, CAPILLARY: Glucose-Capillary: 126 mg/dL — ABNORMAL HIGH (ref 70–99)

## 2023-08-16 LAB — POCT PREGNANCY, URINE: Preg Test, Ur: NEGATIVE

## 2023-08-16 SURGERY — POSTERIOR CERVICAL LAMINECTOMY WITH MET- RX
Anesthesia: General | Site: Spine Cervical | Laterality: Right

## 2023-08-16 MED ORDER — LACTATED RINGERS IV SOLN: INTRAVENOUS | Status: DC

## 2023-08-16 MED ORDER — THROMBIN 5000 UNITS EX SOLR
OROMUCOSAL | Status: DC | PRN
  Administered 2023-08-16: 5 mL via TOPICAL

## 2023-08-16 MED ORDER — PHENYLEPHRINE 80 MCG/ML (10ML) SYRINGE FOR IV PUSH (FOR BLOOD PRESSURE SUPPORT)
PREFILLED_SYRINGE | INTRAVENOUS | Status: AC
  Filled 2023-08-16: qty 10

## 2023-08-16 MED ORDER — SUGAMMADEX SODIUM 200 MG/2ML IV SOLN
INTRAVENOUS | Status: DC | PRN
  Administered 2023-08-16: 250 mg via INTRAVENOUS

## 2023-08-16 MED ORDER — MIDAZOLAM HCL 2 MG/2ML IJ SOLN
INTRAMUSCULAR | Status: DC | PRN
  Administered 2023-08-16: 2 mg via INTRAVENOUS

## 2023-08-16 MED ORDER — EPHEDRINE SULFATE-NACL 50-0.9 MG/10ML-% IV SOSY
PREFILLED_SYRINGE | INTRAVENOUS | Status: DC | PRN
  Administered 2023-08-16 (×2): 5 mg via INTRAVENOUS

## 2023-08-16 MED ORDER — PHENYLEPHRINE 80 MCG/ML (10ML) SYRINGE FOR IV PUSH (FOR BLOOD PRESSURE SUPPORT)
PREFILLED_SYRINGE | INTRAVENOUS | Status: DC | PRN
  Administered 2023-08-16 (×3): 160 ug via INTRAVENOUS

## 2023-08-16 MED ORDER — THROMBIN 5000 UNITS EX SOLR
CUTANEOUS | Status: AC
  Filled 2023-08-16: qty 5000

## 2023-08-16 MED ORDER — KETAMINE HCL 50 MG/5ML IJ SOSY
PREFILLED_SYRINGE | INTRAMUSCULAR | Status: AC
  Filled 2023-08-16: qty 5

## 2023-08-16 MED ORDER — CHLORHEXIDINE GLUCONATE 0.12 % MT SOLN
15.0000 mL | Freq: Once | OROMUCOSAL | Status: AC
  Administered 2023-08-16: 15 mL via OROMUCOSAL
  Filled 2023-08-16: qty 15

## 2023-08-16 MED ORDER — 0.9 % SODIUM CHLORIDE (POUR BTL) OPTIME
TOPICAL | Status: DC | PRN
  Administered 2023-08-16: 1000 mL

## 2023-08-16 MED ORDER — ROCURONIUM BROMIDE 10 MG/ML (PF) SYRINGE
PREFILLED_SYRINGE | INTRAVENOUS | Status: AC
  Filled 2023-08-16: qty 10

## 2023-08-16 MED ORDER — HYDROMORPHONE HCL 1 MG/ML IJ SOLN
INTRAMUSCULAR | Status: AC
  Filled 2023-08-16: qty 1

## 2023-08-16 MED ORDER — ORAL CARE MOUTH RINSE: 15.0000 mL | Freq: Once | OROMUCOSAL | Status: AC

## 2023-08-16 MED ORDER — SCOPOLAMINE 1 MG/3DAYS TD PT72
MEDICATED_PATCH | TRANSDERMAL | Status: AC
  Administered 2023-08-16: 1.5 mg via TRANSDERMAL
  Filled 2023-08-16: qty 1

## 2023-08-16 MED ORDER — DEXAMETHASONE SODIUM PHOSPHATE 10 MG/ML IJ SOLN
INTRAMUSCULAR | Status: DC | PRN
  Administered 2023-08-16: 10 mg via INTRAVENOUS

## 2023-08-16 MED ORDER — KETAMINE HCL 10 MG/ML IJ SOLN
INTRAMUSCULAR | Status: DC | PRN
Start: 2023-08-16 — End: 2023-08-16
  Administered 2023-08-16: 20 mg via INTRAVENOUS

## 2023-08-16 MED ORDER — PROPOFOL 10 MG/ML IV BOLUS
INTRAVENOUS | Status: AC
  Filled 2023-08-16: qty 20

## 2023-08-16 MED ORDER — CEFAZOLIN SODIUM-DEXTROSE 2-4 GM/100ML-% IV SOLN
2.0000 g | INTRAVENOUS | Status: AC
  Administered 2023-08-16: 2 g via INTRAVENOUS
  Filled 2023-08-16: qty 100

## 2023-08-16 MED ORDER — AMISULPRIDE (ANTIEMETIC) 5 MG/2ML IV SOLN
10.0000 mg | Freq: Once | INTRAVENOUS | Status: AC | PRN
  Administered 2023-08-16: 10 mg via INTRAVENOUS

## 2023-08-16 MED ORDER — ONDANSETRON HCL 4 MG/2ML IJ SOLN
INTRAMUSCULAR | Status: AC
  Filled 2023-08-16: qty 2

## 2023-08-16 MED ORDER — LIDOCAINE-EPINEPHRINE 1 %-1:100000 IJ SOLN
INTRAMUSCULAR | Status: AC
  Filled 2023-08-16: qty 1

## 2023-08-16 MED ORDER — AMISULPRIDE (ANTIEMETIC) 5 MG/2ML IV SOLN
INTRAVENOUS | Status: AC
  Filled 2023-08-16: qty 4

## 2023-08-16 MED ORDER — LIDOCAINE 2% (20 MG/ML) 5 ML SYRINGE
INTRAMUSCULAR | Status: AC
  Filled 2023-08-16: qty 5

## 2023-08-16 MED ORDER — BACITRACIN ZINC 500 UNIT/GM EX OINT
TOPICAL_OINTMENT | CUTANEOUS | Status: AC
  Filled 2023-08-16: qty 28.35

## 2023-08-16 MED ORDER — PROPOFOL 1000 MG/100ML IV EMUL
INTRAVENOUS | Status: AC
  Filled 2023-08-16: qty 100

## 2023-08-16 MED ORDER — ONDANSETRON HCL 4 MG/2ML IJ SOLN
INTRAMUSCULAR | Status: DC | PRN
  Administered 2023-08-16: 4 mg via INTRAVENOUS

## 2023-08-16 MED ORDER — LIDOCAINE-EPINEPHRINE 1 %-1:100000 IJ SOLN
INTRAMUSCULAR | Status: DC | PRN
  Administered 2023-08-16: 10 mL

## 2023-08-16 MED ORDER — DEXAMETHASONE SODIUM PHOSPHATE 10 MG/ML IJ SOLN
INTRAMUSCULAR | Status: AC
  Filled 2023-08-16: qty 1

## 2023-08-16 MED ORDER — HYDROMORPHONE HCL 1 MG/ML IJ SOLN
0.2500 mg | INTRAMUSCULAR | Status: DC | PRN
  Administered 2023-08-16 (×4): 0.5 mg via INTRAVENOUS

## 2023-08-16 MED ORDER — METHYLPREDNISOLONE ACETATE 80 MG/ML IJ SUSP
INTRAMUSCULAR | Status: AC
  Filled 2023-08-16: qty 1

## 2023-08-16 MED ORDER — HYDROMORPHONE HCL 1 MG/ML IJ SOLN
INTRAMUSCULAR | Status: AC
  Filled 2023-08-16: qty 0.5

## 2023-08-16 MED ORDER — EPHEDRINE 5 MG/ML INJ
INTRAVENOUS | Status: AC
  Filled 2023-08-16: qty 5

## 2023-08-16 MED ORDER — VANCOMYCIN HCL 1500 MG/300ML IV SOLN
1500.0000 mg | INTRAVENOUS | Status: AC
  Administered 2023-08-16: 1500 mg via INTRAVENOUS
  Filled 2023-08-16: qty 300

## 2023-08-16 MED ORDER — CHLORHEXIDINE GLUCONATE CLOTH 2 % EX PADS: 6.0000 | MEDICATED_PAD | Freq: Once | CUTANEOUS | Status: DC

## 2023-08-16 MED ORDER — LIDOCAINE 2% (20 MG/ML) 5 ML SYRINGE
INTRAMUSCULAR | Status: DC | PRN
  Administered 2023-08-16: 60 mg via INTRAVENOUS

## 2023-08-16 MED ORDER — SCOPOLAMINE 1 MG/3DAYS TD PT72
1.0000 | MEDICATED_PATCH | TRANSDERMAL | Status: DC
  Filled 2023-08-16: qty 1

## 2023-08-16 MED ORDER — HYDROMORPHONE HCL 1 MG/ML IJ SOLN
INTRAMUSCULAR | Status: DC | PRN
Start: 2023-08-16 — End: 2023-08-16
  Administered 2023-08-16: .5 mg via INTRAVENOUS

## 2023-08-16 MED ORDER — HYDROMORPHONE HCL 2 MG PO TABS
1.0000 mg | ORAL_TABLET | ORAL | 0 refills | Status: AC | PRN
Start: 2023-08-16 — End: 2023-08-21

## 2023-08-16 MED ORDER — ACETAMINOPHEN 500 MG PO TABS
1000.0000 mg | ORAL_TABLET | Freq: Once | ORAL | Status: AC
  Administered 2023-08-16: 1000 mg via ORAL
  Filled 2023-08-16: qty 2

## 2023-08-16 MED ORDER — LACTATED RINGERS IV SOLN
INTRAVENOUS | Status: DC | PRN
Start: 2023-08-16 — End: 2023-08-16

## 2023-08-16 MED ORDER — MIDAZOLAM HCL 2 MG/2ML IJ SOLN
INTRAMUSCULAR | Status: AC
  Filled 2023-08-16: qty 2

## 2023-08-16 MED ORDER — PROPOFOL 10 MG/ML IV BOLUS
INTRAVENOUS | Status: DC | PRN
  Administered 2023-08-16: 20 mg via INTRAVENOUS
  Administered 2023-08-16: 150 mg via INTRAVENOUS
  Administered 2023-08-16: 30 mg via INTRAVENOUS

## 2023-08-16 MED ORDER — ROCURONIUM BROMIDE 10 MG/ML (PF) SYRINGE
PREFILLED_SYRINGE | INTRAVENOUS | Status: DC | PRN
  Administered 2023-08-16: 5 mg via INTRAVENOUS
  Administered 2023-08-16: 20 mg via INTRAVENOUS
  Administered 2023-08-16: 70 mg via INTRAVENOUS

## 2023-08-16 SURGICAL SUPPLY — 67 items
ADH SKN CLS APL DERMABOND .7 (GAUZE/BANDAGES/DRESSINGS) ×2
ADH SKN CLS LQ APL DERMABOND (GAUZE/BANDAGES/DRESSINGS) ×2
BAG COUNTER SPONGE SURGICOUNT (BAG) ×2 IMPLANT
BAG SPNG CNTER NS LX DISP (BAG) ×2
BLADE CLIPPER SURG (BLADE) IMPLANT
BLADE SURG 11 STRL SS (BLADE) ×2 IMPLANT
BUR 14 MATCH 3 (BUR) IMPLANT
BUR MR8 14CM BALL SYMTRI 5 (BUR) IMPLANT
BUR PRECISION FLUTE 5.0 (BURR) IMPLANT
BUR SURGICAL HEAD PROX 3X12.5 (BURR) ×2 IMPLANT
BURR 14 MATCH 3 (BUR)
BURR MR8 14CM BALL SYMTRI 5 (BUR)
BURR SURGICAL HEAD PROX 3X12.5 (BURR) ×2
CANISTER SUCT 3000ML PPV (MISCELLANEOUS) ×2 IMPLANT
COVERAGE SUPPORT O-ARM STEALTH (MISCELLANEOUS) ×2
DERMABOND ADVANCED .7 DNX12 (GAUZE/BANDAGES/DRESSINGS) ×2 IMPLANT
DERMABOND ADVANCED .7 DNX6 (GAUZE/BANDAGES/DRESSINGS) IMPLANT
DRAPE C-ARM 42X72 X-RAY (DRAPES) ×4 IMPLANT
DRAPE LAPAROTOMY 100X72 PEDS (DRAPES) ×2 IMPLANT
DRAPE MICROSCOPE SLANT 54X150 (MISCELLANEOUS) ×2 IMPLANT
DRAPE SHEET LG 3/4 BI-LAMINATE (DRAPES) ×8 IMPLANT
DRAPE SURG 17X23 STRL (DRAPES) ×2 IMPLANT
DURAPREP 26ML APPLICATOR (WOUND CARE) ×2 IMPLANT
ELECT BLADE 6.5 EXT (BLADE) ×2 IMPLANT
ELECT REM PT RETURN 9FT ADLT (ELECTROSURGICAL) ×2
ELECTRODE REM PT RTRN 9FT ADLT (ELECTROSURGICAL) ×2 IMPLANT
FEE COVERAGE SUPPORT O-ARM (MISCELLANEOUS) ×2 IMPLANT
GAUZE 4X4 16PLY ~~LOC~~+RFID DBL (SPONGE) IMPLANT
GAUZE SPONGE 4X4 12PLY STRL (GAUZE/BANDAGES/DRESSINGS) IMPLANT
GLOVE BIO SURGEON STRL SZ7.5 (GLOVE) ×4 IMPLANT
GLOVE BIOGEL PI IND STRL 7.0 (GLOVE) ×4 IMPLANT
GLOVE BIOGEL PI IND STRL 7.5 (GLOVE) ×2 IMPLANT
GLOVE ECLIPSE 7.5 STRL STRAW (GLOVE) ×2 IMPLANT
GLOVE EXAM NITRILE LRG STRL (GLOVE) IMPLANT
GLOVE EXAM NITRILE XL STR (GLOVE) IMPLANT
GLOVE EXAM NITRILE XS STR PU (GLOVE) IMPLANT
GOWN STRL REUS W/ TWL LRG LVL3 (GOWN DISPOSABLE) ×4 IMPLANT
GOWN STRL REUS W/ TWL XL LVL3 (GOWN DISPOSABLE) IMPLANT
GOWN STRL REUS W/TWL 2XL LVL3 (GOWN DISPOSABLE) IMPLANT
GOWN STRL REUS W/TWL LRG LVL3 (GOWN DISPOSABLE) ×4
GOWN STRL REUS W/TWL XL LVL3 (GOWN DISPOSABLE)
HEMOSTAT POWDER KIT SURGIFOAM (HEMOSTASIS) ×2 IMPLANT
KIT BASIN OR (CUSTOM PROCEDURE TRAY) ×2 IMPLANT
KIT TURNOVER KIT B (KITS) ×2 IMPLANT
MARKER SPHERE PSV REFLC NDI (MISCELLANEOUS) ×10 IMPLANT
NDL HYPO 18GX1.5 BLUNT FILL (NEEDLE) IMPLANT
NDL HYPO 22X1.5 SAFETY MO (MISCELLANEOUS) ×2 IMPLANT
NDL SPNL 18GX3.5 QUINCKE PK (NEEDLE) IMPLANT
NEEDLE HYPO 18GX1.5 BLUNT FILL (NEEDLE)
NEEDLE HYPO 22X1.5 SAFETY MO (MISCELLANEOUS) ×2
NEEDLE SPNL 18GX3.5 QUINCKE PK (NEEDLE)
NS IRRIG 1000ML POUR BTL (IV SOLUTION) ×2 IMPLANT
PACK LAMINECTOMY NEURO (CUSTOM PROCEDURE TRAY) ×2 IMPLANT
PAD ARMBOARD 7.5X6 YLW CONV (MISCELLANEOUS) ×6 IMPLANT
SPIKE FLUID TRANSFER (MISCELLANEOUS) ×2 IMPLANT
SPONGE T-LAP 4X18 ~~LOC~~+RFID (SPONGE) IMPLANT
SUT MNCRL AB 3-0 PS2 18 (SUTURE) IMPLANT
SUT MON AB 3-0 SH 27 (SUTURE) ×2
SUT MON AB 3-0 SH27 (SUTURE) ×2 IMPLANT
SUT VIC AB 0 CT1 18XCR BRD8 (SUTURE) ×2 IMPLANT
SUT VIC AB 0 CT1 8-18 (SUTURE) ×2
SUT VIC AB 2-0 CP2 18 (SUTURE) IMPLANT
SUT VIC AB 4-0 RB1 18 (SUTURE) IMPLANT
SYR 3ML LL SCALE MARK (SYRINGE) IMPLANT
TOWEL GREEN STERILE (TOWEL DISPOSABLE) ×2 IMPLANT
TOWEL GREEN STERILE FF (TOWEL DISPOSABLE) ×2 IMPLANT
WATER STERILE IRR 1000ML POUR (IV SOLUTION) ×2 IMPLANT

## 2023-08-16 NOTE — H&P (Signed)
Surgical H&P Update  HPI: 52 y.o. 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 Still having the above and wishes to proceed with surgery.  PMHx:  Past Medical History:  Diagnosis Date   Anxiety    Arthritis    lumbar stenosis   Complication of anesthesia    Depression    Diabetes mellitus    Family history of adverse reaction to anesthesia    family history of N&V   GERD (gastroesophageal reflux disease)    Hyperlipidemia    Hypertension    Irritable bowel syndrome    PONV (postoperative nausea and vomiting)    FamHx:  Family History  Problem Relation Age of Onset   Healthy Mother    Liver cancer Father    Healthy Son    Diabetes Brother    SocHx:  reports that she has never smoked. She has never used smokeless tobacco. She reports that she does not drink alcohol and does not use drugs.  Physical Exam: Strength 5/5 x4 and SILTx4 except R>L C8 numbness  Assesment/Plan: 52 y.o. woman with residual C7-T1 symptomatic foraminal stenosis, here for right posterior cervical MIS foraminotomy with CT guidance. Risks, benefits, and alternatives discussed and the patient would like to continue with surgery.  -OR today -home from PACU post-op  Jadene Pierini, MD 08/16/23 7:24 AM

## 2023-08-16 NOTE — Transfer of Care (Signed)
Immediate Anesthesia Transfer of Care Note  Patient: Tamara Solomon  Procedure(s) Performed: Right Cervical seven-Thoracic one minimally invasive foraminotomy with o-arm and navigation (Right: Spine Cervical) Application of O-Arm (Right)  Patient Location: PACU  Anesthesia Type:General  Level of Consciousness: drowsy  Airway & Oxygen Therapy: Patient Spontanous Breathing and Patient connected to face mask oxygen  Post-op Assessment: Report given to RN and Post -op Vital signs reviewed and stable  Post vital signs: Reviewed and stable  Last Vitals:  Vitals Value Taken Time  BP 124/74 08/16/23 1000  Temp 36.5 C 08/16/23 1000  Pulse 74 08/16/23 1001  Resp 11 08/16/23 1001  SpO2 97 % 08/16/23 1001  Vitals shown include unfiled device data.  Last Pain:  Vitals:   08/16/23 1000  TempSrc:   PainSc: Asleep         Complications: No notable events documented.  *Report given to PACU RN for continued care and followup.

## 2023-08-16 NOTE — Anesthesia Postprocedure Evaluation (Signed)
Anesthesia Post Note  Patient: Tamara Solomon  Procedure(s) Performed: Right Cervical seven-Thoracic one minimally invasive foraminotomy with o-arm and navigation (Right: Spine Cervical) Application of O-Arm (Right)     Patient location during evaluation: PACU Anesthesia Type: General Level of consciousness: awake and alert Pain management: pain level controlled Vital Signs Assessment: post-procedure vital signs reviewed and stable Respiratory status: spontaneous breathing, nonlabored ventilation and respiratory function stable Cardiovascular status: blood pressure returned to baseline and stable Postop Assessment: no apparent nausea or vomiting Anesthetic complications: no  No notable events documented.  Last Vitals:  Vitals:   08/16/23 1115 08/16/23 1130  BP: 114/69 111/64  Pulse: 79 79  Resp: 12 10  Temp:  36.5 C  SpO2: 91% 95%    Last Pain:  Vitals:   08/16/23 1130  TempSrc:   PainSc: 2                  Kierstan Auer,W. EDMOND

## 2023-08-16 NOTE — Anesthesia Procedure Notes (Addendum)
Procedure Name: Intubation Date/Time: 08/16/2023 7:43 AM  Performed by: Earlene Plater, CRNAPre-anesthesia Checklist: Emergency Drugs available, Suction available, Patient identified, Timeout performed and Patient being monitored Patient Re-evaluated:Patient Re-evaluated prior to induction Oxygen Delivery Method: Circle system utilized Preoxygenation: Pre-oxygenation with 100% oxygen Induction Type: IV induction Ventilation: Mask ventilation without difficulty and Oral airway inserted - appropriate to patient size Laryngoscope Size: 4 and Glidescope Grade View: Grade I Tube type: Oral Tube size: 7.5 mm Number of attempts: 1 Airway Equipment and Method: Stylet and Bite block Placement Confirmation: ETT inserted through vocal cords under direct vision, positive ETCO2, CO2 detector and breath sounds checked- equal and bilateral Secured at: 21 (@ lips) cm Tube secured with: Tape Dental Injury: Teeth and Oropharynx as per pre-operative assessment  Comments: Glidescope utilized d/t neck mobility.

## 2023-08-16 NOTE — Op Note (Signed)
PATIENT: Tamara Solomon  DAY OF SURGERY: 08/16/23   PRE-OPERATIVE DIAGNOSIS:  Left C8 radiculopathy   POST-OPERATIVE DIAGNOSIS:  Left C8 radiculopathy   PROCEDURE:  Left C7-T1 minimally invasive posterior keyhole foraminotomy, intra-operative CT with frameless stereotactic navigation   SURGEON:  Surgeon(s) and Role:    Jadene Pierini, MD - Primary    Emilee Hero PA - Assistant   ANESTHESIA: ETGA   BRIEF HISTORY: This is a 52 year old woman in whom I previously treated her cervical disease from C4-C7. We had planned to treat her C7-T1 foraminal disease in that setting but, due to her body habitus, were unable to reach that level anteriorly. She remained symptomatic from her foraminal disease at C7-T1 on the right. Given the difficulty in localizing with plain films, which is well visualized on her original intra-op films, I recommended CT guidance for a posterior cervical MIS foraminotomy to make sure we can localize accurately. We discussed that the above features do still make this surgery much more difficult, but I think this is the best treatment option for her 7-1 disease. This was discussed with the patient as well as risks, benefits, and alternatives and wished to proceed with surgical treatment.   OPERATIVE DETAIL: The patient was taken to the operating room and placed on the OR table in the prone position. A formal time out was performed with two patient identifiers and confirmed the operative site. Anesthesia was induced by the anesthesia team. A radiolucent head holder was applied and the patient was placed prone on the OR table and secured. A registration array was attached and secured and the O-arm was brought into the field. A CT was performed using the O-arm and transferred to the Stealth navigation and planning station. Stereotactic navigation was used, as described above, due to inability to use plain fluoroscopy to reliably localize due to the patient's habitus.  Frameless stereotactic navigation was used for docking the tube, confirming the correct foramen, and aiding in evaluating the lateral extent of the foraminotomy, as well as using standard visible landmarks.  The O-arm was taken out of the field and the posterior cervical region was prepped and draped in a sterile fashion. Using navigating instruments, the C7-T1 segment was identified. A right paramedian incision was created and this was used to dock a MetRx tube to the right 7-1 lamino-facet interface and secured. The operating microscope was brought into the field and soft tissues were dissected to confirm landmarks. As expected, this was quite a deep corridor, but I was able to get good visualization and dissection of the lamino-facet interface and used navigation to confirm the correct level again prior to starting the foraminotomy.  A foraminotomy was then performed with a combination of high speed drill and rongeurs. The foramen was then palpated along its course and it was confirmed that there was no residual stenosis in the foramen. The wound was copiously irrigated and hemostasis was confirmed. The tubular retractor was slowly removed and muscle was coagulated as needed. The incision was then closed in layers and the patient was returned to anesthesia for emergence. All counts were correct x2.  Tamara Consentino PA was scrubbed and assisted with the entire procedure which included exposure, foraminotomy and closure.    EBL:  25mL   DRAINS: none   SPECIMENS: none  Jadene Pierini, MD 08/16/23 7:26 AM

## 2023-08-16 NOTE — Anesthesia Preprocedure Evaluation (Addendum)
Anesthesia Evaluation  Patient identified by MRN, date of birth, ID band Patient awake    Reviewed: Allergy & Precautions, H&P , NPO status , Patient's Chart, lab work & pertinent test results  History of Anesthesia Complications (+) PONV and history of anesthetic complications  Airway Mallampati: II  TM Distance: >3 FB Neck ROM: Full    Dental no notable dental hx. (+) Teeth Intact, Dental Advisory Given   Pulmonary neg pulmonary ROS   Pulmonary exam normal breath sounds clear to auscultation       Cardiovascular hypertension, Pt. on medications  Rhythm:Regular Rate:Normal     Neuro/Psych  Headaches  Anxiety Depression       GI/Hepatic Neg liver ROS,GERD  Medicated,,  Endo/Other  diabetes, Type 2, Insulin Dependent  Morbid obesity  Renal/GU negative Renal ROS  negative genitourinary   Musculoskeletal  (+) Arthritis , Osteoarthritis,    Abdominal   Peds  Hematology negative hematology ROS (+)   Anesthesia Other Findings   Reproductive/Obstetrics negative OB ROS                             Anesthesia Physical Anesthesia Plan  ASA: 3  Anesthesia Plan: General   Post-op Pain Management: Tylenol PO (pre-op)*   Induction: Intravenous  PONV Risk Score and Plan: 4 or greater and Ondansetron, Dexamethasone, Midazolam and Scopolamine patch - Pre-op  Airway Management Planned: Oral ETT  Additional Equipment:   Intra-op Plan:   Post-operative Plan: Extubation in OR  Informed Consent: I have reviewed the patients History and Physical, chart, labs and discussed the procedure including the risks, benefits and alternatives for the proposed anesthesia with the patient or authorized representative who has indicated his/her understanding and acceptance.     Dental advisory given  Plan Discussed with: CRNA  Anesthesia Plan Comments:        Anesthesia Quick Evaluation

## 2023-08-17 ENCOUNTER — Encounter (HOSPITAL_COMMUNITY): Payer: Self-pay | Admitting: Neurological Surgery

## 2023-08-17 LAB — GLUCOSE, CAPILLARY: Glucose-Capillary: 183 mg/dL — ABNORMAL HIGH (ref 70–99)

## 2023-08-17 IMAGING — MR MR PELVIS W/O CM
5 of 6 series · 33 of 48 positions shown · non-contrast
Comparison: None.

CLINICAL DATA: Right-sided buttock mass.

EXAM:
MRI PELVIS WITHOUT CONTRAST
TECHNIQUE: Multiplanar multisequence MR imaging of the pelvis was performed. No
intravenous contrast was administered.

[Series 4: T1 · coronal · 4.0mm · 1.56mm/px · 7 of 45 slices shown (1 of 2)]
[im 1/45]
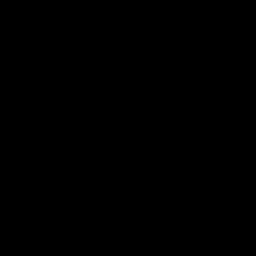
[im 8/45]
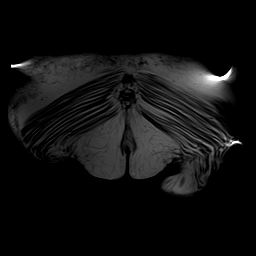
[im 15/45]
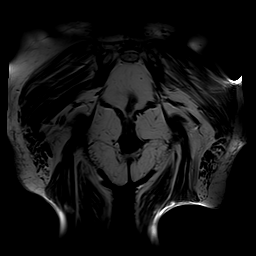
[im 23/45]
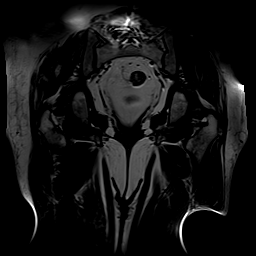
[im 30/45]
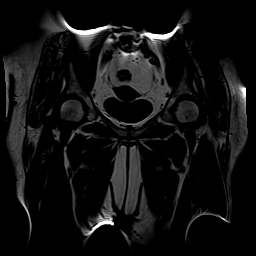
[im 37/45]
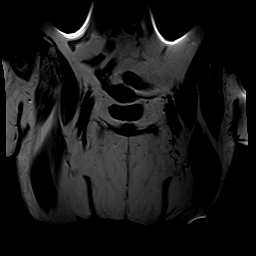
[im 45/45]
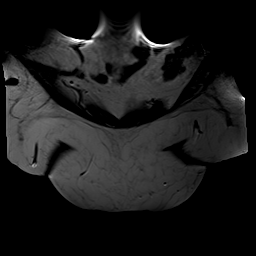

[Series 5: STIR · coronal · 4.0mm · 0.78mm/px · 7 of 45 slices shown]
[im 1/45]
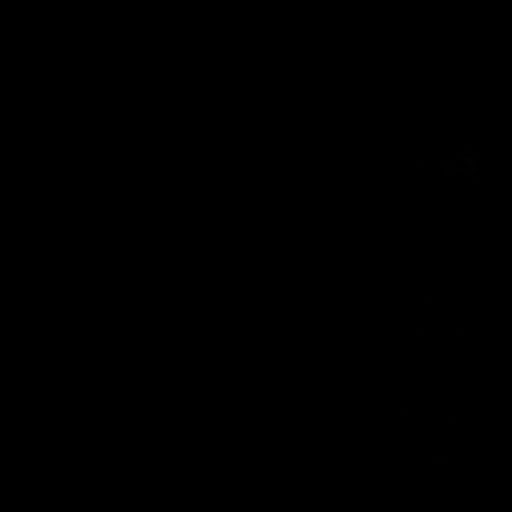
[im 8/45]
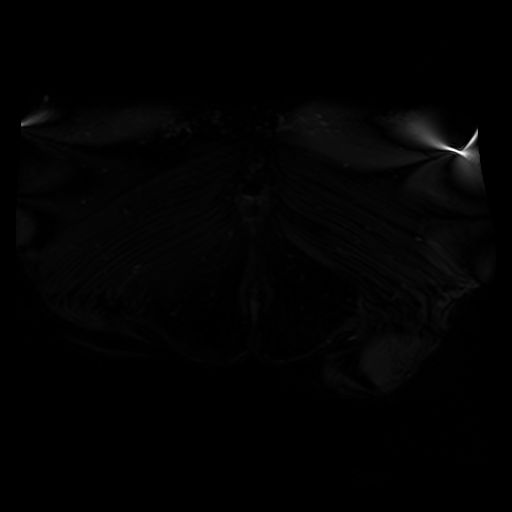
[im 15/45]
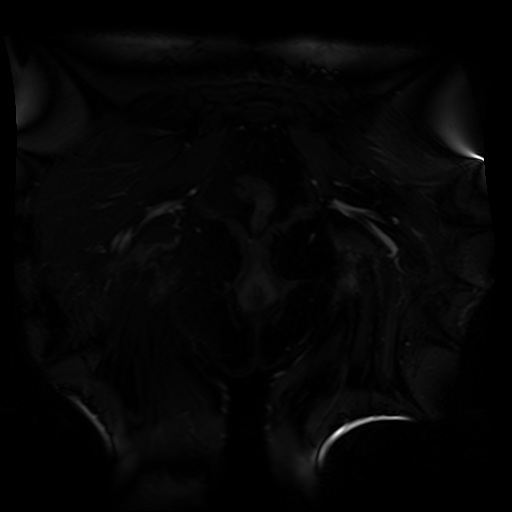
[im 23/45]
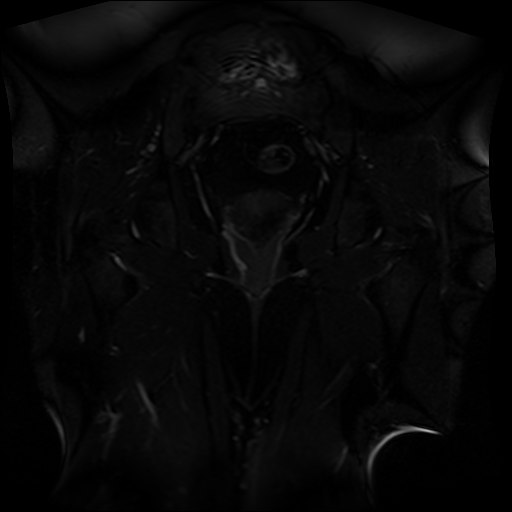
[im 30/45]
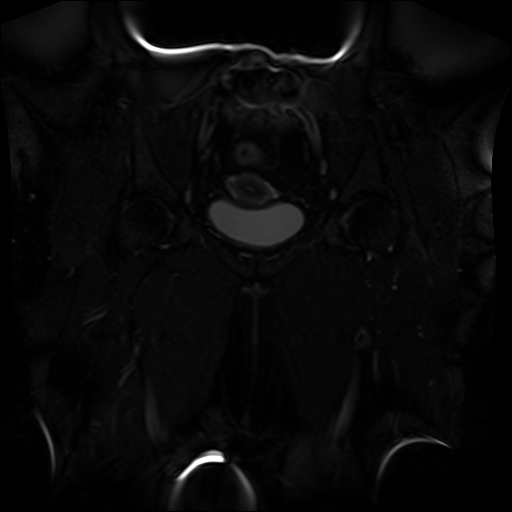
[im 37/45]
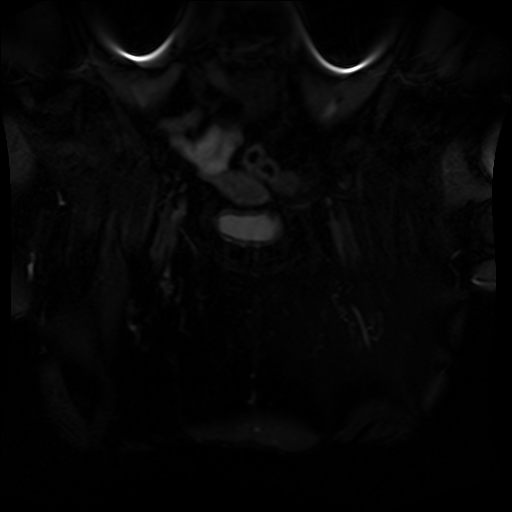
[im 45/45]
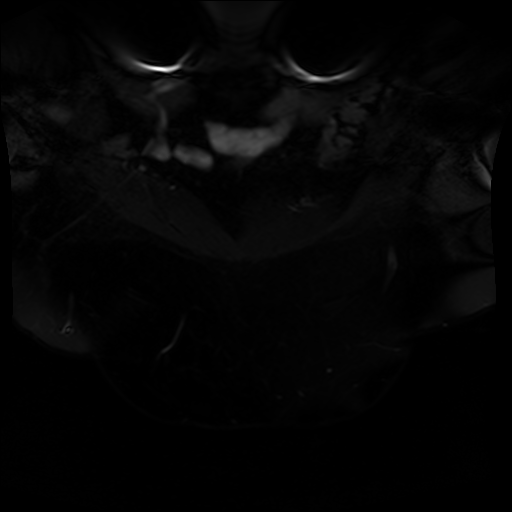

[Series 6: T1 · axial · 4.0mm · 1.25mm/px · z∈[-156,+99]mm · 8 of 49 slices shown (2 of 2)]
[im 1/49]
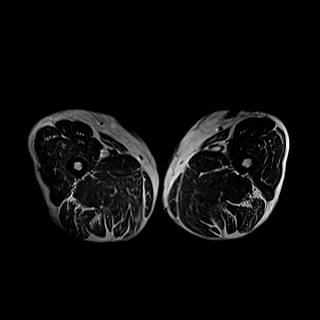
[im 7/49]
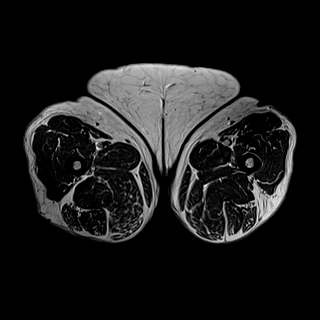
[im 14/49]
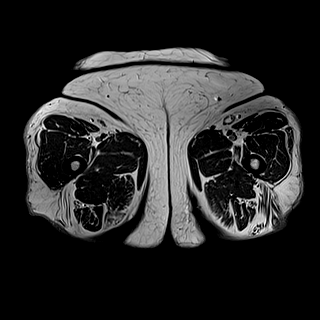
[im 21/49]
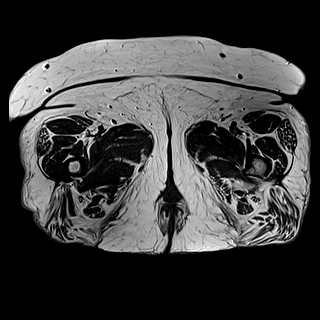
[im 28/49]
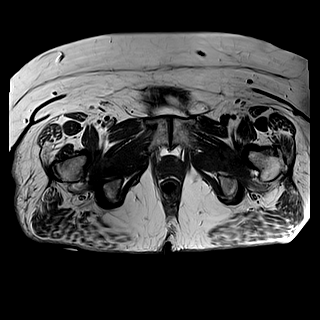
[im 35/49]
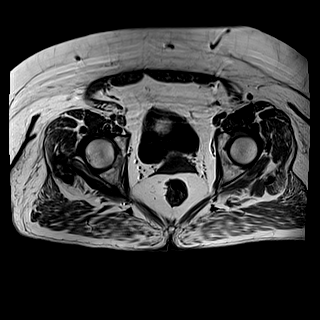
[im 42/49]
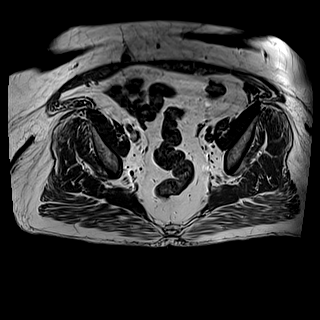
[im 49/49]
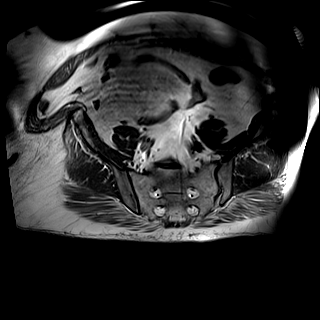

[Series 7: T2 fat-sat · axial · 4.0mm · 1.25mm/px · z∈[-156,+99]mm · 8 of 49 slices shown (1 of 2)]
[im 1/49]
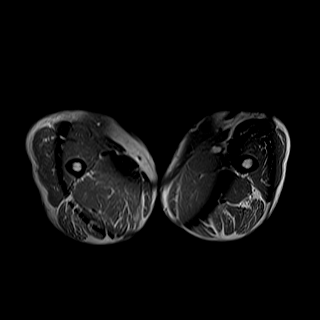
[im 7/49]
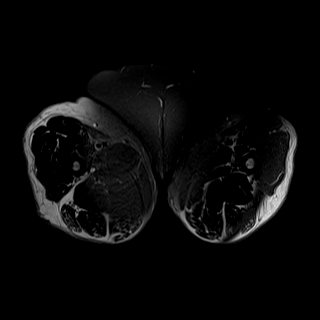
[im 14/49]
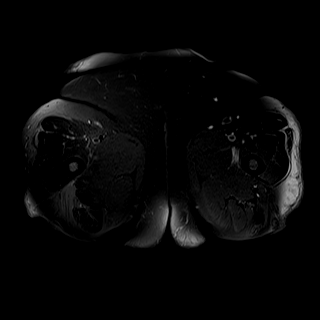
[im 21/49]
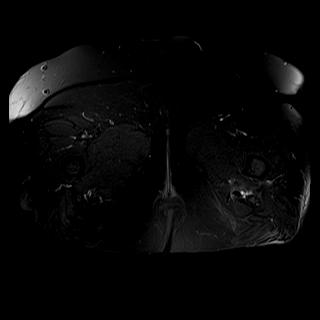
[im 28/49]
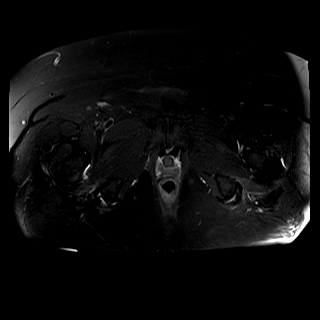
[im 35/49]
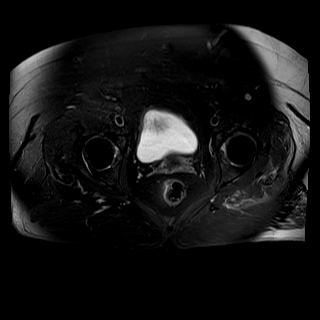
[im 42/49]
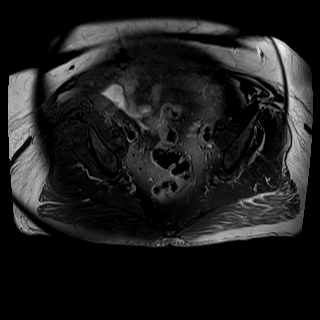
[im 49/49]
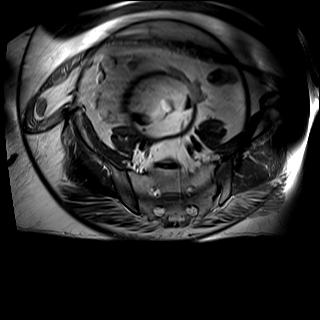

[Series 8: T2 fat-sat · sagittal · 4.0mm · 0.70mm/px · 3 of 59 slices shown (2 of 2)]
[im 1/59]
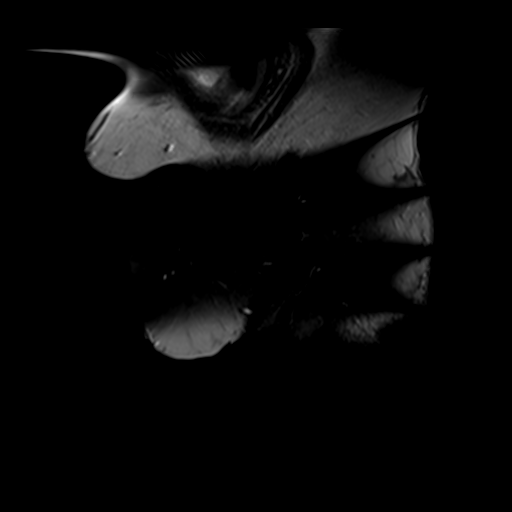
[im 7/59]
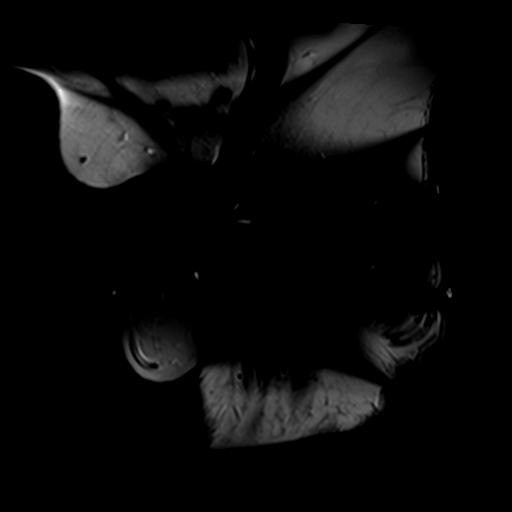
[im 20/59]
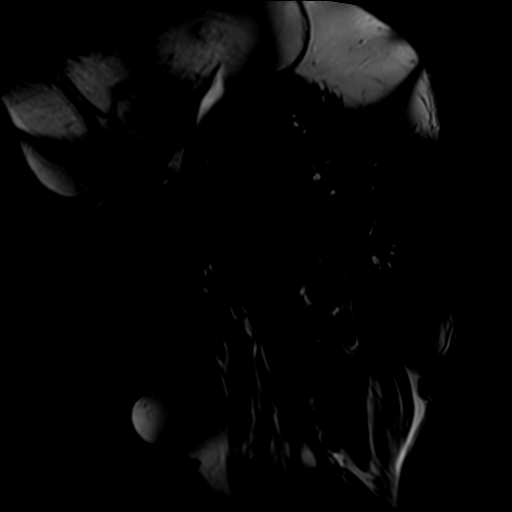

[33 of 48 positions shown; findings below may reference images not displayed]

FINDINGS: Urinary Tract: The bladder is unremarkable. No bladder mass or
calculi.

Bowel: The rectum, sigmoid colon and visualized small-bowel loops
are grossly normal.

Vascular/Lymphatic: No aneurysm adenopathy.

Reproductive:  The uterus and ovaries are unremarkable.

Other:  No free pelvic fluid collections, pelvic mass or adenopathy.

No subcutaneous mass is identified in the region of the right
buttock area. There is diffuse but symmetric appearing fatty change
involving the gluteus maximus muscles bilaterally. No muscle mass or
lipoma. No muscle tear or intramuscular hematoma.

Musculoskeletal: Both hips are normally located. Mild degenerative
changes bilaterally but no stress fracture or AVN. No hip joint
effusion or periarticular fluid collections. Mild bilateral Tiger
tendinosis without trochanteric bursitis.

There is bilateral hamstring tendinopathy with thickening, small
interstitial tears and some surrounding inflammation. No complete
tear/rupture. On the left side there is edema like signal changes in
the quadratus femoris between the ischium and the femur suggesting
ischial femoral impingement syndrome.
IMPRESSION: 1. No soft tissue mass in the subcutaneous fat or underlying gluteus
maximus muscle in the right buttock area is identified.
2. Mild bilateral hip joint degenerative changes but no stress
fracture or AVN.
3. Bilateral hamstring tendinopathy with thickening, small
interstitial tears and some surrounding inflammation. No complete
tear/rupture.
4. Edema like signal changes in the left quadratus femoris muscle
between the ischium and the femur suggesting ischiofemoral
impingement syndrome.

## 2023-10-26 NOTE — Pre-Procedure Instructions (Signed)
Surgical Instructions   Your procedure is scheduled on Tuesday, November 26th. Report to [facility] Main Entrance "A" at 3:00 P.M., then check in with the Admitting office. Any questions or running late day of surgery: call [phone]  Questions prior to your surgery date: call [phone], Monday-Friday, 8am-4pm. If you experience any cold or flu symptoms such as cough, fever, chills, shortness of breath, etc. between now and your scheduled surgery, please notify us at the above number.     Remember:  Do not eat after midnight the night before your surgery  You may drink clear liquids until 2:00 PM the day of your surgery.   Clear liquids allowed are: Water, Non-Citrus Juices (without pulp), Carbonated Beverages, Clear Tea (no milk, honey, etc.), Black Coffee Only (NO MILK, CREAM OR POWDERED CREAMER of any kind), and Gatorade.  Patient Instructions  The night before surgery:  No food after midnight. ONLY clear liquids after midnight  The day of surgery (if you have diabetes): Drink ONE (1) 12 oz G2 given to you in your pre admission testing appointment by 2:00 PM the day of surgery. Drink in one sitting. Do not sip.  This drink was given to you during your hospital  pre-op appointment visit.  Nothing else to drink after completing the  12 oz bottle of G2.         If you have questions, please contact your surgeon's office.    Take these medicines the morning of surgery with A SIP OF WATER  clonazePAM (KLONOPIN)  DULoxetine (CYMBALTA)  gabapentin (NEURONTIN)  methocarbamol (ROBAXIN)  pantoprazole (PROTONIX)  rosuvastatin (CRESTOR)     One week prior to surgery, STOP taking any Aspirin (unless otherwise instructed by your surgeon) Aleve, Naproxen, Ibuprofen, Motrin, Advil, Goody's, BC's, all herbal medications, fish oil, and non-prescription vitamins.  WHAT DO I DO ABOUT MY DIABETES MEDICATION?   Do not take sitaGLIPtin (JANUVIA) the morning of surgery.  THE MORNING  OF SURGERY, take 80 units (50%) of Insulin Degludec (TRESIBA FLEXTOUCH).  Hold empagliflozin (JARDIANCE) for 72 hours prior to surgery. Last dose 11/22.  If your CBG is greater than 220 mg/dL, you may take  of your sliding scale (correction) dose of insulin aspart (NOVOLOG FLEXPEN).   HOW TO MANAGE YOUR DIABETES BEFORE AND AFTER SURGERY  Why is it important to control my blood sugar before and after surgery? Improving blood sugar levels before and after surgery helps healing and can limit problems. A way of improving blood sugar control is eating a healthy diet by:  Eating less sugar and carbohydrates  Increasing activity/exercise  Talking with your doctor about reaching your blood sugar goals High blood sugars (greater than 180 mg/dL) can raise your risk of infections and slow your recovery, so you will need to focus on controlling your diabetes during the weeks before surgery. Make sure that the doctor who takes care of your diabetes knows about your planned surgery including the date and location.  How do I manage my blood sugar before surgery? Check your blood sugar at least 4 times a day, starting 2 days before surgery, to make sure that the level is not too high or low.  Check your blood sugar the morning of your surgery when you wake up and every 2 hours until you get to the Short Stay unit.  If your blood sugar is less than 70 mg/dL, you will need to treat for low blood sugar: Do not take insulin. Treat a low blood sugar (less  than 70 mg/dL) with  cup of clear juice (cranberry or apple), 4 glucose tablets, OR glucose gel. Recheck blood sugar in 15 minutes after treatment (to make sure it is greater than 70 mg/dL). If your blood sugar is not greater than 70 mg/dL on recheck, call [phone] for further instructions. Report your blood sugar to the short stay nurse when you get to Short Stay.  If you are admitted to the hospital after surgery: Your blood sugar will be checked  by the staff and you will probably be given insulin after surgery (instead of oral diabetes medicines) to make sure you have good blood sugar levels. The goal for blood sugar control after surgery is 80-180 mg/dL.                     Do NOT Smoke (Tobacco/Vaping) for 24 hours prior to your procedure.  If you use a CPAP at night, you may bring your mask/headgear for your overnight stay.   You will be asked to remove any contacts, glasses, piercing's, hearing aid's, dentures/partials prior to surgery. Please bring cases for these items if needed.    Patients discharged the day of surgery will not be allowed to drive home, and someone needs to stay with them for 24 hours.  SURGICAL WAITING ROOM VISITATION Patients may have no more than 2 support people in the waiting area - these visitors may rotate.   Pre-op nurse will coordinate an appropriate time for 1 ADULT support person, who may not rotate, to accompany patient in pre-op.  Children under the age of 21 must have an adult with them who is not the patient and must remain in the main waiting area with an adult.  If the patient needs to stay at the hospital during part of their recovery, the visitor guidelines for inpatient rooms apply.  Please refer to the [facility] website for the visitor guidelines for any additional information.   If you received a COVID test during your pre-op visit  it is requested that you wear a mask when out in public, stay away from anyone that may not be feeling well and notify your surgeon if you develop symptoms. If you have been in contact with anyone that has tested positive in the last 10 days please notify you surgeon.      Pre-operative 5 CHG Bathing Instructions   You can play a key role in reducing the risk of infection after surgery. Your skin needs to be as free of germs as possible. You can reduce the number of germs on your skin by washing with CHG (chlorhexidine gluconate) soap before surgery.  CHG is an antiseptic soap that kills germs and continues to kill germs even after washing.   DO NOT use if you have an allergy to chlorhexidine/CHG or antibacterial soaps. If your skin becomes reddened or irritated, stop using the CHG and notify one of our RNs at [phone].   Please shower with the CHG soap starting 4 days before surgery using the following schedule:     Please keep in mind the following:  DO NOT shave, including legs and underarms, starting the day of your first shower.   You may shave your face at any point before/day of surgery.  Place clean sheets on your bed the day you start using CHG soap. Use a clean washcloth (not used since being washed) for each shower. DO NOT sleep with pets once you start using the CHG.   CHG  Shower Instructions:  Oncologist and private area with normal soap. If you choose to wash your hair, wash first with your normal shampoo.  After you use shampoo/soap, rinse your hair and body thoroughly to remove shampoo/soap residue.  Turn the water OFF and apply about 3 tablespoons (45 ml) of CHG soap to a CLEAN washcloth.  Apply CHG soap ONLY FROM YOUR NECK DOWN TO YOUR TOES (washing for 3-5 minutes)  DO NOT use CHG soap on face, private areas, open wounds, or sores.  Pay special attention to the area where your surgery is being performed.  If you are having back surgery, having someone wash your back for you may be helpful. Wait 2 minutes after CHG soap is applied, then you may rinse off the CHG soap.  Pat dry with a clean towel  Put on clean clothes/pajamas   If you choose to wear lotion, please use ONLY the CHG-compatible lotions on the back of this paper.   Additional instructions for the day of surgery: DO NOT APPLY any lotions, deodorants, cologne, or perfumes.   Do not bring valuables to the hospital. [facility] is not responsible for any belongings/valuables. Do not wear nail polish, gel polish, artificial nails, or any other type  of covering on natural nails (fingers and toes) Do not wear jewelry or makeup Put on clean/comfortable clothes.  Please brush your teeth.  Ask your nurse before applying any prescription medications to the skin.     CHG Compatible Lotions   Aveeno Moisturizing lotion  Cetaphil Moisturizing Cream  Cetaphil Moisturizing Lotion  Clairol Herbal Essence Moisturizing Lotion, Dry Skin  Clairol Herbal Essence Moisturizing Lotion, Extra Dry Skin  Clairol Herbal Essence Moisturizing Lotion, Normal Skin  Curel Age Defying Therapeutic Moisturizing Lotion with Alpha Hydroxy  Curel Extreme Care Body Lotion  Curel Soothing Hands Moisturizing Hand Lotion  Curel Therapeutic Moisturizing Cream, Fragrance-Free  Curel Therapeutic Moisturizing Lotion, Fragrance-Free  Curel Therapeutic Moisturizing Lotion, Original Formula  Eucerin Daily Replenishing Lotion  Eucerin Dry Skin Therapy Plus Alpha Hydroxy Crme  Eucerin Dry Skin Therapy Plus Alpha Hydroxy Lotion  Eucerin Original Crme  Eucerin Original Lotion  Eucerin Plus Crme Eucerin Plus Lotion  Eucerin TriLipid Replenishing Lotion  Keri Anti-Bacterial Hand Lotion  Keri Deep Conditioning Original Lotion Dry Skin Formula Softly Scented  Keri Deep Conditioning Original Lotion, Fragrance Free Sensitive Skin Formula  Keri Lotion Fast Absorbing Fragrance Free Sensitive Skin Formula  Keri Lotion Fast Absorbing Softly Scented Dry Skin Formula  Keri Original Lotion  Keri Skin Renewal Lotion Keri Silky Smooth Lotion  Keri Silky Smooth Sensitive Skin Lotion  Nivea Body Creamy Conditioning Oil  Nivea Body Extra Enriched Lotion  Nivea Body Original Lotion  Nivea Body Sheer Moisturizing Lotion Nivea Crme  Nivea Skin Firming Lotion  NutraDerm 30 Skin Lotion  NutraDerm Skin Lotion  NutraDerm Therapeutic Skin Cream  NutraDerm Therapeutic Skin Lotion  ProShield Protective Hand Cream  Provon moisturizing lotion  Please read over the following fact sheets  that you were given.

## 2023-10-27 ENCOUNTER — Encounter (HOSPITAL_COMMUNITY)
Admission: RE | Admit: 2023-10-27 | Discharge: 2023-10-27 | Disposition: A | Discharge status: Home / Self Care | Payer: BC Managed Care – PPO | Source: Ambulatory Visit | Attending: Orthopedic Surgery | Admitting: Orthopedic Surgery

## 2023-10-27 ENCOUNTER — Encounter (HOSPITAL_COMMUNITY): Payer: Self-pay

## 2023-10-27 ENCOUNTER — Other Ambulatory Visit: Payer: Self-pay

## 2023-10-27 VITALS — BP 136/73 | HR 80 | Temp 97.8°F | Resp 18 | Ht 63.0 in | Wt 240.4 lb

## 2023-10-27 DIAGNOSIS — E119 Type 2 diabetes mellitus without complications: Secondary | ICD-10-CM | POA: Diagnosis not present

## 2023-10-27 DIAGNOSIS — K589 Irritable bowel syndrome without diarrhea: Secondary | ICD-10-CM | POA: Insufficient documentation

## 2023-10-27 DIAGNOSIS — E785 Hyperlipidemia, unspecified: Secondary | ICD-10-CM | POA: Insufficient documentation

## 2023-10-27 DIAGNOSIS — K219 Gastro-esophageal reflux disease without esophagitis: Secondary | ICD-10-CM | POA: Diagnosis not present

## 2023-10-27 DIAGNOSIS — Z6841 Body Mass Index (BMI) 40.0 and over, adult: Secondary | ICD-10-CM | POA: Insufficient documentation

## 2023-10-27 DIAGNOSIS — Z01812 Encounter for preprocedural laboratory examination: Secondary | ICD-10-CM | POA: Diagnosis present

## 2023-10-27 DIAGNOSIS — Z01818 Encounter for other preprocedural examination: Secondary | ICD-10-CM

## 2023-10-27 DIAGNOSIS — I10 Essential (primary) hypertension: Secondary | ICD-10-CM | POA: Insufficient documentation

## 2023-10-27 DIAGNOSIS — Z794 Long term (current) use of insulin: Secondary | ICD-10-CM | POA: Insufficient documentation

## 2023-10-27 DIAGNOSIS — M7701 Medial epicondylitis, right elbow: Secondary | ICD-10-CM | POA: Insufficient documentation

## 2023-10-27 LAB — CBC
HCT: 41.7 % (ref 36.0–46.0)
Hemoglobin: 13.5 g/dL (ref 12.0–15.0)
MCH: 28.7 pg (ref 26.0–34.0)
MCHC: 32.4 g/dL (ref 30.0–36.0)
MCV: 88.5 fL (ref 80.0–100.0)
Platelets: 281 10*3/uL (ref 150–400)
RBC: 4.71 MIL/uL (ref 3.87–5.11)
RDW: 12.9 % (ref 11.5–15.5)
WBC: 8 10*3/uL (ref 4.0–10.5)
nRBC: 0 % (ref 0.0–0.2)

## 2023-10-27 LAB — COMPREHENSIVE METABOLIC PANEL
ALT: 27 U/L (ref 0–44)
AST: 25 U/L (ref 15–41)
Albumin: 3.9 g/dL (ref 3.5–5.0)
Alkaline Phosphatase: 84 U/L (ref 38–126)
Anion gap: 9 (ref 5–15)
BUN: 14 mg/dL (ref 6–20)
CO2: 26 mmol/L (ref 22–32)
Calcium: 9.4 mg/dL (ref 8.9–10.3)
Chloride: 101 mmol/L (ref 98–111)
Creatinine, Ser: 0.86 mg/dL (ref 0.44–1.00)
GFR, Estimated: >60 mL/min (ref >60)
Glucose, Bld: 291 mg/dL — ABNORMAL HIGH (ref 70–99)
Potassium: 3.6 mmol/L (ref 3.5–5.1)
Sodium: 136 mmol/L (ref 135–145)
Total Bilirubin: 0.6 mg/dL (ref <1.2)
Total Protein: 6.4 g/dL — ABNORMAL LOW (ref 6.5–8.1)

## 2023-10-27 LAB — GLUCOSE, CAPILLARY: Glucose-Capillary: 296 mg/dL — ABNORMAL HIGH (ref 70–99)

## 2023-10-27 LAB — HEMOGLOBIN A1C
Hgb A1c MFr Bld: 8.3 % — ABNORMAL HIGH (ref 4.8–5.6)
Mean Plasma Glucose: 191.51 mg/dL

## 2023-10-27 LAB — SURGICAL PCR SCREEN
MRSA, PCR: POSITIVE — AB
Staphylococcus aureus: POSITIVE — AB

## 2023-10-27 NOTE — Progress Notes (Signed)
PCP - Dr. Nita Sells Cardiologist - denies  PPM/ICD - denies Device Orders - na Rep Notified - na  Chest x-ray - na EKG - 04/26/2023 Stress Test - 11/27/2012 ECHO - 02/16/2012 Cardiac Cath -   Sleep Study - denies CPAP - na  Type II diabetic, BS was 296 in PAT, ate at 1330 Fasting Blood Sugar - 120 Checks Blood Sugar three times a week.   Last dose of GLP1 agonist-  Jardiance, last dose 10/23/2023 GLP1 instructions:  Blood Thinner Instructions:denies Aspirin Instructions:denies  ERAS Protcol -G2 until 1400  COVID TEST- na  Anesthesia review: Yes. HTN, DM, heart history  Patient denies shortness of breath, fever, cough and chest pain at PAT appointment   All instructions explained to the patient, with a verbal understanding of the material. Patient agrees to go over the instructions while at home for a better understanding. Patient also instructed to self quarantine after being tested for COVID-19. The opportunity to ask questions was provided.

## 2023-10-28 NOTE — Progress Notes (Signed)
Anesthesia Chart Review:  Case: [id number] Date/Time: 11/02/23 1645   Procedure: Right elbow ulnar nerve decompression and anterior transposition with flexor pronator lengthening and medial epicondyle debridement and repair as necessary (Right) - 90 MIN   Anesthesia type: General   Pre-op diagnosis: Right elbow pain with medial epicondylitis and ulnar nerve compression   [facility]OM 09 / [facility]   Surgeons: Dominica Severin, MD       DISCUSSION: Patient is a 52 year old female scheduled for the above procedure.  History includes never smoker, post-operative N/V, DM2, HTN, GERD, HLD, IBS, liver surgery (not otherwise specified, 2000; benign appearing hepatic masses, mild hepatic steatosis 12/2018 MRI), spinal surgery (L5-S1 PLIF 05/29/19; C4-7 ACDF 05/04/23; right C7-T1 foraminotomy). BMI is consistent with morbid obesity.   A1c 8.3% on 10/27/23. Reported typical fasting glucose around 120's. She is on Januvia 100 mg daily, Tresiba 160 units/mL 160 units Q AM, Novolog 30 units BID, Jardiance 25 mg daily (to hold 72 hours prior to surgery).    04/26/23 EKG showed normal sinus rhythm, low voltage.     Anesthesia team to evaluate on the day of surgery. She will need a CBG and urine pregnancy test on arrival.   VS: BP 136/73   Pulse 80   Temp 36.6 C (Oral)   Resp 18   Ht 5\' 3"  (1.6 m)   Wt 109 kg   LMP 08/18/2023 (Approximate)   SpO2 100%   BMI 42.58 kg/m   PROVIDERS: Benita Stabile, MD is PCP    LABS: Preoperative labs noted.  (all labs ordered are listed, but only abnormal results are displayed)  Labs Reviewed  SURGICAL PCR SCREEN - Abnormal; Notable for the following components:      Result Value   MRSA, PCR POSITIVE (*)    Staphylococcus aureus POSITIVE (*)    All other components within normal limits  HEMOGLOBIN A1C - Abnormal; Notable for the following components:   Hgb A1c MFr Bld 8.3 (*)    All other components within normal limits  COMPREHENSIVE METABOLIC PANEL -  Abnormal; Notable for the following components:   Glucose, Bld 291 (*)    Total Protein 6.4 (*)    All other components within normal limits  GLUCOSE, CAPILLARY - Abnormal; Notable for the following components:   Glucose-Capillary 296 (*)    All other components within normal limits  CBC     IMAGES: CXR 11/23/22: FINDINGS: The heart size and mediastinal contours are within normal limits. Both lungs are clear. The visualized skeletal structures are unremarkable. Surgical clips are identified in the upper abdomen is stable. IMPRESSION: No active cardiopulmonary disease.   EKG: EKG 04/26/23: Normal sinus rhythm Low voltage QRS Borderline ECG When compared with ECG of 25-May-2019 10:48, PREVIOUS ECG IS PRESENT No significant change since last tracing Confirmed by York Pellant [phone]) on 04/26/2023 9:07:40 PM     CV: Echo 02/16/12 (former [facility]): Summary: Technically difficult study. There is mild concentric LVH. Left ventricular systolic function is normal.  Ejection fraction > 55%. The transmitral septal Doppler flow pattern is normal for age. The left atrial size is normal.   Right ventricular systolic pressure is normal. No valvular disease noted.   Nuclear stress test 02/16/12 (former [facility]): Impression: Small area of breast attenuation artifact.  No reversible ischemia.  Post stress EF 82%.  No significant wall motion abnormalities noted.  No Lexiscan EKG changes.  Nondiagnostic for ischemia.  This is a low risk  scan.     Past Medical History:  Diagnosis Date   Anxiety    Arthritis    lumbar stenosis   Complication of anesthesia    Depression    Diabetes mellitus    Family history of adverse reaction to anesthesia    family history of N&V   GERD (gastroesophageal reflux disease)    Hyperlipidemia    Hypertension    Irritable bowel syndrome    PONV (postoperative nausea and vomiting)     Past Surgical History:  Procedure  Laterality Date   ANTERIOR CERVICAL DECOMPRESSION/DISCECTOMY FUSION 4 LEVELS N/A 05/04/2023   Procedure: Cervical Four to Cervical Seven Anterior Cervical Discectomy and Fusion;  Surgeon: Jadene Pierini, MD;  [facility]: [facility];  Service: Neurosurgery;  Laterality: N/A;  RM 21 3C   CARPAL TUNNEL RELEASE     Patient has had three times - 2 times right hand, one time to the left   CERVICAL CONE BIOPSY  2009   CHOLECYSTECTOMY  1997   COLONOSCOPY     COLONOSCOPY WITH PROPOFOL N/A 07/16/2022   Procedure: COLONOSCOPY WITH PROPOFOL;  Surgeon: Dolores Frame, MD;  [facility];  Service: Gastroenterology;  Laterality: N/A;  1055 ASA 2   DILATION AND CURETTAGE OF UTERUS  2009   HERNIA REPAIR  2004   Incisional Hernia from Liver Resection   LIVER SURGERY  2000   Liver resection   NM MYOCAR PERF WALL MOTION  02/16/2012   low risk study.  Small area of breast attenuation   POSTERIOR CERVICAL LAMINECTOMY WITH MET- RX Right 08/16/2023   Procedure: Right Cervical seven-Thoracic one minimally invasive foraminotomy with o-arm and navigation;  Surgeon: Jadene Pierini, MD;  Location[facility];  Service: Neurosurgery;  Laterality: Right;   TONSILLECTOMY  1978   TRIGGER FINGER RELEASE Bilateral    several times   UPPER GASTROINTESTINAL ENDOSCOPY     US ECHOCARDIOGRAPHY  02/16/2012   mild concentric LVH, EF >55%    MEDICATIONS:  ACCU-CHEK AVIVA PLUS test strip   ASHWAGANDHA PO   Black Currant Seed Oil 500 MG CAPS   Cholecalciferol (VITAMIN D-3 PO)   Choline Fenofibrate (TRILIPIX) 135 MG capsule   Cinnamon 500 MG capsule   clonazePAM (KLONOPIN) 0.5 MG tablet   Collagen-Vitamin C (COLLAGEN PLUS VITAMIN C PO)   DULoxetine (CYMBALTA) 60 MG capsule   empagliflozin (JARDIANCE) 25 MG TABS tablet   gabapentin (NEURONTIN) 400 MG capsule   Ginger, Zingiber officinalis, (GINGER ROOT) 550 MG CAPS   insulin aspart (NOVOLOG FLEXPEN) 100 UNIT/ML FlexPen   Insulin Degludec (TRESIBA  FLEXTOUCH) 200 UNIT/ML SOPN   levocetirizine (XYZAL) 5 MG tablet   lisinopril-hydrochlorothiazide (PRINZIDE,ZESTORETIC) 20-12.5 MG per tablet   Magnesium Oxide -Mg Supplement 250 MG TABS   methocarbamol (ROBAXIN) 750 MG tablet   pantoprazole (PROTONIX) 40 MG tablet   Probiotic Product (PROBIOTIC DIGESTIVE SUPP PO)   rosuvastatin (CRESTOR) 40 MG tablet   sitaGLIPtin (JANUVIA) 100 MG tablet   Tart Cherry 1200 MG CAPS   Turmeric 500 MG CAPS   No current facility-administered medications for this encounter.    Shonna Chock, PA-C Surgical Short Stay/Anesthesiology [facility] Phone [phone] [facility] Phone [phone] 10/28/2023 10:31 PM

## 2023-10-28 NOTE — Progress Notes (Signed)
Patients PCR MRSA from pre-op PAT appointment on 10/27/2023 returned positive.  Dr Amanda Pea, Antionette Poles PA-C and Shonna Chock PA-C made aware.

## 2023-10-28 NOTE — Anesthesia Preprocedure Evaluation (Addendum)
Anesthesia Evaluation  Patient identified by MRN, date of birth, ID band Patient awake    Reviewed: Allergy & Precautions, NPO status , Patient's Chart, lab work & pertinent test results  History of Anesthesia Complications (+) PONV and history of anesthetic complications  Airway Mallampati: II  TM Distance: >3 FB Neck ROM: Full    Dental  (+) Dental Advisory Given, Teeth Intact   Pulmonary neg pulmonary ROS   Pulmonary exam normal breath sounds clear to auscultation       Cardiovascular hypertension, Pt. on medications Normal cardiovascular exam Rhythm:Regular Rate:Normal     Neuro/Psych  Headaches PSYCHIATRIC DISORDERS Anxiety Depression       GI/Hepatic Neg liver ROS,GERD  Controlled and Medicated,,  Endo/Other  diabetes, Well Controlled, Type 2, Insulin Dependent, Oral Hypoglycemic Agents  Class 3 obesityBMI 43  Renal/GU negative Renal ROS     Musculoskeletal  (+) Arthritis , Osteoarthritis,    Abdominal  (+) + obese  Peds  Hematology negative hematology ROS (+) Hb 13.5   Anesthesia Other Findings   Reproductive/Obstetrics negative OB ROS                             Anesthesia Physical Anesthesia Plan  ASA: 3  Anesthesia Plan: General   Post-op Pain Management: Tylenol PO (pre-op)*   Induction: Intravenous  PONV Risk Score and Plan: 4 or greater and Ondansetron, Dexamethasone, Propofol infusion, TIVA, Midazolam, Treatment may vary due to age or medical condition and Scopolamine patch - Pre-op  Airway Management Planned: Oral ETT  Additional Equipment: None  Intra-op Plan:   Post-operative Plan: Extubation in OR  Informed Consent: I have reviewed the patients History and Physical, chart, labs and discussed the procedure including the risks, benefits and alternatives for the proposed anesthesia with the patient or authorized representative who has indicated his/her  understanding and acceptance.     Dental advisory given  Plan Discussed with: CRNA  Anesthesia Plan Comments: (Last airway note: Ventilation: Mask ventilation without difficulty and Oral airway inserted - appropriate to patient size Laryngoscope Size: 4 and Glidescope Grade View: Grade I Tube type: Oral Tube size: 7.5 mm Number of attempts: 1  )       Anesthesia Quick Evaluation

## 2023-11-02 ENCOUNTER — Ambulatory Visit (HOSPITAL_COMMUNITY): Payer: BC Managed Care – PPO | Admitting: Vascular Surgery

## 2023-11-02 ENCOUNTER — Other Ambulatory Visit: Payer: Self-pay

## 2023-11-02 ENCOUNTER — Ambulatory Visit (HOSPITAL_COMMUNITY)
Admission: RE | Admit: 2023-11-02 | Discharge: 2023-11-02 | Disposition: A | Discharge status: Home / Self Care | Payer: BC Managed Care – PPO | Attending: Orthopedic Surgery | Admitting: Orthopedic Surgery

## 2023-11-02 ENCOUNTER — Ambulatory Visit (HOSPITAL_COMMUNITY): Payer: BC Managed Care – PPO | Admitting: Anesthesiology

## 2023-11-02 ENCOUNTER — Encounter (HOSPITAL_COMMUNITY): Payer: Self-pay | Admitting: Orthopedic Surgery

## 2023-11-02 ENCOUNTER — Encounter (HOSPITAL_COMMUNITY)
Admission: RE | Disposition: A | Discharge status: Home / Self Care | Payer: Self-pay | Source: Home / Self Care | Attending: Orthopedic Surgery

## 2023-11-02 DIAGNOSIS — E119 Type 2 diabetes mellitus without complications: Secondary | ICD-10-CM | POA: Insufficient documentation

## 2023-11-02 DIAGNOSIS — Z7984 Long term (current) use of oral hypoglycemic drugs: Secondary | ICD-10-CM | POA: Diagnosis not present

## 2023-11-02 DIAGNOSIS — Z794 Long term (current) use of insulin: Secondary | ICD-10-CM | POA: Insufficient documentation

## 2023-11-02 DIAGNOSIS — I10 Essential (primary) hypertension: Secondary | ICD-10-CM | POA: Diagnosis not present

## 2023-11-02 DIAGNOSIS — G5621 Lesion of ulnar nerve, right upper limb: Secondary | ICD-10-CM | POA: Insufficient documentation

## 2023-11-02 DIAGNOSIS — Z01818 Encounter for other preprocedural examination: Secondary | ICD-10-CM

## 2023-11-02 HISTORY — PX: ULNAR NERVE TRANSPOSITION: SHX2595

## 2023-11-02 LAB — GLUCOSE, CAPILLARY
Glucose-Capillary: 193 mg/dL — ABNORMAL HIGH (ref 70–99)
Glucose-Capillary: 133 mg/dL — ABNORMAL HIGH (ref 70–99)
Glucose-Capillary: 137 mg/dL — ABNORMAL HIGH (ref 70–99)

## 2023-11-02 LAB — POCT PREGNANCY, URINE: Preg Test, Ur: NEGATIVE

## 2023-11-02 SURGERY — ULNAR NERVE DECOMPRESSION/TRANSPOSITION
Anesthesia: General | Laterality: Right

## 2023-11-02 MED ORDER — SUCCINYLCHOLINE CHLORIDE 200 MG/10ML IV SOSY
PREFILLED_SYRINGE | INTRAVENOUS | Status: AC
  Filled 2023-11-02: qty 10

## 2023-11-02 MED ORDER — LACTATED RINGERS IV SOLN: INTRAVENOUS | Status: DC | PRN

## 2023-11-02 MED ORDER — KETOROLAC TROMETHAMINE 30 MG/ML IJ SOLN: 30.0000 mg | Freq: Once | INTRAMUSCULAR | Status: DC | PRN

## 2023-11-02 MED ORDER — ESMOLOL HCL 100 MG/10ML IV SOLN
INTRAVENOUS | Status: AC
  Filled 2023-11-02: qty 10

## 2023-11-02 MED ORDER — PROPOFOL 10 MG/ML IV BOLUS
INTRAVENOUS | Status: AC
  Filled 2023-11-02: qty 20

## 2023-11-02 MED ORDER — ONDANSETRON HCL 4 MG/2ML IJ SOLN
INTRAMUSCULAR | Status: DC | PRN
  Administered 2023-11-02: 4 mg via INTRAVENOUS

## 2023-11-02 MED ORDER — AMISULPRIDE (ANTIEMETIC) 5 MG/2ML IV SOLN
10.0000 mg | Freq: Once | INTRAVENOUS | Status: AC | PRN
  Administered 2023-11-02: 10 mg via INTRAVENOUS

## 2023-11-02 MED ORDER — GLYCOPYRROLATE PF 0.2 MG/ML IJ SOSY
PREFILLED_SYRINGE | INTRAMUSCULAR | Status: AC
  Filled 2023-11-02: qty 1

## 2023-11-02 MED ORDER — FENTANYL CITRATE (PF) 250 MCG/5ML IJ SOLN
INTRAMUSCULAR | Status: AC
  Filled 2023-11-02: qty 5

## 2023-11-02 MED ORDER — BUPIVACAINE HCL (PF) 0.25 % IJ SOLN
INTRAMUSCULAR | Status: DC | PRN
  Administered 2023-11-02: 20 mL

## 2023-11-02 MED ORDER — ONDANSETRON HCL 4 MG/2ML IJ SOLN
INTRAMUSCULAR | Status: AC
  Filled 2023-11-02: qty 2

## 2023-11-02 MED ORDER — AMISULPRIDE (ANTIEMETIC) 5 MG/2ML IV SOLN
INTRAVENOUS | Status: AC
  Filled 2023-11-02: qty 2

## 2023-11-02 MED ORDER — CHLORHEXIDINE GLUCONATE 0.12 % MT SOLN
OROMUCOSAL | Status: AC
  Administered 2023-11-02: 15 mL via OROMUCOSAL
  Filled 2023-11-02: qty 15

## 2023-11-02 MED ORDER — NEOSTIGMINE METHYLSULFATE 3 MG/3ML IV SOSY
PREFILLED_SYRINGE | INTRAVENOUS | Status: AC
  Filled 2023-11-02: qty 3

## 2023-11-02 MED ORDER — VANCOMYCIN HCL 1500 MG/300ML IV SOLN
1500.0000 mg | INTRAVENOUS | Status: AC
  Administered 2023-11-02: 1500 mg via INTRAVENOUS
  Filled 2023-11-02: qty 300

## 2023-11-02 MED ORDER — CEFAZOLIN SODIUM-DEXTROSE 2-4 GM/100ML-% IV SOLN: 2.0000 g | INTRAVENOUS | Status: DC

## 2023-11-02 MED ORDER — CHLORHEXIDINE GLUCONATE 0.12 % MT SOLN: 15.0000 mL | Freq: Once | OROMUCOSAL | Status: AC

## 2023-11-02 MED ORDER — EPHEDRINE 5 MG/ML INJ
INTRAVENOUS | Status: AC
  Filled 2023-11-02: qty 5

## 2023-11-02 MED ORDER — ACETAMINOPHEN 500 MG PO TABS
1000.0000 mg | ORAL_TABLET | Freq: Once | ORAL | Status: AC
  Administered 2023-11-02: 1000 mg via ORAL
  Filled 2023-11-02: qty 2

## 2023-11-02 MED ORDER — BUPIVACAINE HCL (PF) 0.25 % IJ SOLN
INTRAMUSCULAR | Status: AC
  Filled 2023-11-02: qty 20

## 2023-11-02 MED ORDER — PHENYLEPHRINE 80 MCG/ML (10ML) SYRINGE FOR IV PUSH (FOR BLOOD PRESSURE SUPPORT)
PREFILLED_SYRINGE | INTRAVENOUS | Status: AC
  Filled 2023-11-02: qty 10

## 2023-11-02 MED ORDER — METOCLOPRAMIDE HCL 5 MG/ML IJ SOLN
INTRAMUSCULAR | Status: AC
  Filled 2023-11-02: qty 2

## 2023-11-02 MED ORDER — SCOPOLAMINE 1 MG/3DAYS TD PT72
MEDICATED_PATCH | TRANSDERMAL | Status: AC
  Filled 2023-11-02: qty 1

## 2023-11-02 MED ORDER — SCOPOLAMINE 1 MG/3DAYS TD PT72
1.0000 | MEDICATED_PATCH | TRANSDERMAL | Status: DC
  Administered 2023-11-02: 1.5 mg via TRANSDERMAL

## 2023-11-02 MED ORDER — ROCURONIUM BROMIDE 10 MG/ML (PF) SYRINGE
PREFILLED_SYRINGE | INTRAVENOUS | Status: DC | PRN
  Administered 2023-11-02: 100 mg via INTRAVENOUS

## 2023-11-02 MED ORDER — VANCOMYCIN HCL IN DEXTROSE 1-5 GM/200ML-% IV SOLN
INTRAVENOUS | Status: AC
  Filled 2023-11-02: qty 200

## 2023-11-02 MED ORDER — PROPOFOL 10 MG/ML IV BOLUS
INTRAVENOUS | Status: DC | PRN
  Administered 2023-11-02: 150 mg via INTRAVENOUS

## 2023-11-02 MED ORDER — ROCURONIUM BROMIDE 10 MG/ML (PF) SYRINGE
PREFILLED_SYRINGE | INTRAVENOUS | Status: AC
  Filled 2023-11-02: qty 10

## 2023-11-02 MED ORDER — INSULIN ASPART 100 UNIT/ML IJ SOLN
0.0000 [IU] | INTRAMUSCULAR | Status: DC | PRN
  Administered 2023-11-02: 4 [IU] via SUBCUTANEOUS

## 2023-11-02 MED ORDER — HEPARIN SODIUM (PORCINE) 1000 UNIT/ML IJ SOLN
INTRAMUSCULAR | Status: AC
  Filled 2023-11-02: qty 10

## 2023-11-02 MED ORDER — LIDOCAINE 2% (20 MG/ML) 5 ML SYRINGE
INTRAMUSCULAR | Status: AC
  Filled 2023-11-02: qty 5

## 2023-11-02 MED ORDER — ARTIFICIAL TEARS OPHTHALMIC OINT
TOPICAL_OINTMENT | OPHTHALMIC | Status: AC
  Filled 2023-11-02: qty 3.5

## 2023-11-02 MED ORDER — ORAL CARE MOUTH RINSE: 15.0000 mL | Freq: Once | OROMUCOSAL | Status: AC

## 2023-11-02 MED ORDER — PROTAMINE SULFATE 10 MG/ML IV SOLN
INTRAVENOUS | Status: AC
  Filled 2023-11-02: qty 5

## 2023-11-02 MED ORDER — SUGAMMADEX SODIUM 200 MG/2ML IV SOLN
INTRAVENOUS | Status: DC | PRN
  Administered 2023-11-02: 400 mg via INTRAVENOUS

## 2023-11-02 MED ORDER — INSULIN ASPART 100 UNIT/ML IJ SOLN
INTRAMUSCULAR | Status: AC
  Filled 2023-11-02: qty 1

## 2023-11-02 MED ORDER — ACETAMINOPHEN 10 MG/ML IV SOLN
INTRAVENOUS | Status: AC
  Filled 2023-11-02: qty 100

## 2023-11-02 MED ORDER — PROPOFOL 1000 MG/100ML IV EMUL
INTRAVENOUS | Status: AC
  Filled 2023-11-02: qty 100

## 2023-11-02 MED ORDER — CEFAZOLIN SODIUM-DEXTROSE 2-4 GM/100ML-% IV SOLN
INTRAVENOUS | Status: AC
  Filled 2023-11-02: qty 100

## 2023-11-02 MED ORDER — DEXAMETHASONE SODIUM PHOSPHATE 10 MG/ML IJ SOLN
INTRAMUSCULAR | Status: DC | PRN
  Administered 2023-11-02: 10 mg via INTRAVENOUS

## 2023-11-02 MED ORDER — MIDAZOLAM HCL 2 MG/2ML IJ SOLN
INTRAMUSCULAR | Status: AC
Start: 2023-11-02 — End: ?
  Filled 2023-11-02: qty 2

## 2023-11-02 MED ORDER — DEXAMETHASONE SODIUM PHOSPHATE 10 MG/ML IJ SOLN
INTRAMUSCULAR | Status: AC
  Filled 2023-11-02: qty 1

## 2023-11-02 MED ORDER — PROPOFOL 500 MG/50ML IV EMUL
INTRAVENOUS | Status: DC | PRN
  Administered 2023-11-02: 25 ug/kg/min via INTRAVENOUS

## 2023-11-02 MED ORDER — LIDOCAINE 2% (20 MG/ML) 5 ML SYRINGE
INTRAMUSCULAR | Status: DC | PRN
  Administered 2023-11-02: 100 mg via INTRAVENOUS

## 2023-11-02 MED ORDER — 0.9 % SODIUM CHLORIDE (POUR BTL) OPTIME
TOPICAL | Status: DC | PRN
  Administered 2023-11-02: 1000 mL

## 2023-11-02 MED ORDER — HYDROMORPHONE HCL 1 MG/ML IJ SOLN: 0.2500 mg | INTRAMUSCULAR | Status: DC | PRN

## 2023-11-02 MED ORDER — FENTANYL CITRATE (PF) 250 MCG/5ML IJ SOLN
INTRAMUSCULAR | Status: DC | PRN
  Administered 2023-11-02: 100 ug via INTRAVENOUS

## 2023-11-02 MED ORDER — HYDROMORPHONE HCL 1 MG/ML IJ SOLN
INTRAMUSCULAR | Status: AC
  Administered 2023-11-02: 0.25 mg via INTRAVENOUS
  Filled 2023-11-02: qty 1

## 2023-11-02 MED ORDER — METOCLOPRAMIDE HCL 5 MG/ML IJ SOLN
INTRAMUSCULAR | Status: DC | PRN
  Administered 2023-11-02: 10 mg via INTRAVENOUS

## 2023-11-02 MED ORDER — MIDAZOLAM HCL 2 MG/2ML IJ SOLN
INTRAMUSCULAR | Status: DC | PRN
  Administered 2023-11-02: 2 mg via INTRAVENOUS

## 2023-11-02 MED ORDER — HYDROCODONE-ACETAMINOPHEN 7.5-325 MG PO TABS: 1.0000 | ORAL_TABLET | Freq: Once | ORAL | Status: DC | PRN

## 2023-11-02 MED ORDER — ACETAMINOPHEN 10 MG/ML IV SOLN
1000.0000 mg | Freq: Once | INTRAVENOUS | Status: AC
  Administered 2023-11-02: 1000 mg via INTRAVENOUS

## 2023-11-02 MED ORDER — ONDANSETRON HCL 4 MG/2ML IJ SOLN
4.0000 mg | Freq: Once | INTRAMUSCULAR | Status: AC | PRN
  Administered 2023-11-02: 4 mg via INTRAVENOUS

## 2023-11-02 SURGICAL SUPPLY — 31 items
BAG COUNTER SPONGE SURGICOUNT (BAG) ×1 IMPLANT
BNDG ELASTIC 6X10 VLCR STRL LF (GAUZE/BANDAGES/DRESSINGS) IMPLANT
BNDG GAUZE DERMACEA FLUFF 4 (GAUZE/BANDAGES/DRESSINGS) ×2 IMPLANT
BNDG STRETCH GAUZE 3IN X12FT (GAUZE/BANDAGES/DRESSINGS) IMPLANT
CORD BIPOLAR FORCEPS 12FT (ELECTRODE) ×1 IMPLANT
COVER SURGICAL LIGHT HANDLE (MISCELLANEOUS) ×1 IMPLANT
DRAPE SURG 17X23 STRL (DRAPES) ×1 IMPLANT
DRSG ADAPTIC 3X8 NADH LF (GAUZE/BANDAGES/DRESSINGS) IMPLANT
GAUZE SPONGE 4X4 12PLY STRL LF (GAUZE/BANDAGES/DRESSINGS) IMPLANT
GAUZE XEROFORM 5X9 LF (GAUZE/BANDAGES/DRESSINGS) IMPLANT
GLOVE BIOGEL M 8.0 STRL (GLOVE) ×1 IMPLANT
GLOVE SS BIOGEL STRL SZ 8 (GLOVE) ×1 IMPLANT
GOWN STRL REUS W/ TWL LRG LVL3 (GOWN DISPOSABLE) ×2 IMPLANT
GOWN STRL REUS W/ TWL XL LVL3 (GOWN DISPOSABLE) ×3 IMPLANT
KIT BASIN OR (CUSTOM PROCEDURE TRAY) ×1 IMPLANT
KIT TURNOVER KIT B (KITS) ×1 IMPLANT
LOOP VESSEL MAXI BLUE 1X16 (MISCELLANEOUS) IMPLANT
MANIFOLD NEPTUNE II (INSTRUMENTS) ×1 IMPLANT
NS IRRIG 1000ML POUR BTL (IV SOLUTION) ×1 IMPLANT
PACK ORTHO EXTREMITY (CUSTOM PROCEDURE TRAY) ×1 IMPLANT
PAD ARMBOARD 7.5X6 YLW CONV (MISCELLANEOUS) ×2 IMPLANT
PADDING CAST ABS COTTON 4X4 ST (CAST SUPPLIES) IMPLANT
SLING ARM IMMOBILIZER LRG (SOFTGOODS) IMPLANT
SOL PREP POV-IOD 4OZ 10% (MISCELLANEOUS) ×3 IMPLANT
SPIKE FLUID TRANSFER (MISCELLANEOUS) ×1 IMPLANT
SPLINT FIBERGLASS 3X35 (CAST SUPPLIES) IMPLANT
SUT PROLENE 4 0 PS 2 18 (SUTURE) IMPLANT
SUT VIC AB 3-0 SH 27X BRD (SUTURE) IMPLANT
TOWEL GREEN STERILE (TOWEL DISPOSABLE) ×1 IMPLANT
TOWEL GREEN STERILE FF (TOWEL DISPOSABLE) ×1 IMPLANT
UNDERPAD 30X36 HEAVY ABSORB (UNDERPADS AND DIAPERS) ×1 IMPLANT

## 2023-11-02 NOTE — Progress Notes (Signed)
Patient received pre-op only Vancomycin 1.5 mg, per Dr. Amanda Pea verbal order.

## 2023-11-02 NOTE — Anesthesia Procedure Notes (Signed)
Procedure Name: Intubation Date/Time: 11/02/2023 6:53 PM  Performed by: Camillia Herter, CRNAPre-anesthesia Checklist: Patient identified, Emergency Drugs available, Suction available, Patient being monitored and Timeout performed Patient Re-evaluated:Patient Re-evaluated prior to induction Oxygen Delivery Method: Circle system utilized Preoxygenation: Pre-oxygenation with 100% oxygen Induction Type: IV induction and Rapid sequence Ventilation: Mask ventilation without difficulty and Oral airway inserted - appropriate to patient size Laryngoscope Size: Glidescope and 4 Grade View: Grade I Tube type: Subglottic suction tube Tube size: 7.0 mm Number of attempts: 1 Airway Equipment and Method: Stylet and Video-laryngoscopy Placement Confirmation: ETT inserted through vocal cords under direct vision, breath sounds checked- equal and bilateral and CO2 detector Secured at: 21 cm Tube secured with: Tape Dental Injury: Teeth and Oropharynx as per pre-operative assessment

## 2023-11-02 NOTE — Op Note (Signed)
11/02/2023  Operative note 11/02/2023.  Date of dictation and operation 11/02/2003.  Tamara Severin, MD.  Preoperative diagnosis: Right ulnar nerve compression at the elbow with noted positive EMG/NCV studies  Postop diagnosis: The same.  Procedure #1 ulnar nerve decompression anterior transposition right elbow #2 flexor pronator lengthening right elbow at the flexor pronator mass  Surgeon Tamara Solomon.  Anesthesia General.  Tourniquet time less than an hour.  Drains none.  Description of procedure: Patient was seen by myself and anesthesia.  She was extensively counseled.  She was taken to the operative theater and underwent a general anesthetic without difficulty.  Following this the patient was prepped and draped with 2 Hibiclens scrubs followed by Betadine scrub.  A tourniquet was then insufflated after timeout.  A curvilinear posterior medial incision was made.  Dissection was carried down to the subcu and then I used tenotomy scissors to develop planes.  The plane between the subtendinous tissue and the fascia was noted.  The medial antebrachial cutaneous nerve branch was identified and protected at all times.  I identified the ulnar nerve and at this time performed a careful and cautious decompression.  The arcade of Struthers, medial intermuscular septum, 2 heads of the FCU, Osborne's ligament and cubital tunnel were all released under direct 4.5 loupe magnification.  The medial intermuscular septum was excised.  I mobilized the nerve which was very thickened and adherent soft tissue.  This was done with vessel loop very carefully.  I used a facial nerve dissector for meticulous dissection.  Once the nerve was mobilized we then performed a flexor pronator fascial release.  I performed a fascia release distally and took a strip of fascia from the most anterior portion.  This was used for an Scientist, physiological.  I then made sure there were no sites of compression about the nerve.  I made  sure there were no constricting or tight areas about the flexor pronator region which I performed the flexor pronator release on.  The nerve was then mobilized and tourniquet was deflated.  Hemostasis was secured with bipolar cautery and following this I sutured the fascial sling to the subcu and then additionally the subcu to the medial epicondyle.  I checked the anterior route of the nerve and there was no subluxation tendencies or problems.  The nerve had ample room anteriorly and had a nice area for its path.  Following this we then performed irrigation followed by closure of the skin edge with Prolene.  I did suture the cubital tunnel with Vicryl.  Once again this was an anterior ulnar nerve transposition with flexor pronator lengthening and fascial release.  There were no complications.  The nerve sat tension-free anteriorly.  She was stable through range of motion.  Discharge medicines have been sent to her pharmacy.  She has my cell phone for any problems.  I discussed all issues with her husband.

## 2023-11-02 NOTE — Transfer of Care (Signed)
Immediate Anesthesia Transfer of Care Note  Patient: Tamara Solomon  Procedure(s) Performed: Right elbow ulnar nerve decompression and anterior transposition with flexor pronator lengthening and medial epicondyle debridement and repair as necessary (Right)  Patient Location: PACU  Anesthesia Type:General  Level of Consciousness: drowsy  Airway & Oxygen Therapy: Patient Spontanous Breathing and Patient connected to face mask oxygen  Post-op Assessment: Report given to RN and Post -op Vital signs reviewed and stable  Post vital signs: Reviewed and stable  Last Vitals:  Vitals Value Taken Time  BP 125/53 11/02/23 2030  Temp 36.3 C 11/02/23 2025  Pulse 81 11/02/23 2037  Resp 17 11/02/23 2037  SpO2 97 % 11/02/23 2037  Vitals shown include unfiled device data.  Last Pain:  Vitals:   11/02/23 2025  TempSrc:   PainSc: Asleep         Complications: No notable events documented.

## 2023-11-02 NOTE — H&P (Signed)
Tamara Solomon is an 52 y.o. female.   Chief Complaint: The patient is here for right ulnar nerve decompression and anterior transposition with flexor pronator lengthening as necessary HPI: Patient presents for evaluation and treatment of the of their upper extremity predicament. The patient denies neck, back, chest or  abdominal pain. The patient notes that they have no lower extremity problems. The patients primary complaint is noted. We are planning surgical care pathway for the upper extremity.  I discussed with the patient all issues plans and concerns.  The patient does have significant neck issues which have been addressed by neurosurgery with 2 surgeries.  I have reviewed the findings -we will proceed accordingly with the above-mentioned ulnar nerve decompression and anterior transposition.  Past Medical History:  Diagnosis Date  . Anxiety   . Arthritis    lumbar stenosis  . Complication of anesthesia   . Depression   . Diabetes mellitus   . Family history of adverse reaction to anesthesia    family history of N&V  . GERD (gastroesophageal reflux disease)   . Hyperlipidemia   . Hypertension   . Irritable bowel syndrome   . PONV (postoperative nausea and vomiting)     Past Surgical History:  Procedure Laterality Date  . ANTERIOR CERVICAL DECOMPRESSION/DISCECTOMY FUSION 4 LEVELS N/A 05/04/2023   Procedure: Cervical Four to Cervical Seven Anterior Cervical Discectomy and Fusion;  Surgeon: Jadene Pierini, MD;  Location: [facility] OR;  Service: Neurosurgery;  Laterality: N/A;  RM 21 3C  . CARPAL TUNNEL RELEASE     Patient has had three times - 2 times right hand, one time to the left  . CERVICAL CONE BIOPSY  2009  . CHOLECYSTECTOMY  1997  . COLONOSCOPY    . COLONOSCOPY WITH PROPOFOL N/A 07/16/2022   Procedure: COLONOSCOPY WITH PROPOFOL;  Surgeon: Dolores Frame, MD;  [facility];  Service: Gastroenterology;  Laterality: N/A;  1055 ASA 2  . DILATION AND  CURETTAGE OF UTERUS  2009  . HERNIA REPAIR  2004   Incisional Hernia from Liver Resection  . LIVER SURGERY  2000   Liver resection  . NM MYOCAR PERF WALL MOTION  02/16/2012   low risk study.  Small area of breast attenuation  . POSTERIOR CERVICAL LAMINECTOMY WITH MET- RX Right 08/16/2023   Procedure: Right Cervical seven-Thoracic one minimally invasive foraminotomy with o-arm and navigation;  Surgeon: Jadene Pierini, MD;  Location: [facility] OR;  Service: Neurosurgery;  Laterality: Right;  . TONSILLECTOMY  1978  . TRIGGER FINGER RELEASE Bilateral    several times  . UPPER GASTROINTESTINAL ENDOSCOPY    . US ECHOCARDIOGRAPHY  02/16/2012   mild concentric LVH, EF >55%    Family History  Problem Relation Age of Onset  . Healthy Mother   . Liver cancer Father   . Healthy Son   . Diabetes Brother    Social History:  reports that she has never smoked. She has never used smokeless tobacco. She reports that she does not drink alcohol and does not use drugs.  Allergies:  Allergies  Allergen Reactions  . Topamax [Topiramate] Swelling    Angioedema  . Codeine Nausea And Vomiting and Rash  . Metformin Nausea And Vomiting  . Percocet [Oxycodone-Acetaminophen] Nausea And Vomiting  . Tramadol Nausea And Vomiting    Medications Prior to Admission  Medication Sig Dispense Refill  . ASHWAGANDHA PO Take 3,000 mg by mouth in the morning.    . Black Currant Seed Oil  500 MG CAPS Take 500 mg by mouth daily.    . Cholecalciferol (VITAMIN D-3 PO) Take 10,000 Units by mouth in the morning.    . Choline Fenofibrate (TRILIPIX) 135 MG capsule Take 135 mg by mouth in the morning.    . Cinnamon 500 MG capsule Take 2 capsules (1,000 mg total) by mouth in the morning.    . clonazePAM (KLONOPIN) 0.5 MG tablet Take 0.5 mg by mouth 2 (two) times daily.    . Collagen-Vitamin C (COLLAGEN PLUS VITAMIN C PO) Take 1 capsule by mouth daily.    . DULoxetine (CYMBALTA) 60 MG capsule Take 60 mg by mouth in the morning.     . empagliflozin (JARDIANCE) 25 MG TABS tablet Take 25 mg by mouth in the morning.    . gabapentin (NEURONTIN) 400 MG capsule Take 400 mg by mouth 3 (three) times daily.    . Ginger, Zingiber officinalis, (GINGER ROOT) 550 MG CAPS Take 550 mg by mouth in the morning.    . insulin aspart (NOVOLOG FLEXPEN) 100 UNIT/ML FlexPen Inject 30 Units into the skin 2 (two) times daily with breakfast and lunch.    . Insulin Degludec (TRESIBA FLEXTOUCH) 200 UNIT/ML SOPN Inject 160 Units into the skin in the morning.    Marland Kitchen levocetirizine (XYZAL) 5 MG tablet Take 5 mg by mouth every evening.    Marland Kitchen lisinopril-hydrochlorothiazide (PRINZIDE,ZESTORETIC) 20-12.5 MG per tablet Take 1 tablet by mouth in the morning.    . Magnesium Oxide -Mg Supplement 250 MG TABS Take 250 mg by mouth in the morning.    . methocarbamol (ROBAXIN) 750 MG tablet Take 750 mg by mouth in the morning and at bedtime.    . pantoprazole (PROTONIX) 40 MG tablet Take 40 mg by mouth 2 (two) times daily.    . Probiotic Product (PROBIOTIC DIGESTIVE SUPP PO) Take 1 capsule by mouth in the morning.    . rosuvastatin (CRESTOR) 40 MG tablet Take 40 mg by mouth in the morning.    . sitaGLIPtin (JANUVIA) 100 MG tablet Take 100 mg by mouth daily.    . Tart Cherry 1200 MG CAPS Take 1,200 mg by mouth in the morning.    . Turmeric 500 MG CAPS Take 500 mg by mouth in the morning.    Marland Kitchen ACCU-CHEK AVIVA PLUS test strip       Results for orders placed or performed during the hospital encounter of 11/02/23 (from the past 48 hour(s))  Glucose, capillary     Status: Abnormal   Collection Time: 11/02/23  3:11 PM  Result Value Ref Range   Glucose-Capillary 193 (H) 70 - 99 mg/dL    Comment: Glucose reference range applies only to samples taken after fasting for at least 8 hours.  Pregnancy, urine POC     Status: None   Collection Time: 11/02/23  3:41 PM  Result Value Ref Range   Preg Test, Ur NEGATIVE NEGATIVE    Comment:        THE SENSITIVITY OF  THIS METHODOLOGY IS >24 mIU/mL   Glucose, capillary     Status: Abnormal   Collection Time: 11/02/23  5:27 PM  Result Value Ref Range   Glucose-Capillary 133 (H) 70 - 99 mg/dL    Comment: Glucose reference range applies only to samples taken after fasting for at least 8 hours.   No results found.  Review of Systems  Respiratory: Negative.    Cardiovascular: Negative.    Blood pressure 126/74, pulse 76, temperature 98.4  F (36.9 C), temperature source Oral, resp. rate 18, height 5\' 3"  (1.6 m), weight 107.5 kg, last menstrual period 08/18/2023, SpO2 97%, unknown if currently breastfeeding. Physical Exam  Right ulnar nerve abnormality in terms of irritability with compression and Tinel's testing.  Positive electrodiagnostics.  History of neck surgery x 2 with incomplete relief of symptoms.  I discussed with the patient all issues plans and concerns as it relates to the upper extremity predicament.  No intrinsic atrophy at present time.  She has a history of advanced changes about the median nerve due to multiple surgical releases with hypothenar fat pad flap.  The patient is alert and oriented in no acute distress. The patient complains of pain in the affected upper extremity.  The patient is noted to have a normal HEENT exam. Lung fields show equal chest expansion and no shortness of breath. Abdomen exam is nontender without distention. Lower extremity examination does not show any fracture dislocation or blood clot symptoms. Pelvis is stable and the neck and back are stable and nontender.  Assessment/Plan We will proceed with ulnar nerve decompression anterior transposition with flexor pronator lengthening and repair as necessary  We are planning surgery for your upper extremity. The risk and benefits of surgery to include risk of bleeding, infection, anesthesia,  damage to normal structures and failure of the surgery to accomplish its intended goals of relieving symptoms and  restoring function have been discussed in detail. With this in mind we plan to proceed. I have specifically discussed with the patient the pre-and postoperative regime and the dos and don'ts and risk and benefits in great detail. Risk and benefits of surgery also include risk of dystrophy(CRPS), chronic nerve pain, failure of the healing process to go onto completion and other inherent risks of surgery The relavent the pathophysiology of the disease/injury process, as well as the alternatives for treatment and postoperative course of action has been discussed in great detail with the patient who desires to proceed.  We will do everything in our power to help you (the patient) restore function to the upper extremity. It is a pleasure to see this patient today.   Oletta Cohn III, MD 11/02/2023, 6:27 PM

## 2023-11-02 NOTE — Discharge Instructions (Signed)
You did great.  Please remember to move and massage her fingers.  If you have any concerns or emergencies please call Dr. Amanda Pea at his cell phone [phone]  We recommend that you to take vitamin C 1000 mg a day to promote healing. We also recommend that if you require  pain medicine that you take a stool softener to prevent constipation as most pain medicines will have constipation side effects. We recommend either Peri-Colace or Senokot and recommend that you also consider adding MiraLAX as well to prevent the constipation affects from pain medicine if you are required to use them. These medicines are over the counter and may be purchased at a local pharmacy. A cup of yogurt and a probiotic can also be helpful during the recovery process as the medicines can disrupt your intestinal environment. Keep bandage clean and dry.  Call for any problems.  No smoking.  Criteria for driving a car: you should be off your pain medicine for 7-8 hours, able to drive one handed(confident), thinking clearly and feeling able in your judgement to drive. Continue elevation as it will decrease swelling.  If instructed by MD move your fingers within the confines of the bandage/splint.  Use ice if instructed by your MD. Call immediately for any sudden loss of feeling in your hand/arm or change in functional abilities of the extremity.

## 2023-11-03 ENCOUNTER — Encounter (HOSPITAL_COMMUNITY): Payer: Self-pay | Admitting: Orthopedic Surgery

## 2023-11-03 MED ORDER — HEPARIN SODIUM (PORCINE) 1000 UNIT/ML IJ SOLN
INTRAMUSCULAR | Status: AC
  Filled 2023-11-03: qty 10

## 2023-11-03 MED ORDER — FENTANYL CITRATE (PF) 250 MCG/5ML IJ SOLN
INTRAMUSCULAR | Status: AC
  Filled 2023-11-03: qty 5

## 2023-11-05 NOTE — Anesthesia Postprocedure Evaluation (Signed)
Anesthesia Post Note  Patient: YING GROEBER  Procedure(s) Performed: Right elbow ulnar nerve decompression and anterior transposition with flexor pronator lengthening and medial epicondyle debridement and repair as necessary (Right)     Patient location during evaluation: PACU Anesthesia Type: General Level of consciousness: awake and alert Pain management: pain level controlled Vital Signs Assessment: post-procedure vital signs reviewed and stable Respiratory status: spontaneous breathing, nonlabored ventilation, respiratory function stable and patient connected to nasal cannula oxygen Cardiovascular status: blood pressure returned to baseline and stable Postop Assessment: no apparent nausea or vomiting Anesthetic complications: no   No notable events documented.  Last Vitals:  Vitals:   11/02/23 2130 11/02/23 2145  BP: 129/66 137/72  Pulse: 79 79  Resp: 13 15  Temp:  37.1 C  SpO2: 93% 97%    Last Pain:  Vitals:   11/02/23 2025  TempSrc:   PainSc: Asleep                 Mariann Barter

## 2023-12-01 ENCOUNTER — Emergency Department (HOSPITAL_COMMUNITY)
Admission: EM | Admit: 2023-12-01 | Discharge: 2023-12-01 | Disposition: A | Discharge status: Home / Self Care | Payer: BC Managed Care – PPO | Attending: Emergency Medicine | Admitting: Emergency Medicine

## 2023-12-01 ENCOUNTER — Emergency Department (HOSPITAL_COMMUNITY): Payer: BC Managed Care – PPO

## 2023-12-01 ENCOUNTER — Encounter (HOSPITAL_COMMUNITY): Payer: Self-pay

## 2023-12-01 DIAGNOSIS — Z794 Long term (current) use of insulin: Secondary | ICD-10-CM | POA: Insufficient documentation

## 2023-12-01 DIAGNOSIS — E119 Type 2 diabetes mellitus without complications: Secondary | ICD-10-CM | POA: Insufficient documentation

## 2023-12-01 DIAGNOSIS — R109 Unspecified abdominal pain: Secondary | ICD-10-CM | POA: Diagnosis present

## 2023-12-01 DIAGNOSIS — Z7984 Long term (current) use of oral hypoglycemic drugs: Secondary | ICD-10-CM | POA: Insufficient documentation

## 2023-12-01 DIAGNOSIS — K529 Noninfective gastroenteritis and colitis, unspecified: Secondary | ICD-10-CM | POA: Diagnosis not present

## 2023-12-01 LAB — COMPREHENSIVE METABOLIC PANEL
ALT: 31 U/L (ref 0–44)
AST: 37 U/L (ref 15–41)
Albumin: 3.7 g/dL (ref 3.5–5.0)
Alkaline Phosphatase: 56 U/L (ref 38–126)
Anion gap: 9 (ref 5–15)
BUN: 11 mg/dL (ref 6–20)
CO2: 23 mmol/L (ref 22–32)
Calcium: 9 mg/dL (ref 8.9–10.3)
Chloride: 102 mmol/L (ref 98–111)
Creatinine, Ser: 0.81 mg/dL (ref 0.44–1.00)
GFR, Estimated: >60 mL/min (ref >60)
Glucose, Bld: 115 mg/dL — ABNORMAL HIGH (ref 70–99)
Potassium: 3.7 mmol/L (ref 3.5–5.1)
Sodium: 134 mmol/L — ABNORMAL LOW (ref 135–145)
Total Bilirubin: 0.5 mg/dL (ref <1.2)
Total Protein: 7.1 g/dL (ref 6.5–8.1)

## 2023-12-01 LAB — CBC WITH DIFFERENTIAL/PLATELET
Abs Immature Granulocytes: 0.02 10*3/uL (ref 0.00–0.07)
Basophils Absolute: 0 10*3/uL (ref 0.0–0.1)
Basophils Relative: 0 %
Eosinophils Absolute: 0.1 10*3/uL (ref 0.0–0.5)
Eosinophils Relative: 3 %
HCT: 43.5 % (ref 36.0–46.0)
Hemoglobin: 14.4 g/dL (ref 12.0–15.0)
Immature Granulocytes: 0 %
Lymphocytes Relative: 26 %
Lymphs Abs: 1.3 10*3/uL (ref 0.7–4.0)
MCH: 29.2 pg (ref 26.0–34.0)
MCHC: 33.1 g/dL (ref 30.0–36.0)
MCV: 88.2 fL (ref 80.0–100.0)
Monocytes Absolute: 0.5 10*3/uL (ref 0.1–1.0)
Monocytes Relative: 10 %
Neutro Abs: 2.9 10*3/uL (ref 1.7–7.7)
Neutrophils Relative %: 61 %
Platelets: 231 10*3/uL (ref 150–400)
RBC: 4.93 MIL/uL (ref 3.87–5.11)
RDW: 13.3 % (ref 11.5–15.5)
WBC: 4.9 10*3/uL (ref 4.0–10.5)
nRBC: 0 % (ref 0.0–0.2)

## 2023-12-01 LAB — C DIFFICILE QUICK SCREEN W PCR REFLEX
C Diff antigen: NEGATIVE
C Diff interpretation: NOT DETECTED
C Diff toxin: NEGATIVE

## 2023-12-01 MED ORDER — DICYCLOMINE HCL 20 MG PO TABS: ORAL_TABLET | ORAL | 0 refills | Status: AC

## 2023-12-01 MED ORDER — SODIUM CHLORIDE 0.9 % IV BOLUS
2000.0000 mL | Freq: Once | INTRAVENOUS | Status: AC
  Administered 2023-12-01: 2000 mL via INTRAVENOUS

## 2023-12-01 MED ORDER — ONDANSETRON 4 MG PO TBDP: ORAL_TABLET | ORAL | 0 refills | Status: AC

## 2023-12-01 MED ORDER — KETOROLAC TROMETHAMINE 30 MG/ML IJ SOLN
30.0000 mg | Freq: Once | INTRAMUSCULAR | Status: AC
  Administered 2023-12-01: 30 mg via INTRAVENOUS
  Filled 2023-12-01: qty 1

## 2023-12-01 NOTE — ED Provider Notes (Signed)
[facility] EMERGENCY DEPARTMENT AT [facility] Provider Note   CSN: [id number] Arrival date & time: 12/01/23  4098     History  Chief Complaint  Patient presents with   Abdominal Pain   Emesis   Diarrhea    Tamara Solomon is a 52 y.o. female.  pt   Abdominal Pain Associated symptoms: diarrhea and vomiting   Emesis Associated symptoms: abdominal pain and diarrhea   Diarrhea Associated symptoms: abdominal pain and vomiting        Home Medications Prior to Admission medications   Medication Sig Start Date End Date Taking? Authorizing Provider  ACCU-CHEK AVIVA PLUS test strip  08/26/12   [provider]  ASHWAGANDHA PO Take 3,000 mg by mouth in the morning.    [provider]  Black Currant Seed Oil 500 MG CAPS Take 500 mg by mouth daily. 05/12/23   Iran Sizer, PA-C  Cholecalciferol (VITAMIN D-3 PO) Take 10,000 Units by mouth in the morning.    [provider]  Choline Fenofibrate (TRILIPIX) 135 MG capsule Take 135 mg by mouth in the morning.    [provider]  Cinnamon 500 MG capsule Take 2 capsules (1,000 mg total) by mouth in the morning. 05/12/23   Cosentino, Talmadge Chad, PA-C  clonazePAM (KLONOPIN) 0.5 MG tablet Take 0.5 mg by mouth 2 (two) times daily.    [provider]  Collagen-Vitamin C (COLLAGEN PLUS VITAMIN C PO) Take 1 capsule by mouth daily.    [provider]  DULoxetine (CYMBALTA) 60 MG capsule Take 60 mg by mouth in the morning.    [provider]  empagliflozin (JARDIANCE) 25 MG TABS tablet Take 25 mg by mouth in the morning.    [provider]  gabapentin (NEURONTIN) 400 MG capsule Take 400 mg by mouth 3 (three) times daily.    [provider]  Ginger, Zingiber officinalis, (GINGER ROOT) 550 MG CAPS Take 550 mg by mouth in the morning.    [provider]  insulin aspart (NOVOLOG FLEXPEN) 100 UNIT/ML FlexPen Inject 30 Units into the skin 2 (two)  times daily with breakfast and lunch.    [provider]  Insulin Degludec (TRESIBA FLEXTOUCH) 200 UNIT/ML SOPN Inject 160 Units into the skin in the morning.    [provider]  levocetirizine (XYZAL) 5 MG tablet Take 5 mg by mouth every evening.    [provider]  lisinopril-hydrochlorothiazide (PRINZIDE,ZESTORETIC) 20-12.5 MG per tablet Take 1 tablet by mouth in the morning.    [provider]  Magnesium Oxide -Mg Supplement 250 MG TABS Take 250 mg by mouth in the morning.    [provider]  methocarbamol (ROBAXIN) 750 MG tablet Take 750 mg by mouth in the morning and at bedtime.    [provider]  pantoprazole (PROTONIX) 40 MG tablet Take 40 mg by mouth 2 (two) times daily. 04/26/22   [provider]  Probiotic Product (PROBIOTIC DIGESTIVE SUPP PO) Take 1 capsule by mouth in the morning.    [provider]  rosuvastatin (CRESTOR) 40 MG tablet Take 40 mg by mouth in the morning.    [provider]  sitaGLIPtin (JANUVIA) 100 MG tablet Take 100 mg by mouth daily.    [provider]  Tart Cherry 1200 MG CAPS Take 1,200 mg by mouth in the morning.    [provider]  Turmeric 500 MG CAPS Take 500 mg by mouth in the morning. 05/12/23  Iran Sizer, PA-C      Allergies    Topamax [topiramate], Codeine, Metformin, Percocet [oxycodone-acetaminophen], and Tramadol    Review of Systems   Review of Systems  Gastrointestinal:  Positive for abdominal pain, diarrhea and vomiting.    Physical Exam Updated Vital Signs BP 119/67   Pulse 74   Temp 98.3 F (36.8 C) (Temporal)   Resp 18   Ht 5\' 3"  (1.6 m)   Wt 107.5 kg   SpO2 98%   BMI 41.98 kg/m  Physical Exam  ED Results / Procedures / Treatments   Labs (all labs ordered are listed, but only abnormal results are displayed) Labs Reviewed  GASTROINTESTINAL PANEL BY PCR, STOOL (REPLACES STOOL CULTURE)  C DIFFICILE QUICK SCREEN W PCR  REFLEX    CBC WITH DIFFERENTIAL/PLATELET  COMPREHENSIVE METABOLIC PANEL    EKG None  Radiology No results found.  Procedures Procedures    Medications Ordered in ED Medications  sodium chloride 0.9 % bolus 2,000 mL (2,000 mLs Intravenous New Bag/Given 12/01/23 0829)  ketorolac (TORADOL) 30 MG/ML injection 30 mg (30 mg Intravenous Given 12/01/23 0830)    ED Course/ Medical Decision Making/ A&P                                 Medical Decision Making Amount and/or Complexity of Data Reviewed Labs: ordered. Radiology: ordered.  Risk Prescription drug management.           Final Clinical Impression(s) / ED Diagnoses Final diagnoses:  None    Rx / DC Orders ED Discharge Orders     None         Bethann Berkshire, MD 12/03/23 1010

## 2023-12-01 NOTE — ED Notes (Signed)
Pt verbalized understanding of discharge instructions. Opportunity for questions provided.  

## 2023-12-01 NOTE — ED Notes (Signed)
Stool sample walked to the lab.  Sample noted to be fairly formed.

## 2023-12-01 NOTE — ED Triage Notes (Signed)
Pt c/o generalized abdominal pain and n/v/d x3 days.  Pain score 7/10.  Pt reports taking OTC medications w/o relief.

## 2023-12-01 NOTE — ED Provider Notes (Signed)
[facility] EMERGENCY DEPARTMENT AT [facility] Provider Note   CSN: [id number] Arrival date & time: 12/01/23  4098     History  Chief Complaint  Patient presents with   Abdominal Pain   Emesis   Diarrhea    Tamara Solomon is a 52 y.o. female.  Patient complaining of abdominal pain nausea vomiting and diarrhea for the last 3 days.  No blood seen in her diarrhea.  Patient is a history of diabetes and GERD  The history is provided by the patient and medical records. No language interpreter was used.  Abdominal Pain Pain location:  Epigastric Pain quality: aching   Pain radiates to:  Does not radiate Pain severity:  Mild Onset quality:  Sudden Timing:  Constant Progression:  Waxing and waning Chronicity:  New Context: not alcohol use   Relieved by:  Nothing Worsened by:  Nothing Associated symptoms: diarrhea and vomiting   Associated symptoms: no chest pain, no cough, no fatigue and no hematuria   Emesis Associated symptoms: abdominal pain and diarrhea   Associated symptoms: no cough and no headaches   Diarrhea Associated symptoms: abdominal pain and vomiting   Associated symptoms: no headaches        Home Medications Prior to Admission medications   Medication Sig Start Date End Date Taking? Authorizing Provider  dicyclomine (BENTYL) 20 MG tablet Take 1 every 6-8 hours as needed for abdominal cramping 12/01/23  Yes Bethann Berkshire, MD  ondansetron (ZOFRAN-ODT) 4 MG disintegrating tablet 4mg  ODT q4 hours prn nausea/vomit 12/01/23  Yes Bethann Berkshire, MD  ACCU-CHEK AVIVA PLUS test strip  08/26/12   [provider]  ASHWAGANDHA PO Take 3,000 mg by mouth in the morning.    [provider]  Black Currant Seed Oil 500 MG CAPS Take 500 mg by mouth daily. 05/12/23   Iran Sizer, PA-C  Cholecalciferol (VITAMIN D-3 PO) Take 10,000 Units by mouth in the morning.    [provider]  Choline Fenofibrate (TRILIPIX) 135 MG capsule  Take 135 mg by mouth in the morning.    [provider]  Cinnamon 500 MG capsule Take 2 capsules (1,000 mg total) by mouth in the morning. 05/12/23   Cosentino, Talmadge Chad, PA-C  clonazePAM (KLONOPIN) 0.5 MG tablet Take 0.5 mg by mouth 2 (two) times daily.    [provider]  Collagen-Vitamin C (COLLAGEN PLUS VITAMIN C PO) Take 1 capsule by mouth daily.    [provider]  DULoxetine (CYMBALTA) 60 MG capsule Take 60 mg by mouth in the morning.    [provider]  empagliflozin (JARDIANCE) 25 MG TABS tablet Take 25 mg by mouth in the morning.    [provider]  gabapentin (NEURONTIN) 400 MG capsule Take 400 mg by mouth 3 (three) times daily.    [provider]  Ginger, Zingiber officinalis, (GINGER ROOT) 550 MG CAPS Take 550 mg by mouth in the morning.    [provider]  insulin aspart (NOVOLOG FLEXPEN) 100 UNIT/ML FlexPen Inject 30 Units into the skin 2 (two) times daily with breakfast and lunch.    [provider]  Insulin Degludec (TRESIBA FLEXTOUCH) 200 UNIT/ML SOPN Inject 160 Units into the skin in the morning.    [provider]  levocetirizine (XYZAL) 5 MG tablet Take 5 mg by mouth every evening.    [provider]  lisinopril-hydrochlorothiazide (PRINZIDE,ZESTORETIC) 20-12.5 MG per tablet Take 1 tablet by mouth in the morning.    [provider]  Magnesium Oxide -Mg Supplement 250 MG TABS Take 250 mg by mouth in the morning.    [provider]  methocarbamol (ROBAXIN) 750 MG tablet Take 750 mg by mouth in the morning and at bedtime.    [provider]  pantoprazole (PROTONIX) 40 MG tablet Take 40 mg by mouth 2 (two) times daily. 04/26/22   [provider]  Probiotic Product (PROBIOTIC DIGESTIVE SUPP PO) Take 1 capsule by mouth in the morning.    [provider]  rosuvastatin (CRESTOR) 40 MG tablet Take 40 mg by mouth in the morning.    [provider]   sitaGLIPtin (JANUVIA) 100 MG tablet Take 100 mg by mouth daily.    [provider]  Tart Cherry 1200 MG CAPS Take 1,200 mg by mouth in the morning.    [provider]  Turmeric 500 MG CAPS Take 500 mg by mouth in the morning. 05/12/23   Iran Sizer, PA-C      Allergies    Topamax [topiramate], Codeine, Metformin, Percocet [oxycodone-acetaminophen], and Tramadol    Review of Systems   Review of Systems  Constitutional:  Negative for appetite change and fatigue.  HENT:  Negative for congestion, ear discharge and sinus pressure.   Eyes:  Negative for discharge.  Respiratory:  Negative for cough.   Cardiovascular:  Negative for chest pain.  Gastrointestinal:  Positive for abdominal pain, diarrhea and vomiting.  Genitourinary:  Negative for frequency and hematuria.  Musculoskeletal:  Negative for back pain.  Skin:  Negative for rash.  Neurological:  Negative for seizures and headaches.  Psychiatric/Behavioral:  Negative for hallucinations.     Physical Exam Updated Vital Signs BP 119/67   Pulse 74   Temp 98.3 F (36.8 C) (Temporal)   Resp 18   Ht 5\' 3"  (1.6 m)   Wt 107.5 kg   SpO2 98%   BMI 41.98 kg/m  Physical Exam Vitals and nursing note reviewed.  Constitutional:      Appearance: She is well-developed.  HENT:     Head: Normocephalic.     Mouth/Throat:     Mouth: Mucous membranes are moist.  Eyes:     General: No scleral icterus.    Conjunctiva/sclera: Conjunctivae normal.  Neck:     Thyroid: No thyromegaly.  Cardiovascular:     Rate and Rhythm: Normal rate and regular rhythm.     Heart sounds: No murmur heard.    No friction rub. No gallop.  Pulmonary:     Breath sounds: No stridor. No wheezing or rales.  Chest:     Chest wall: No tenderness.  Abdominal:     General: There is no distension.     Tenderness: There is abdominal tenderness. There is no rebound.  Musculoskeletal:        General: Normal range of motion.     Cervical  back: Neck supple.  Lymphadenopathy:     Cervical: No cervical adenopathy.  Skin:    Findings: No erythema or rash.  Neurological:     Mental Status: She is alert and oriented to person, place, and time.     Motor: No abnormal muscle tone.     Coordination: Coordination normal.  Psychiatric:        Behavior: Behavior normal.     ED Results / Procedures / Treatments   Labs (all labs ordered are listed, but only abnormal results are displayed) Labs Reviewed  COMPREHENSIVE METABOLIC PANEL - Abnormal; Notable for the following  components:      Result Value   Sodium 134 (*)    Glucose, Bld 115 (*)    All other components within normal limits  C DIFFICILE QUICK SCREEN W PCR REFLEX    GASTROINTESTINAL PANEL BY PCR, STOOL (REPLACES STOOL CULTURE)  CBC WITH DIFFERENTIAL/PLATELET    EKG None  Radiology DG ABD ACUTE 2+V W 1V CHEST Result Date: 12/01/2023 CLINICAL DATA:  52 year old female with history of chest pain. EXAM: DG ABDOMEN ACUTE WITH 1 VIEW CHEST COMPARISON:  Chest x-ray 11/23/2022. FINDINGS: Lung volumes are normal. No consolidative airspace disease. No pleural effusions. No pneumothorax. No pulmonary nodule or mass noted. Pulmonary vasculature and the cardiomediastinal silhouette are within normal limits. Low orthopedic fixation hardware in the lower cervical spine incidentally noted. General paucity of bowel gas. No pathologic distension of small bowel is noted. No gross evidence of pneumoperitoneum. Surgical clips in the upper abdomen near the gastroesophageal junction and in the right upper quadrant (likely from prior cholecystectomy). Soft tissue anchors from probable surgical mesh projecting over the abdomen incidentally noted. Orthopedic fixation hardware in the lower lumbar spine also incidentally noted. IMPRESSION: 1. Nonobstructive bowel gas pattern. 2. No pneumoperitoneum. 3. Low lung volumes without radiographic evidence of acute cardiopulmonary disease. Electronically  Signed   By: Trudie Reed M.D.   On: 12/01/2023 09:35    Procedures Procedures    Medications Ordered in ED Medications  sodium chloride 0.9 % bolus 2,000 mL (0 mLs Intravenous Stopped 12/01/23 1036)  ketorolac (TORADOL) 30 MG/ML injection 30 mg (30 mg Intravenous Given 12/01/23 0830)    ED Course/ Medical Decision Making/ A&P                                 Medical Decision Making Amount and/or Complexity of Data Reviewed Labs: ordered. Radiology: ordered.  Risk Prescription drug management.  This patient presents to the ED for concern of abdominal pain nausea vomiting diarrhea, this involves an extensive number of treatment options, and is a complaint that carries with it a high risk of complications and morbidity.  The differential diagnosis includes appendicitis, gastritis, gastroenteritis   Co morbidities that complicate the patient evaluation  Diabetes and GERD   Additional history obtained:  Additional history obtained from patient External records from outside source obtained and reviewed including hospital records   Lab Tests:  I Ordered, and personally interpreted labs.  The pertinent results include: Stool study eventually showed astrovirus   Imaging Studies ordered:  I ordered imaging studies including abdominal series I independently visualized and interpreted imaging which showed unremarkable I agree with the radiologist interpretation   Cardiac Monitoring: / EKG:  The patient was maintained on a cardiac monitor.  I personally viewed and interpreted the cardiac monitored which showed an underlying rhythm of: Normal sinus rhythm   Consultations Obtained:  No consultant Problem List / ED Course / Critical interventions / Medication management  Gastroenteritis I ordered medication including Bentyl and Zofran Reevaluation of the patient after these medicines showed that the patient improved I have reviewed the patients home medicines and  have made adjustments as needed   Social Determinants of Health:  None   Test / Admission - Considered:  None  Patient with gastroenteritis.  Most likely viral gastroenteritis.  She is sent home with Zofran and Bentyl and will follow-up with her PCP.  GI labs pending from stool specimen  Final Clinical Impression(s) / ED Diagnoses Final diagnoses:  Gastroenteritis    Rx / DC Orders ED Discharge Orders          Ordered    ondansetron (ZOFRAN-ODT) 4 MG disintegrating tablet        12/01/23 1126    dicyclomine (BENTYL) 20 MG tablet        12/01/23 1126              Bethann Berkshire, MD 12/03/23 1009

## 2023-12-01 NOTE — Discharge Instructions (Signed)
Drink plenty of liquids.  You can add Tylenol or Motrin as needed for pain if the Bentyl is not helping.  Follow-up with your family doctor next week if not improving or get seen sooner if getting worse

## 2023-12-02 LAB — GASTROINTESTINAL PANEL BY PCR, STOOL (REPLACES STOOL CULTURE)

## 2024-05-09 ENCOUNTER — Other Ambulatory Visit: Payer: Self-pay | Admitting: Neurosurgery

## 2024-05-09 DIAGNOSIS — M542 Cervicalgia: Secondary | ICD-10-CM

## 2024-05-20 ENCOUNTER — Ambulatory Visit
Admission: RE | Admit: 2024-05-20 | Discharge: 2024-05-20 | Disposition: A | Discharge status: Home / Self Care | Payer: Self-pay | Source: Ambulatory Visit | Attending: Neurosurgery | Admitting: Neurosurgery

## 2024-05-20 DIAGNOSIS — M542 Cervicalgia: Secondary | ICD-10-CM

## 2024-09-15 ENCOUNTER — Other Ambulatory Visit (HOSPITAL_COMMUNITY): Payer: Self-pay | Admitting: Internal Medicine

## 2024-09-15 DIAGNOSIS — R200 Anesthesia of skin: Secondary | ICD-10-CM

## 2024-09-15 DIAGNOSIS — M25512 Pain in left shoulder: Secondary | ICD-10-CM

## 2024-09-18 ENCOUNTER — Encounter (HOSPITAL_COMMUNITY): Payer: Self-pay | Admitting: Internal Medicine

## 2024-09-20 ENCOUNTER — Encounter (INDEPENDENT_AMBULATORY_CARE_PROVIDER_SITE_OTHER): Payer: Self-pay | Admitting: Gastroenterology

## 2024-09-26 ENCOUNTER — Ambulatory Visit (HOSPITAL_COMMUNITY)
Admission: RE | Admit: 2024-09-26 | Discharge: 2024-09-26 | Disposition: A | Discharge status: Home / Self Care | Source: Ambulatory Visit | Attending: Internal Medicine | Admitting: Internal Medicine

## 2024-09-26 DIAGNOSIS — R200 Anesthesia of skin: Secondary | ICD-10-CM | POA: Diagnosis present

## 2024-09-28 ENCOUNTER — Encounter (INDEPENDENT_AMBULATORY_CARE_PROVIDER_SITE_OTHER): Admitting: Gastroenterology

## 2024-10-01 ENCOUNTER — Inpatient Hospital Stay
Admission: RE | Admit: 2024-10-01 | Discharge: 2024-10-01 | Disposition: A | Source: Ambulatory Visit | Attending: Internal Medicine | Admitting: Internal Medicine

## 2024-10-01 DIAGNOSIS — M25512 Pain in left shoulder: Secondary | ICD-10-CM

## 2024-10-23 ENCOUNTER — Ambulatory Visit (INDEPENDENT_AMBULATORY_CARE_PROVIDER_SITE_OTHER): Admitting: Gastroenterology

## 2024-10-23 ENCOUNTER — Encounter (INDEPENDENT_AMBULATORY_CARE_PROVIDER_SITE_OTHER): Payer: Self-pay | Admitting: Gastroenterology

## 2024-10-23 VITALS — BP 128/71 | HR 74 | Temp 97.1°F | Ht 63.0 in | Wt 241.7 lb

## 2024-10-23 DIAGNOSIS — R197 Diarrhea, unspecified: Secondary | ICD-10-CM | POA: Diagnosis not present

## 2024-10-23 NOTE — Patient Instructions (Addendum)
 Perform blood and stool workup Can take Imodium as needed 2 mg every 2 hours up to 6 times per day if presenting persistent diarrhea

## 2024-10-23 NOTE — Progress Notes (Unsigned)
 Toribio Fortune, M.D. Gastroenterology & Hepatology [facility] [address] [place], [place] [address]  Primary Care Physician: Shona Norleen PEDLAR, MD [address] [place] [address]  I will communicate my assessment and recommendations to the referring MD via EMR.  Problems: Intermittent diarrhea. 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  History of Present Illness: Tamara Solomon is a 53 y.o. female with past medical history of anxiety, depression, diabetes, GERD, hyperlipidemia, hypertension, IBS, focal nodular hyperplasia of the liver status post resection, who presents for evaluation of diarrhea.  Patient was previously seen by Dr. Golda for issues with intermittent diarrhea.  Patient was previously prescribed dicyclomine  10 mg as needed for abdominal pain and ciprofloxacin  prescription for possible SIBO.  The patient was also being followed by him for lesions in the liver that were concerning for focal nodular hyperplasia, less likely benign adenomas. Underwent eventual resection of L side of the liver.  Patient reports that since August 2025 she has presented recurrent issues with intermittent diarrhea. States at least twice a week she is having runs of diarrhea, which usually starts in the very early morning, waking her up from her sleep.  She can have up to 8 Bms per day - stools are watery and yellow. No melena or hematochezia. Has a had a lot of rumbling in her abdomen prior to having a BM. States she only had abdominal pain once. She has had fecal soiling due to urgency x3 at work. She has tried taking Peptobismol but was switched to Imodium by her PCP. She has recurrent issues with nausea.   The patient denies having any vomiting, fever, chills, hematochezia, melena, hematemesis, abdominal distention, abdominal pain,jaundice, pruritus. No recent changes in weight.   Last Colonoscopy:  07/16/2022 - Diverticulosis in the sigmoid colon and in the descending colon. - The distal rectum and anal verge are normal on retroflexion view.  Past Medical History: Past Medical History:  Diagnosis Date   Anxiety    Arthritis    lumbar stenosis   Complication of anesthesia    Depression    Diabetes mellitus    Family history of adverse reaction to anesthesia    family history of N&V   GERD (gastroesophageal reflux disease)    Hyperlipidemia    Hypertension    Irritable bowel syndrome    PONV (postoperative nausea and vomiting)     Past Surgical History: Past Surgical History:  Procedure Laterality Date   ANTERIOR CERVICAL DECOMPRESSION/DISCECTOMY FUSION 4 LEVELS N/A 05/04/2023   Procedure: Cervical Four to Cervical Seven Anterior Cervical Discectomy and Fusion;  Surgeon: Cheryle Debby LABOR, MD;  Location: [facility] OR;  Service: Neurosurgery;  Laterality: N/A;  RM 21 3C   CARPAL TUNNEL RELEASE     Patient has had three times - 2 times right hand, one time to the left   CERVICAL CONE BIOPSY  2009   CHOLECYSTECTOMY  1997   COLONOSCOPY     COLONOSCOPY WITH PROPOFOL  N/A 07/16/2022   Procedure: COLONOSCOPY WITH PROPOFOL ;  Surgeon: Fortune Angelia Toribio, MD;  Location: [facility];  Service: Gastroenterology;  Laterality: N/A;  1055 ASA 2   DILATION AND CURETTAGE OF UTERUS  2009   HERNIA REPAIR  2004   Incisional Hernia from Liver Resection   LIVER SURGERY  2000   Liver resection   NM MYOCAR PERF WALL MOTION  02/16/2012   low risk study.  Small area  of breast attenuation   POSTERIOR CERVICAL LAMINECTOMY WITH MET- RX Right 08/16/2023   Procedure: Right Cervical seven-Thoracic one minimally invasive foraminotomy with o-arm and navigation;  Surgeon: Cheryle Debby LABOR, MD;  Location: [facility] OR;  Service: Neurosurgery;  Laterality: Right;   TONSILLECTOMY  1978   TRIGGER FINGER RELEASE Bilateral    several times   ULNAR NERVE TRANSPOSITION Right 11/02/2023   Procedure: Right elbow  ulnar nerve decompression and anterior transposition with flexor pronator lengthening and medial epicondyle debridement and repair as necessary;  Surgeon: Camella Fallow, MD;  Location: [facility] OR;  Service: Orthopedics;  Laterality: Right;  90 MIN   UPPER GASTROINTESTINAL ENDOSCOPY     US  ECHOCARDIOGRAPHY  02/16/2012   mild concentric LVH, EF >55%    Family History: Family History  Problem Relation Age of Onset   Healthy Mother    Liver cancer Father    Healthy Son    Diabetes Brother     Social History: Social History   Tobacco Use  Smoking Status Never  Smokeless Tobacco Never   Social History   Substance and Sexual Activity  Alcohol Use No   Social History   Substance and Sexual Activity  Drug Use No    Allergies: Allergies  Allergen Reactions   Topamax [Topiramate] Swelling    Angioedema   Codeine Nausea And Vomiting and Rash   Metformin Nausea And Vomiting   Percocet [Oxycodone -Acetaminophen ] Nausea And Vomiting   Tramadol Nausea And Vomiting    Medications: Current Outpatient Medications  Medication Sig Dispense Refill   ACCU-CHEK AVIVA PLUS test strip      ASHWAGANDHA PO Take 3,000 mg by mouth in the morning.     celecoxib (CELEBREX) 200 MG capsule Take 200 mg by mouth daily.     Cholecalciferol (VITAMIN D-3 PO) Take 10,000 Units by mouth in the morning.     Choline Fenofibrate  (TRILIPIX) 135 MG capsule Take 135 mg by mouth in the morning.     Cinnamon 500 MG capsule Take 2 capsules (1,000 mg total) by mouth in the morning.     clonazePAM  (KLONOPIN ) 0.5 MG tablet Take 0.5 mg by mouth 2 (two) times daily.     dicyclomine  (BENTYL ) 20 MG tablet Take 1 every 6-8 hours as needed for abdominal cramping 20 tablet 0   DULoxetine  (CYMBALTA ) 60 MG capsule Take 60 mg by mouth in the morning.     empagliflozin  (JARDIANCE ) 25 MG TABS tablet Take 25 mg by mouth in the morning.     gabapentin  (NEURONTIN ) 400 MG capsule Take 400 mg by mouth 3 (three) times daily.      insulin  aspart (NOVOLOG  FLEXPEN) 100 UNIT/ML FlexPen Inject 30 Units into the skin 2 (two) times daily with breakfast and lunch.     Insulin  Degludec (TRESIBA  FLEXTOUCH) 200 UNIT/ML SOPN Inject 160 Units into the skin in the morning.     levocetirizine (XYZAL) 5 MG tablet Take 5 mg by mouth every evening.     lisinopril -hydrochlorothiazide  (PRINZIDE ,ZESTORETIC ) 20-12.5 MG per tablet Take 1 tablet by mouth in the morning.     meloxicam (MOBIC) 15 MG tablet Take 15 mg by mouth daily.     methocarbamol  (ROBAXIN ) 750 MG tablet Take 750 mg by mouth in the morning and at bedtime.     ondansetron  (ZOFRAN -ODT) 4 MG disintegrating tablet 4mg  ODT q4 hours prn nausea/vomit 10 tablet 0   OVER THE COUNTER MEDICATION Magnesium  complex once per day. Saffron advanced daily. Mary Ruths 3 in 1 probiotic  Apple cider vinegar 2400 mg daily.     pantoprazole  (PROTONIX ) 40 MG tablet Take 40 mg by mouth 2 (two) times daily.     rosuvastatin  (CRESTOR ) 40 MG tablet Take 40 mg by mouth in the morning.     sitaGLIPtin (JANUVIA) 100 MG tablet Take 100 mg by mouth daily.     Turmeric 500 MG CAPS Take 500 mg by mouth in the morning.     No current facility-administered medications for this visit.    Review of Systems: GENERAL: negative for malaise, night sweats HEENT: No changes in hearing or vision, no nose bleeds or other nasal problems. NECK: Negative for lumps, goiter, pain and significant neck swelling RESPIRATORY: Negative for cough, wheezing CARDIOVASCULAR: Negative for chest pain, leg swelling, palpitations, orthopnea GI: SEE HPI MUSCULOSKELETAL: Negative for joint pain or swelling, back pain, and muscle pain. SKIN: Negative for lesions, rash PSYCH: Negative for sleep disturbance, mood disorder and recent psychosocial stressors. HEMATOLOGY Negative for prolonged bleeding, bruising easily, and swollen nodes. ENDOCRINE: Negative for cold or heat intolerance, polyuria, polydipsia and goiter. NEURO: negative  for tremor, gait imbalance, syncope and seizures. The remainder of the review of systems is noncontributory.   Physical Exam: BP 128/71 (BP Location: Left Arm, Patient Position: Sitting, Cuff Size: Normal)   Pulse 74   Temp (!) 97.1 F (36.2 C) (Temporal)   Ht 5' 3 (1.6 m)   Wt 241 lb 11.2 oz (109.6 kg)   BMI 42.82 kg/m  GENERAL: The patient is AO x3, in no acute distress. HEENT: Head is normocephalic and atraumatic. EOMI are intact. Mouth is well hydrated and without lesions. NECK: Supple. No masses LUNGS: Clear to auscultation. No presence of rhonchi/wheezing/rales. Adequate chest expansion HEART: RRR, normal s1 and s2. ABDOMEN: Soft, nontender, no guarding, no peritoneal signs, and nondistended. BS +. No masses. EXTREMITIES: Without any cyanosis, clubbing, rash, lesions or edema. NEUROLOGIC: AOx3, no focal motor deficit. SKIN: no jaundice, no rashes  Imaging/Labs: as above  I personally reviewed and interpreted the available labs, imaging and endoscopic files.  Impression and Plan: CLAIRISSA VALVANO is a 53 y.o. female with past medical history of anxiety, depression, diabetes, GERD, hyperlipidemia, hypertension, IBS, focal nodular hyperplasia of the liver status post resection, who presents for evaluation of diarrhea.  Patient has presented fluctuation in bowel movements which have been concerning to her as she has been recurrent diarrhea issues.  It is not clear why she has presented this.  It appears she had similar symptoms many years ago when she was being seen by Dr. Golda.  At this point, I will favor evaluating ovarian etiologies with serology and stool based testing.  May need to consider an eventual repeat colonoscopy with random biopsies if negative and possible SIBO breath testing.  For now, she can keep taking Imodium as needed.  - Check CBC, CMP, CRP, celiac disease panel, fecal calprotectin, pancreatic elastase/fat in stool, GI pathogen panel and C. difficile in  stool - Can take Imodium as needed 2 mg every 2 hours up to 6 times per day if presenting persistent diarrhea  All questions were answered.      Toribio Fortune, MD Gastroenterology and Hepatology [facility]

## 2024-10-24 LAB — CBC WITH DIFFERENTIAL/PLATELET
Absolute Lymphocytes: 3905 {cells}/uL — ABNORMAL HIGH (ref 850–3900)
Absolute Monocytes: 713 {cells}/uL (ref 200–950)
Basophils Absolute: 67 {cells}/uL (ref 0–200)
Basophils Relative: 0.7 %
Eosinophils Absolute: 304 {cells}/uL (ref 15–500)
Eosinophils Relative: 3.2 %
HCT: 43.1 % (ref 35.0–45.0)
Hemoglobin: 14 g/dL (ref 11.7–15.5)
MCH: 28.6 pg (ref 27.0–33.0)
MCHC: 32.5 g/dL (ref 32.0–36.0)
MCV: 88 fL (ref 80.0–100.0)
MPV: 9.9 fL (ref 7.5–12.5)
Monocytes Relative: 7.5 %
Neutro Abs: 4513 {cells}/uL (ref 1500–7800)
Neutrophils Relative %: 47.5 %
Platelets: 299 Thousand/uL (ref 140–400)
RBC: 4.9 Million/uL (ref 3.80–5.10)
RDW: 12 % (ref 11.0–15.0)
Total Lymphocyte: 41.1 %
WBC: 9.5 Thousand/uL (ref 3.8–10.8)

## 2024-10-24 LAB — COMPREHENSIVE METABOLIC PANEL WITH GFR
AG Ratio: 1.8 (calc) (ref 1.0–2.5)
ALT: 17 U/L (ref 6–29)
AST: 22 U/L (ref 10–35)
Albumin: 4.2 g/dL (ref 3.6–5.1)
Alkaline phosphatase (APISO): 73 U/L (ref 37–153)
BUN/Creatinine Ratio: 18 (calc) (ref 6–22)
BUN: 20 mg/dL (ref 7–25)
CO2: 31 mmol/L (ref 20–32)
Calcium: 10.1 mg/dL (ref 8.6–10.4)
Chloride: 99 mmol/L (ref 98–110)
Creat: 1.09 mg/dL — ABNORMAL HIGH (ref 0.50–1.03)
Globulin: 2.3 g/dL (ref 1.9–3.7)
Glucose, Bld: 224 mg/dL — ABNORMAL HIGH (ref 65–139)
Potassium: 4 mmol/L (ref 3.5–5.3)
Sodium: 137 mmol/L (ref 135–146)
Total Bilirubin: 0.3 mg/dL (ref 0.2–1.2)
Total Protein: 6.5 g/dL (ref 6.1–8.1)
eGFR: 61 mL/min/1.73m2 (ref >=60)

## 2024-10-24 LAB — CELIAC DISEASE PANEL
(tTG) Ab, IgA: <1 U/mL
(tTG) Ab, IgG: <1 U/mL
Deamidated Gliadin Abs, IgG: <1 U/mL
Gliadin IgA: <1 U/mL
Immunoglobulin A: 290 mg/dL (ref 47–310)

## 2024-10-24 LAB — C-REACTIVE PROTEIN: CRP: 8.1 mg/L — ABNORMAL HIGH (ref <8.0)

## 2024-11-01 ENCOUNTER — Telehealth (INDEPENDENT_AMBULATORY_CARE_PROVIDER_SITE_OTHER): Payer: Self-pay | Admitting: Gastroenterology

## 2024-11-01 ENCOUNTER — Other Ambulatory Visit (INDEPENDENT_AMBULATORY_CARE_PROVIDER_SITE_OTHER): Payer: Self-pay | Admitting: Gastroenterology

## 2024-11-01 ENCOUNTER — Encounter (INDEPENDENT_AMBULATORY_CARE_PROVIDER_SITE_OTHER): Payer: Self-pay

## 2024-11-01 DIAGNOSIS — R197 Diarrhea, unspecified: Secondary | ICD-10-CM

## 2024-11-01 NOTE — Telephone Encounter (Signed)
 Left message to return call. Also sent my chart message. GI pathogen panel reordered for [facility]

## 2024-11-01 NOTE — Telephone Encounter (Signed)
 Please let her know her CRP is elevated, CBC, CMP, celiac disease panel, C. difficile and stool and fecal fat were normal.  However, we are still waiting for the results of the fecal calprotectin and fecal elastase, we will get back to her once the results are back.  Her GI pathogen panel was canceled as she incorrectly filled the tube.  Please send her a repeat order for GI pathogen panel and asked her to collect the sample again.  She needs to ask the staff in the lab how to collect it.

## 2024-11-01 NOTE — Telephone Encounter (Signed)
 Pt returned call and verbalized understanding. Pt will come by office to pick up lab order and go to [facility] to get sample container

## 2024-11-01 NOTE — Addendum Note (Signed)
 Addended by: Feras Gardella on: 11/01/2024 01:08 PM   Modules accepted: Orders

## 2024-11-01 NOTE — Telephone Encounter (Signed)
 Pt left message in regards to blood work/stool sample results. Pt had office visit on 10/23/24. Please advise. Thank you.

## 2024-11-02 ENCOUNTER — Ambulatory Visit (INDEPENDENT_AMBULATORY_CARE_PROVIDER_SITE_OTHER): Payer: Self-pay | Admitting: Gastroenterology

## 2024-11-02 DIAGNOSIS — K8681 Exocrine pancreatic insufficiency: Secondary | ICD-10-CM

## 2024-11-02 LAB — C. DIFFICILE GDH AND TOXIN A/B
GDH ANTIGEN: NOT DETECTED
MICRO NUMBER:: 17265078
SPECIMEN QUALITY:: ADEQUATE
TOXIN A AND B: NOT DETECTED

## 2024-11-02 LAB — GASTROINTESTINAL PATHOGEN PNL

## 2024-11-02 LAB — FECAL FAT, QUALITATIVE: FECAL FAT, QUALITATIVE: NORMAL

## 2024-11-02 LAB — CALPROTECTIN: Calprotectin: 64 ug/g

## 2024-11-02 LAB — PANCREATIC ELASTASE, FECAL: Pancreatic Elastase-1, Stool: 26 ug/g — ABNORMAL LOW (ref >200)

## 2024-11-06 MED ORDER — PANCRELIPASE (LIP-PROT-AMYL) 36000-114000 UNITS PO CPEP: ORAL_CAPSULE | ORAL | 11 refills | Status: AC

## 2024-11-25 LAB — GASTROINTESTINAL PATHOGEN PNL
CampyloBacter Group: NOT DETECTED
Norovirus GI/GII: NOT DETECTED
Rotavirus A: NOT DETECTED
Salmonella species: NOT DETECTED
Shiga Toxin 1: NOT DETECTED
Shiga Toxin 2: NOT DETECTED
Shigella Species: NOT DETECTED
Vibrio Group: NOT DETECTED
Yersinia enterocolitica: NOT DETECTED

## 2024-11-27 ENCOUNTER — Ambulatory Visit (INDEPENDENT_AMBULATORY_CARE_PROVIDER_SITE_OTHER): Payer: Self-pay | Admitting: Gastroenterology

## 2024-11-27 ENCOUNTER — Telehealth (INDEPENDENT_AMBULATORY_CARE_PROVIDER_SITE_OTHER): Payer: Self-pay | Admitting: Gastroenterology

## 2024-11-27 DIAGNOSIS — K580 Irritable bowel syndrome with diarrhea: Secondary | ICD-10-CM

## 2024-11-27 MED ORDER — RIFAXIMIN 550 MG PO TABS: 550.0000 mg | ORAL_TABLET | Freq: Three times a day (TID) | ORAL | 0 refills | Status: AC

## 2024-11-27 NOTE — Telephone Encounter (Signed)
 Pt left voicemail stating that she is having pain stomach. Taking Creon  with meals. When drinking water her stomach burns. Still learning what foods she can eat due to pancrease. Pt wanted to know about Bentyl  and if that was ok to take with Creon . Returned call to patient. Pt states she is having pain in middle of abdomen. She is trying to watch what she eats but states the only way you can tell if the food is going to affect stomach is to eat one food at a time. Pt states she has burning in her stomach even with just water. Pt states she had Bentyl  last year with norovirus and that helped with stomach pain. Pt is wondering if she could get a prescription for Bentyl  20 mg sent to [facility] if it is ok to take with Creon . Pt also states she saw where there is a Food Tolerance Test that can help tell what food patients can eat with pancreatitis. She is aware that this is an out of pocket cost. Pt would like recommendations on what to do. Please advise. Thank you!

## 2024-11-27 NOTE — Telephone Encounter (Signed)
 Noted

## 2024-11-27 NOTE — Telephone Encounter (Signed)
 Spoke with patient and advised that she cannot take Creon  with other medications.  I addressed the results of the GI pathogen panel in a different phone/result encounter

## 2024-12-27 ENCOUNTER — Ambulatory Visit (INDEPENDENT_AMBULATORY_CARE_PROVIDER_SITE_OTHER): Admitting: Gastroenterology
